# Patient Record
Sex: Male | Born: 2000 | Race: Black or African American | Hispanic: No | Marital: Single | Smoking: Current every day smoker
Health system: Southern US, Community
[De-identification: ages and names within clinical notes are randomized; demographics above are authoritative.]

## PROBLEM LIST (undated history)

## (undated) DIAGNOSIS — F32A Depression, unspecified: Secondary | ICD-10-CM

## (undated) DIAGNOSIS — F29 Unspecified psychosis not due to a substance or known physiological condition: Secondary | ICD-10-CM

## (undated) DIAGNOSIS — Z046 Encounter for general psychiatric examination, requested by authority: Secondary | ICD-10-CM

## (undated) DIAGNOSIS — Z789 Other specified health status: Secondary | ICD-10-CM

## (undated) DIAGNOSIS — I10 Essential (primary) hypertension: Secondary | ICD-10-CM

## (undated) DIAGNOSIS — F909 Attention-deficit hyperactivity disorder, unspecified type: Secondary | ICD-10-CM

## (undated) HISTORY — PX: NO PAST SURGERIES: SHX2092

---
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
## 2018-08-01 ENCOUNTER — Other Ambulatory Visit: Payer: Self-pay

## 2018-08-01 ENCOUNTER — Encounter (HOSPITAL_COMMUNITY): Payer: Self-pay | Admitting: Emergency Medicine

## 2018-08-01 ENCOUNTER — Emergency Department (HOSPITAL_COMMUNITY)
Admission: EM | Admit: 2018-08-01 | Discharge: 2018-08-02 | Disposition: A | Discharge status: Home / Self Care | Payer: Medicaid Other | Attending: Emergency Medicine | Admitting: Emergency Medicine

## 2018-08-01 DIAGNOSIS — F17210 Nicotine dependence, cigarettes, uncomplicated: Secondary | ICD-10-CM | POA: Diagnosis not present

## 2018-08-01 DIAGNOSIS — F29 Unspecified psychosis not due to a substance or known physiological condition: Secondary | ICD-10-CM | POA: Insufficient documentation

## 2018-08-01 DIAGNOSIS — F333 Major depressive disorder, recurrent, severe with psychotic symptoms: Secondary | ICD-10-CM | POA: Diagnosis not present

## 2018-08-01 DIAGNOSIS — F909 Attention-deficit hyperactivity disorder, unspecified type: Secondary | ICD-10-CM | POA: Insufficient documentation

## 2018-08-01 DIAGNOSIS — F4325 Adjustment disorder with mixed disturbance of emotions and conduct: Secondary | ICD-10-CM | POA: Diagnosis present

## 2018-08-01 DIAGNOSIS — R4182 Altered mental status, unspecified: Secondary | ICD-10-CM | POA: Diagnosis present

## 2018-08-01 DIAGNOSIS — E876 Hypokalemia: Secondary | ICD-10-CM | POA: Diagnosis not present

## 2018-08-01 DIAGNOSIS — F1220 Cannabis dependence, uncomplicated: Secondary | ICD-10-CM | POA: Diagnosis not present

## 2018-08-01 DIAGNOSIS — Z634 Disappearance and death of family member: Secondary | ICD-10-CM | POA: Diagnosis not present

## 2018-08-01 LAB — RAPID URINE DRUG SCREEN, HOSP PERFORMED
Amphetamines: NOT DETECTED
BENZODIAZEPINES: NOT DETECTED
Barbiturates: NOT DETECTED
Cocaine: NOT DETECTED
Opiates: NOT DETECTED
Tetrahydrocannabinol: POSITIVE — AB

## 2018-08-01 LAB — CBC WITH DIFFERENTIAL/PLATELET
Basophils Absolute: 0 10*3/uL (ref 0.0–0.1)
Basophils Relative: 0 %
EOS ABS: 0 10*3/uL (ref 0.0–0.7)
EOS PCT: 0 %
HCT: 43 % (ref 39.0–52.0)
Hemoglobin: 14.7 g/dL (ref 13.0–17.0)
LYMPHS ABS: 0.8 10*3/uL (ref 0.7–4.0)
Lymphocytes Relative: 13 %
MCH: 29.6 pg (ref 26.0–34.0)
MCHC: 34.2 g/dL (ref 30.0–36.0)
MCV: 86.5 fL (ref 78.0–100.0)
Monocytes Absolute: 0.6 10*3/uL (ref 0.1–1.0)
Monocytes Relative: 10 %
Neutro Abs: 4.4 10*3/uL (ref 1.7–7.7)
Neutrophils Relative %: 77 %
PLATELETS: 185 10*3/uL (ref 150–400)
RBC: 4.97 MIL/uL (ref 4.22–5.81)
RDW: 12.7 % (ref 11.5–15.5)
WBC: 5.8 10*3/uL (ref 4.0–10.5)

## 2018-08-01 LAB — COMPREHENSIVE METABOLIC PANEL
ALT: 22 U/L (ref 0–44)
AST: 31 U/L (ref 15–41)
Albumin: 4.6 g/dL (ref 3.5–5.0)
Alkaline Phosphatase: 50 U/L (ref 38–126)
Anion gap: 16 — ABNORMAL HIGH (ref 5–15)
BUN: 11 mg/dL (ref 6–20)
CHLORIDE: 100 mmol/L (ref 98–111)
CO2: 23 mmol/L (ref 22–32)
CREATININE: 1.34 mg/dL — AB (ref 0.61–1.24)
Calcium: 9.3 mg/dL (ref 8.9–10.3)
GFR calc Af Amer: >60 mL/min (ref >60)
Glucose, Bld: 109 mg/dL — ABNORMAL HIGH (ref 70–99)
Potassium: 2.6 mmol/L — CL (ref 3.5–5.1)
Sodium: 139 mmol/L (ref 135–145)
Total Bilirubin: 1.1 mg/dL (ref 0.3–1.2)
Total Protein: 7.8 g/dL (ref 6.5–8.1)

## 2018-08-01 LAB — ETHANOL

## 2018-08-01 MED ORDER — LORAZEPAM 2 MG/ML IJ SOLN
2.0000 mg | Freq: Once | INTRAMUSCULAR | Status: DC
  Administered 2018-08-01: 2 mg via INTRAMUSCULAR
  Filled 2018-08-01: qty 1

## 2018-08-01 MED ORDER — POTASSIUM CHLORIDE CRYS ER 20 MEQ PO TBCR
40.0000 meq | EXTENDED_RELEASE_TABLET | Freq: Two times a day (BID) | ORAL | Status: DC
  Administered 2018-08-02: 40 meq via ORAL
  Filled 2018-08-01: qty 2

## 2018-08-01 MED ORDER — LORAZEPAM 2 MG/ML IJ SOLN: 1.0000 mg | Freq: Once | INTRAMUSCULAR | Status: DC

## 2018-08-01 NOTE — ED Notes (Signed)
Patient wanded by security. 
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
## 2018-08-01 NOTE — ED Notes (Signed)
Bed: ZMO29 Expected date:  Expected time:  Means of arrival:  Comments: [place]ucker

## 2018-08-01 NOTE — ED Triage Notes (Signed)
Patient here from home with complaints of bizarre behavior after going to the viewing of both of his brothers. Hx of same after smoking "some bad weed". Patient not making sense. Flight of ideas.
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
## 2018-08-01 NOTE — ED Notes (Signed)
Pt sleeping and unable to take medication at this time. Will give medication when pt wakes.

## 2018-08-01 NOTE — ED Notes (Signed)
Bed: [facility] Expected date:  Expected time:  Means of arrival:  Comments: 

## 2018-08-01 NOTE — ED Notes (Signed)
SBAR Report received from previous nurse. Pt received calm and visible on unit. Pt denies current SI/ HI, A/V H, anxiety, or pain at this time, and appears otherwise stable and free of distress. Pt showing blunted affect. Pt reminded of camera surveillance, q 15 min rounds, and rules of the milieu. Will continue to assess.

## 2018-08-02 DIAGNOSIS — F4325 Adjustment disorder with mixed disturbance of emotions and conduct: Secondary | ICD-10-CM

## 2018-08-02 DIAGNOSIS — F17210 Nicotine dependence, cigarettes, uncomplicated: Secondary | ICD-10-CM

## 2018-08-02 DIAGNOSIS — Z634 Disappearance and death of family member: Secondary | ICD-10-CM

## 2018-08-02 HISTORY — DX: Adjustment disorder with mixed disturbance of emotions and conduct: F43.25

## 2018-08-02 NOTE — Consult Note (Addendum)
[facility] Psych ED Discharge  08/02/2018 10:58 AM Gabriel Nelson  MRN:  [id number] Principal Problem: Adjustment disorder with mixed disturbance of emotions and conduct Discharge Diagnoses:  Patient Active Problem List   Diagnosis Date Noted  . Adjustment disorder with mixed disturbance of emotions and conduct [F43.25] 08/02/2018    Priority: High  . Rhabdomyolysis [M62.82] 10/27/2016  . Marijuana intoxication ([place]) [F12.929] 10/27/2016  . Osteomyelitis [facility]) [M86.9] 10/23/2015    Subjective: 17 yo male who came to the ED after not sleeping since his brother was killed 5 days ago and having bizarre behaviors.  Last night his slept and his symptoms resolved, no psychosis.  NO suicidal/homicidal ideations or substance abuse except cannabis.  Met with Peer support.  Stable for discharge.  Total Time spent with patient: 45 minutes  Past Psychiatric History: ADHD  Past Medical History:  Past Medical History:  Diagnosis Date  . ADHD    History reviewed. No pertinent surgical history. Family History: No family history on file. Family Psychiatric  History: none Social History:  Social History   Substance and Sexual Activity  Alcohol Use No    Social History   Substance and Sexual Activity  Drug Use No   Social History   Socioeconomic History  . Marital status: Single    Spouse name: Not on file  . Number of children: Not on file  . Years of education: Not on file  . Highest education level: Not on file  Occupational History  . Not on file  Social Needs  . Financial resource strain: Not on file  . Food insecurity:    Worry: Not on file    Inability: Not on file  . Transportation needs:    Medical: Not on file    Non-medical: Not on file  Tobacco Use  . Smoking status: Current Every Day Smoker    Packs/day: 0.25    Years: 4.00    Pack years: 1.00    Types: Cigarettes  . Smokeless tobacco: Never Used  Substance and Sexual Activity  . Alcohol use: No  . Drug use: No  .  Sexual activity: Not on file    Comment: uta, patient not fully oriented  Lifestyle  . Physical activity:    Days per week: Not on file    Minutes per session: Not on file  . Stress: Not on file  Relationships  . Social connections:    Talks on phone: Not on file    Gets together: Not on file    Attends religious service: Not on file    Active member of club or organization: Not on file    Attends meetings of clubs or organizations: Not on file    Relationship status: Not on file  Other Topics Concern  . Not on file  Social History Narrative  . Not on file    Has this patient used any form of tobacco in the last 30 days? (Cigarettes, Smokeless Tobacco, Cigars, and/or Pipes) Denies  Current Medications: Current Facility-Administered Medications  Medication Dose Route Frequency Provider Last Rate Last Dose  . potassium chloride SA (K-DUR,KLOR-CON) CR tablet 40 mEq  40 mEq Oral BID Davonna Belling, MD   40 mEq at 08/02/18 [id number]   Current Outpatient Medications  Medication Sig Dispense Refill  . diphenhydrAMINE (BENADRYL) 25 mg capsule Take 50 mg by mouth every 6 (six) hours as needed.     PTA Medications:  (Not in a hospital admission)  Musculoskeletal: Strength & Muscle Tone: within  normal limits Gait & Station: normal Patient leans: N/A  Psychiatric Specialty Exam: Physical Exam  Nursing note and vitals reviewed. Constitutional: He is oriented to person, place, and time. He appears well-developed and well-nourished.  HENT:  Head: Normocephalic.  Neck: Normal range of motion.  Respiratory: Effort normal.  Musculoskeletal: Normal range of motion.  Neurological: He is alert and oriented to person, place, and time.  Psychiatric: He has a normal mood and affect. His speech is normal and behavior is normal. Judgment and thought content normal. Cognition and memory are normal.    Review of Systems  Psychiatric/Behavioral: Positive for substance abuse.  All other  systems reviewed and are negative.   Blood pressure (!) 143/93, pulse (!) 54, temperature 98.2 F (36.8 C), temperature source Oral, resp. rate 18, SpO2 99 %.There is no height or weight on file to calculate BMI.  General Appearance: Casual  Eye Contact:  Good  Speech:  Normal Rate  Volume:  Normal  Mood:  Euthymic  Affect:  Congruent  Thought Process:  Coherent and Descriptions of Associations: Intact  Orientation:  Full (Time, Place, and Person)  Thought Content:  WDL and Logical  Suicidal Thoughts:  No  Homicidal Thoughts:  No  Memory:  Immediate;   Good Recent;   Good Remote;   Good  Judgement:  Fair  Insight:  Good  Psychomotor Activity:  Normal  Concentration:  Concentration: Good and Attention Span: Good  Recall:  Good  Fund of Knowledge:  Good  Language:  Good  Akathisia:  No  Handed:  Right  AIMS (if indicated):     Assets:  Housing Leisure Time Physical Health Resilience Social Support  ADL's:  Intact  Cognition:  WNL  Sleep:        Demographic Factors:  Male and Adolescent or young adult  Loss Factors: NA  Historical Factors: NA  Risk Reduction Factors:   Sense of responsibility to family, Living with another person, especially a relative and Positive social support  Continued Clinical Symptoms:  None   Cognitive Features That Contribute To Risk:  None    Suicide Risk:  Minimal: No identifiable suicidal ideation.  Patients presenting with no risk factors but with morbid ruminations; may be classified as minimal risk based on the severity of the depressive symptoms    Plan Of Care/Follow-up recommendations:  Activity:  as tolerated Diet:  heart healthy diet  Disposition: discharge home Waylan Boga, NP 08/02/2018, 10:58 AM  Patient seen face-to-face for psychiatric evaluation, chart reviewed and case discussed with the physician extender and developed treatment plan. Reviewed the information documented and agree with the treatment  plan. Corena Pilgrim, MD
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
## 2018-08-04 ENCOUNTER — Encounter (HOSPITAL_COMMUNITY): Payer: Self-pay | Admitting: Family Medicine

## 2018-08-04 ENCOUNTER — Emergency Department (HOSPITAL_COMMUNITY): Payer: Medicaid Other

## 2018-08-04 ENCOUNTER — Emergency Department (HOSPITAL_COMMUNITY)
Admission: EM | Admit: 2018-08-04 | Discharge: 2018-08-05 | Disposition: A | Discharge status: Home / Self Care | Payer: Medicaid Other | Attending: Emergency Medicine | Admitting: Emergency Medicine

## 2018-08-04 DIAGNOSIS — F4325 Adjustment disorder with mixed disturbance of emotions and conduct: Secondary | ICD-10-CM | POA: Diagnosis not present

## 2018-08-04 DIAGNOSIS — F17290 Nicotine dependence, other tobacco product, uncomplicated: Secondary | ICD-10-CM | POA: Insufficient documentation

## 2018-08-04 DIAGNOSIS — F17210 Nicotine dependence, cigarettes, uncomplicated: Secondary | ICD-10-CM | POA: Diagnosis not present

## 2018-08-04 DIAGNOSIS — R45850 Homicidal ideations: Secondary | ICD-10-CM | POA: Insufficient documentation

## 2018-08-04 DIAGNOSIS — F439 Reaction to severe stress, unspecified: Secondary | ICD-10-CM | POA: Diagnosis present

## 2018-08-04 DIAGNOSIS — F12921 Cannabis use, unspecified with intoxication delirium: Secondary | ICD-10-CM | POA: Insufficient documentation

## 2018-08-04 LAB — URINALYSIS, ROUTINE W REFLEX MICROSCOPIC
Bacteria, UA: NONE SEEN
Glucose, UA: NEGATIVE mg/dL
Hgb urine dipstick: NEGATIVE
KETONES UR: 5 mg/dL — AB
Leukocytes, UA: NEGATIVE
Nitrite: NEGATIVE
PH: 5 (ref 5.0–8.0)
Protein, ur: >=300 mg/dL — AB
Specific Gravity, Urine: 1.034 — ABNORMAL HIGH (ref 1.005–1.030)

## 2018-08-04 LAB — COMPREHENSIVE METABOLIC PANEL
ALK PHOS: 51 U/L (ref 38–126)
ALT: 29 U/L (ref 0–44)
AST: 43 U/L — ABNORMAL HIGH (ref 15–41)
Albumin: 4.7 g/dL (ref 3.5–5.0)
Anion gap: 14 (ref 5–15)
BILIRUBIN TOTAL: 1.2 mg/dL (ref 0.3–1.2)
BUN: 8 mg/dL (ref 6–20)
CALCIUM: 9.3 mg/dL (ref 8.9–10.3)
CHLORIDE: 102 mmol/L (ref 98–111)
CO2: 26 mmol/L (ref 22–32)
CREATININE: 1.15 mg/dL (ref 0.61–1.24)
GFR calc Af Amer: >60 mL/min (ref >60)
Glucose, Bld: 87 mg/dL (ref 70–99)
Potassium: 3.3 mmol/L — ABNORMAL LOW (ref 3.5–5.1)
Sodium: 142 mmol/L (ref 135–145)
TOTAL PROTEIN: 7.8 g/dL (ref 6.5–8.1)

## 2018-08-04 LAB — CBC WITH DIFFERENTIAL/PLATELET
BASOS PCT: 1 %
Basophils Absolute: 0 10*3/uL (ref 0.0–0.1)
Eosinophils Absolute: 0.1 10*3/uL (ref 0.0–0.7)
Eosinophils Relative: 1 %
HEMATOCRIT: 44.9 % (ref 39.0–52.0)
HEMOGLOBIN: 15.6 g/dL (ref 13.0–17.0)
LYMPHS PCT: 32 %
Lymphs Abs: 1.9 10*3/uL (ref 0.7–4.0)
MCH: 29.8 pg (ref 26.0–34.0)
MCHC: 34.7 g/dL (ref 30.0–36.0)
MCV: 85.7 fL (ref 78.0–100.0)
MONOS PCT: 13 %
Monocytes Absolute: 0.8 10*3/uL (ref 0.1–1.0)
NEUTROS ABS: 3.2 10*3/uL (ref 1.7–7.7)
Neutrophils Relative %: 53 %
Platelets: 217 10*3/uL (ref 150–400)
RBC: 5.24 MIL/uL (ref 4.22–5.81)
RDW: 12.4 % (ref 11.5–15.5)
WBC: 6 10*3/uL (ref 4.0–10.5)

## 2018-08-04 LAB — RAPID URINE DRUG SCREEN, HOSP PERFORMED
Amphetamines: NOT DETECTED
BARBITURATES: POSITIVE — AB
Benzodiazepines: NOT DETECTED
COCAINE: NOT DETECTED
Opiates: NOT DETECTED
TETRAHYDROCANNABINOL: POSITIVE — AB

## 2018-08-04 LAB — SALICYLATE LEVEL: Salicylate Lvl: <7 mg/dL (ref 2.8–30.0)

## 2018-08-04 LAB — ETHANOL

## 2018-08-04 LAB — ACETAMINOPHEN LEVEL: Acetaminophen (Tylenol), Serum: <10 ug/mL — ABNORMAL LOW (ref 10–30)

## 2018-08-04 MED ORDER — ACETAMINOPHEN 325 MG PO TABS: 650.0000 mg | ORAL_TABLET | Freq: Once | ORAL | Status: DC

## 2018-08-04 MED ORDER — POTASSIUM CHLORIDE CRYS ER 20 MEQ PO TBCR
40.0000 meq | EXTENDED_RELEASE_TABLET | Freq: Once | ORAL | Status: AC
  Administered 2018-08-05: 40 meq via ORAL
  Filled 2018-08-04: qty 2

## 2018-08-04 MED ORDER — STERILE WATER FOR INJECTION IJ SOLN
INTRAMUSCULAR | Status: AC
  Filled 2018-08-04: qty 10

## 2018-08-04 MED ORDER — LORAZEPAM 2 MG/ML IJ SOLN
2.0000 mg | Freq: Once | INTRAMUSCULAR | Status: AC
  Administered 2018-08-04: 2 mg via INTRAMUSCULAR
  Filled 2018-08-04: qty 1

## 2018-08-04 MED ORDER — ZIPRASIDONE MESYLATE 20 MG IM SOLR
20.0000 mg | Freq: Once | INTRAMUSCULAR | Status: DC
  Filled 2018-08-04: qty 20

## 2018-08-04 MED ORDER — HALOPERIDOL LACTATE 5 MG/ML IJ SOLN
5.0000 mg | Freq: Once | INTRAMUSCULAR | Status: AC
  Administered 2018-08-04: 5 mg via INTRAMUSCULAR
  Filled 2018-08-04: qty 1

## 2018-08-04 MED ORDER — LORAZEPAM 2 MG/ML IJ SOLN
2.0000 mg | Freq: Once | INTRAMUSCULAR | Status: DC
  Filled 2018-08-04: qty 1

## 2018-08-04 NOTE — ED Provider Notes (Signed)
[facility]-EMERGENCY DEPT Provider Note   CSN: [id number] Arrival date & time: 08/04/18  1313  History   Chief Complaint Chief Complaint  Patient presents with  . Psychiatric Evaluation    HPI Gabriel Nelson is a 17 y.o. male.  The history is provided by the patient and the police.  Mental Health Problem  Presenting symptoms: bizarre behavior, disorganized thought process and homicidal ideas   Presenting symptoms: no agitation, no suicidal thoughts and no suicidal threats   Patient accompanied by:  Law enforcement Degree of incapacity (severity):  Moderate Onset quality:  Gradual Timing:  Constant Progression:  Unchanged Chronicity:  New Context: drug abuse and stressful life event (recent death of friend/brother)   Treatment compliance:  Untreated Relieved by:  Nothing Worsened by:  Family interactions Associated symptoms: irritability and poor judgment   Associated symptoms: no abdominal pain and no chest pain   Risk factors: hx of mental illness     Past Medical History:  Diagnosis Date  . ADHD     Patient Active Problem List   Diagnosis Date Noted  . 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 Rhabdomyolysis 10/27/2016  . Marijuana intoxication (HCC) 10/27/2016  . Osteomyelitis (HCC) 10/23/2015    History reviewed. No pertinent surgical history.      Home Medications    Prior to Admission medications   Not on File    Family History History reviewed. No pertinent family history.  Social History Social History   Tobacco Use  . Smoking status: Current Every Day Smoker    Packs/day: 0.25    Years: 4.00    Pack years: 1.00    Types: Cigarettes, Cigars  . Smokeless tobacco: Never Used  Substance Use Topics  . Alcohol use: Yes    Comment: He states occassionally, reports last beer was when his brother died.   . Drug use: Yes    Types: Marijuana    Comment: Daily. Last used: yesterday       Allergies   Patient has no known allergies.   Review of Systems Review of Systems  Constitutional: Positive for irritability. Negative for chills and fever.  HENT: Negative for ear pain and sore throat.   Eyes: Negative for pain and visual disturbance.  Respiratory: Negative for cough and shortness of breath.   Cardiovascular: Negative for chest pain and palpitations.  Gastrointestinal: Negative for abdominal pain and vomiting.  Genitourinary: Negative for dysuria and hematuria.  Musculoskeletal: Negative for arthralgias and back pain.  Skin: Negative for color change and rash.  Neurological: Negative for seizures and syncope.  Psychiatric/Behavioral: Positive for homicidal ideas. Negative for agitation, confusion and suicidal ideas. The patient is hyperactive.   All other systems reviewed and are negative.    Physical Exam Updated Vital Signs  ED Triage Vitals  Enc Vitals Group     BP 08/04/18 1340 (!) 146/105     Pulse Rate 08/04/18 1340 90     Resp 08/04/18 1340 18     Temp 08/04/18 1340 (!) 100.4 F (38 C)     Temp Source 08/04/18 1340 Oral     SpO2 08/04/18 1340 98 %     Weight --      Height --      Head Circumference --      Peak Flow --      Pain Score 08/04/18 1341 0     Pain Loc --      Pain Edu? --  Excl. in GC? --     Physical Exam  Constitutional: He is oriented to person, place, and time. He appears well-developed and well-nourished.  HENT:  Head: Normocephalic and atraumatic.  Eyes: Pupils are equal, round, and reactive to light. Conjunctivae and EOM are normal.  Neck: Normal range of motion. Neck supple.  Cardiovascular: Normal rate, regular rhythm, normal heart sounds and intact distal pulses.  No murmur heard. Pulmonary/Chest: Effort normal and breath sounds normal. No respiratory distress.  Abdominal: Soft. There is no tenderness.  Musculoskeletal: Normal range of motion. He exhibits no edema.  Neurological: He is alert and oriented  to person, place, and time.  Skin: Skin is warm and dry.  Psychiatric: His affect is labile. His speech is tangential. He is hyperactive. He is not actively hallucinating. He expresses impulsivity. He expresses homicidal ideation. He expresses no suicidal ideation. He expresses no suicidal plans and no homicidal plans. He is inattentive.  Nursing note and vitals reviewed.    ED Treatments / Results  Labs (all labs ordered are listed, but only abnormal results are displayed) Labs Reviewed  COMPREHENSIVE METABOLIC PANEL - Abnormal; Notable for the following components:      Result Value   Potassium 3.3 (*)    AST 43 (*)    All other components within normal limits  ACETAMINOPHEN LEVEL - Abnormal; Notable for the following components:   Acetaminophen (Tylenol), Serum <10 (*)    All other components within normal limits  RAPID URINE DRUG SCREEN, HOSP PERFORMED - Abnormal; Notable for the following components:   Tetrahydrocannabinol POSITIVE (*)    Barbiturates POSITIVE (*)    All other components within normal limits  ETHANOL  SALICYLATE LEVEL  CBC WITH DIFFERENTIAL/PLATELET  URINALYSIS, ROUTINE W REFLEX MICROSCOPIC    EKG None  Radiology Dg Chest Portable 1 View  Result Date: 08/04/2018 CLINICAL DATA:  Altered mental status. EXAM: PORTABLE CHEST 1 VIEW COMPARISON:  None. FINDINGS: Lungs are clear. Heart size is normal. No pneumothorax or pleural effusion. No acute or focal bony abnormality. IMPRESSION: Negative chest. Electronically Signed   By: Drusilla Kanner M.D.   On: 08/04/2018 14:41    Procedures Procedures (including critical care time)  Medications Ordered in ED Medications  potassium chloride SA (K-DUR,KLOR-CON) CR tablet 40 mEq (has no administration in time range)  acetaminophen (TYLENOL) tablet 650 mg (has no administration in time range)  haloperidol lactate (HALDOL) injection 5 mg (5 mg Intramuscular Given 08/04/18 1448)  LORazepam (ATIVAN) injection 2 mg (2 mg  Intramuscular Given 08/04/18 1446)     Initial Impression / Assessment and Plan / ED Course  I have reviewed the triage vital signs and the nursing notes.  Pertinent labs & imaging results that were available during my care of the patient were reviewed by me and considered in my medical decision making (see chart for details).     Gabriel Nelson is an 17 year old male with no significant medical history who presents to the ED with IVC in place.  Patient with fever of 100.4 but otherwise normal vitals.  Patient denies any cough, sputum production, abdominal pain, dysuria.  Has not noticed if he had a fever.  Patient with no neck pain, no neck stiffness.  No signs to suggest meningitis.  Patient likely with viral process.  Patient has IVC filled as he has been endorsing homicidal ideation and some manic behavior which she has exhibited in the past but is not on any medication anymore.  Patient with recent stressor  of recent death of his brother and close friend.  He was in the ED several nights ago for the same but today has IVC filled out.  He appears to have some tangential speech but is easily redirectable.  He is endorsing homicidal ideation but no suicidal ideation.  Patient is overall fairly rational but appears to be acutely upset and possibly homicidal.  Possibly some mania as well which apparently has hx of same.  Lab work showed no significant anemia, electrolyte abnormality, no leukocytosis.  Chest x-ray showed no pneumonia.  No urinary symptoms.  No concern for infectious process.  Possibly viral process, from agitation.  Patient positive for marijuana.  Patient was given IM Haldol and Ativan as he was not compliant with blood draw.  Hemodynamically stable to my care.  Given Tylenol for fever.  Awaiting final psychiatric disposition.  IVC is in place.  Final Clinical Impressions(s) / ED Diagnoses   Final diagnoses:  Homicidal behavior    ED Discharge Orders    None       Virgina Norfolk, DO 08/04/18 1629

## 2018-08-04 NOTE — BH Assessment (Signed)
[facility] Assessment Progress Note  Case was staffed with Reita Cliche DNP who stated patient met criteria for a inpatient admission as appropriate bed placement is investigated.

## 2018-08-04 NOTE — ED Provider Notes (Signed)
Pt with increasing agitation at the bedside. Receiving haldol and ativan by nursing staff now. Will need psychiatric evaluation   Azalia Bilis, MD 08/04/18 1451

## 2018-08-04 NOTE — ED Notes (Signed)
Patients belongings has been removed from him and placed in one white belongings bag. Patient has been wanded by security.
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
## 2018-08-04 NOTE — ED Triage Notes (Signed)
Patient has been IVC'd by Margie Ege, family friend, and transported via [employer]. Patient's IVC paper state he has been previously committed to [facility] and was recently at [facility] on August 01, 2018. From reading IVC papers, patient has been put out of his house by his family. He recently lost a brother and friend. Family friend reports patient has been acting erratically and randomly screams or laughs. Also, he is talking to people who are not there. When triaging, patient is talking out loud to unknown persons. He denied being suicidal but reports he is homicidal. He would disclose who he was homicidal against. IVC papers states he knows who killed his brother and he wanted to kill them.

## 2018-08-05 DIAGNOSIS — F4325 Adjustment disorder with mixed disturbance of emotions and conduct: Secondary | ICD-10-CM

## 2018-08-05 DIAGNOSIS — F17210 Nicotine dependence, cigarettes, uncomplicated: Secondary | ICD-10-CM

## 2018-08-05 NOTE — BH Assessment (Addendum)
[facility] Assessment Progress Note  Per Juanetta Beets, DO, this pt does not require psychiatric hospitalization at this time.  Pt presents under IVC initiated by a friend of the family, which Dr Sharma Covert has rescinded.  Pt is to be discharged from [facility] with recommendation to follow up with [facility] for grief counseling  This has been included in pt's discharge instructions.  Wolfgang Phoenix, TS agrees to call pt's mother to notify her and to ask her to remove firearms from the household.  Pt's nurse, Addison Naegeli, has been notified.  Doylene Canning, MA Triage Specialist [phone]

## 2018-08-05 NOTE — Discharge Instructions (Signed)
For your behavioral health needs, you are advised to follow up with [facility].  They offer grief counseling.  Contact them at your earliest opportunity to ask about their program:       [facility]      [address]      [place], [place] [address]      [phone]

## 2018-08-05 NOTE — Consult Note (Addendum)
[facility] Psych ED Discharge  08/05/2018 11:09 AM Gabriel Nelson  MRN:  [id number] Principal Problem: Adjustment disorder with mixed disturbance of emotions and conduct Discharge Diagnoses:  Patient Active Problem List   Diagnosis Date Noted  . Adjustment disorder with mixed disturbance of emotions and conduct [F43.25] 08/02/2018  . Rhabdomyolysis [M62.82] 10/27/2016  . Marijuana intoxication (HCC) [F12.929] 10/27/2016  . Osteomyelitis [facility]) [M86.9] 10/23/2015  ID: 17 year old male who lives with his step mother. He is not dating and has no children.   CC: over taken anger.   WUJ:WJXBJY Gabriel Nelson is an 17 y.o. male that presents this date with IVC. Per IVC: "[name] has no mental health diagnosis but has previously been committed to [facility] and more recently [facility] on Friday night. [name] has been put out of his house by family. [name] is suffering from grief given his friend and brother were killed in separate incidents. [name] is expressing the desire to harm whoever killed his family member and friend. [name] randomly screams and laughs at nothing and is talking to people who are not there. [name] seems to be responding to internal stimuli. [name] continues to regress and act erratically." Patient presents this date and is observed to be highly agitated and is pacing around his room hitting objects. Patient is threatening staff and [employer] officers who are present. Patient screams at this writer to get out of his room. This writer attempts to conduct assessment several times and redirect patient unsuccessfully. When this writer attempts to ask question patient is very tangential and seems to be responding to internal stimuli as evidenced by patient stating "Boy I told them that I was here doing what you said to do." Patient is hitting his fist into the palm of his hand and shakes his fist at this Clinical research associate. Patient is refusing to participate in the assessment process. Information  to complete assessment was obtained from admission notes this date and history. Per notes, "Patient has been IVC'd by Margie Ege, family friend, and transported via [employer]. Patient's IVC paper state he has been previously committed to [facility] and was recently at [facility] on August 01, 2018. From reading IVC papers, patient has been put out of his house by his family. He recently lost a brother and friend. Family friend reports patient has been acting erratically and randomly screams or laughs. Also, he is talking to people who are not there. When triaging, patient is talking out loud to unknown persons. He denied being suicidal but reports he is homicidal. He would not disclose who he was homicidal against. Per note review on 08/01/18, Patients medical record indicates patient has a history of using"K2" synthetic cannabis and had a reactionwith symptoms including becoming non-verbal, tremors and staring blankly for sometimes hours at a time. Patient was hospitalized for similar symptoms in [place] in 2015 and twice in [place] in 10/2016 and 11/2016, all related to synthetic cannabis use".UDS is pending this date.   Subjective: 17 yo male who came to the ED after not sleeping since his brother was killed 1 week ago and having bizarre behaviors. Last night he slept and his symptoms resolved, no psychosis.  No suicidal ideations, contracts for safety. He does endorse retaliation against persons who killed his brother. He does not have a plan, and he does not know who killed his brother.  Case was discussed with [employer], who performed background check. Patient was cleared and no pending warrants at this time, he is not  a person of interest regarding recent murders in local area. He has hx of substance abuse, reports recently using marijuana, Percocet, and Lean.   Total Time spent with patient: 30 minutes  Past Psychiatric History: ADHD  Past Medical History:  Past Medical History:   Diagnosis Date  . ADHD    History reviewed. No pertinent surgical history. Family History: History reviewed. No pertinent family history. Family Psychiatric  History: none Social History:  Social History   Substance and Sexual Activity  Alcohol Use Yes   Comment: He states occassionally, reports last beer was when his brother died.      Social History   Substance and Sexual Activity  Drug Use Yes  . Types: Marijuana   Comment: Daily. Last used: yesterday     Social History   Socioeconomic History  . Marital status: Single    Spouse name: Not on file  . Number of children: Not on file  . Years of education: Not on file  . Highest education level: Not on file  Occupational History  . Not on file  Social Needs  . Financial resource strain: Not on file  . Food insecurity:    Worry: Not on file    Inability: Not on file  . Transportation needs:    Medical: Not on file    Non-medical: Not on file  Tobacco Use  . Smoking status: Current Every Day Smoker    Packs/day: 0.25    Years: 4.00    Pack years: 1.00    Types: Cigarettes, Cigars  . Smokeless tobacco: Never Used  Substance and Sexual Activity  . Alcohol use: Yes    Comment: He states occassionally, reports last beer was when his brother died.   . Drug use: Yes    Types: Marijuana    Comment: Daily. Last used: yesterday   . Sexual activity: Not on file    Comment: uta, patient not fully oriented  Lifestyle  . Physical activity:    Days per week: Not on file    Minutes per session: Not on file  . Stress: Not on file  Relationships  . Social connections:    Talks on phone: Not on file    Gets together: Not on file    Attends religious service: Not on file    Active member of club or organization: Not on file    Attends meetings of clubs or organizations: Not on file    Relationship status: Not on file  Other Topics Concern  . Not on file  Social History Narrative  . Not on file    Has this patient  used any form of tobacco in the last 30 days? (Cigarettes, Smokeless Tobacco, Cigars, and/or Pipes) Denies  Current Medications: Current Facility-Administered Medications  Medication Dose Route Frequency Provider Last Rate Last Dose  . acetaminophen (TYLENOL) tablet 650 mg  650 mg Oral Once Virgina Norfolk, DO       No current outpatient medications on file.   PTA Medications:  (Not in a hospital admission)  Musculoskeletal: Strength & Muscle Tone: within normal limits Gait & Station: normal Patient leans: N/A  Psychiatric Specialty Exam: Physical Exam  Nursing note and vitals reviewed. Constitutional: He is oriented to person, place, and time. He appears well-developed and well-nourished.  HENT:  Head: Normocephalic and atraumatic.  Neck: Normal range of motion.  Respiratory: Effort normal.  Musculoskeletal: Normal range of motion.  Neurological: He is alert and oriented to person, place, and time.  Psychiatric: He has a normal mood and affect. His speech is normal and behavior is normal. Judgment and thought content normal. Cognition and memory are normal.    Review of Systems  Psychiatric/Behavioral: Positive for substance abuse.  All other systems reviewed and are negative.   Blood pressure 126/86, pulse 87, temperature 97.8 F (36.6 C), temperature source Oral, resp. rate 16, SpO2 99 %.There is no height or weight on file to calculate BMI.  General Appearance: Casual  Eye Contact:  Good  Speech:  Normal Rate  Volume:  Normal  Mood:  Euthymic  Affect:  Congruent  Thought Process:  Coherent and Descriptions of Associations: Intact  Orientation:  Full (Time, Place, and Person)  Thought Content:  WDL and Logical  Suicidal Thoughts:  No  Homicidal Thoughts:  No  Memory:  Immediate;   Good Recent;   Good Remote;   Good  Judgement:  Fair  Insight:  Good  Psychomotor Activity:  Normal  Concentration:  Concentration: Good and Attention Span: Good  Recall:  Good  Fund  of Knowledge:  Good  Language:  Good  Akathisia:  No  Handed:  Right  AIMS (if indicated):   N/A  Assets:  Housing Leisure Time Physical Health Resilience Social Support  ADL's:  Intact  Cognition:  WNL  Sleep:   Okay     Demographic Factors:  Male and Adolescent or young adult  Loss Factors: NA  Historical Factors: Anniversary of important loss and Impulsivity  Risk Reduction Factors:   Sense of responsibility to family, Living with another person, especially a relative and Positive social support  Continued Clinical Symptoms:  None   Cognitive Features That Contribute To Risk:  None    Suicide Risk:  Minimal: No identifiable suicidal ideation.  Patients presenting with no risk factors but with morbid ruminations; may be classified as minimal risk based on the severity of the depressive symptoms    Plan Of Care/Follow-up recommendations:  Activity:  as tolerated Diet:  heart healthy diet  Disposition: discharge home. Provided with resources for grief counseling.  Truman Hayward, FNP 08/05/2018, 11:09 AM   Patient seen face-to-face for psychiatric evaluation, chart reviewed and case discussed with the physician extender and developed treatment plan. Reviewed the information documented and agree with the treatment plan.  Juanetta Beets, DO 08/05/18 6:06 PM

## 2018-08-06 ENCOUNTER — Other Ambulatory Visit: Payer: Self-pay

## 2018-08-06 ENCOUNTER — Emergency Department (HOSPITAL_COMMUNITY)
Admission: EM | Admit: 2018-08-06 | Discharge: 2018-08-07 | Disposition: A | Discharge status: Other Acute Inpatient Hospital | Payer: Medicaid Other | Attending: Emergency Medicine | Admitting: Emergency Medicine

## 2018-08-06 ENCOUNTER — Encounter (HOSPITAL_COMMUNITY): Payer: Self-pay | Admitting: Emergency Medicine

## 2018-08-06 DIAGNOSIS — F989 Unspecified behavioral and emotional disorders with onset usually occurring in childhood and adolescence: Secondary | ICD-10-CM | POA: Insufficient documentation

## 2018-08-06 DIAGNOSIS — F4325 Adjustment disorder with mixed disturbance of emotions and conduct: Secondary | ICD-10-CM | POA: Insufficient documentation

## 2018-08-06 DIAGNOSIS — Z008 Encounter for other general examination: Secondary | ICD-10-CM

## 2018-08-06 DIAGNOSIS — F909 Attention-deficit hyperactivity disorder, unspecified type: Secondary | ICD-10-CM | POA: Insufficient documentation

## 2018-08-06 DIAGNOSIS — F17210 Nicotine dependence, cigarettes, uncomplicated: Secondary | ICD-10-CM | POA: Insufficient documentation

## 2018-08-06 NOTE — ED Notes (Signed)
Bed: [facility] Expected date:  Expected time:  Means of arrival:  Comments: 

## 2018-08-06 NOTE — ED Triage Notes (Addendum)
Patient came in by [employer]. Patient was taking all the patient friends mom stuff in the back yard and was destroying it. Patient will not tell me why he is here. He is not si or hi. Patient is brought in because he is not dealing with his friends death well.

## 2018-08-07 ENCOUNTER — Inpatient Hospital Stay (HOSPITAL_COMMUNITY)
Admission: AD | Admit: 2018-08-07 | Discharge: 2018-08-11 | DRG: 882 | Disposition: A | Discharge status: Home / Self Care | Payer: Medicaid Other | Source: Intra-hospital | Attending: Psychiatry | Admitting: Psychiatry

## 2018-08-07 ENCOUNTER — Emergency Department (HOSPITAL_COMMUNITY): Payer: Medicaid Other

## 2018-08-07 ENCOUNTER — Encounter (HOSPITAL_COMMUNITY): Payer: Self-pay

## 2018-08-07 DIAGNOSIS — F17210 Nicotine dependence, cigarettes, uncomplicated: Secondary | ICD-10-CM | POA: Diagnosis present

## 2018-08-07 DIAGNOSIS — G4700 Insomnia, unspecified: Secondary | ICD-10-CM | POA: Diagnosis present

## 2018-08-07 DIAGNOSIS — Z634 Disappearance and death of family member: Secondary | ICD-10-CM | POA: Diagnosis not present

## 2018-08-07 DIAGNOSIS — F4325 Adjustment disorder with mixed disturbance of emotions and conduct: Principal | ICD-10-CM | POA: Diagnosis present

## 2018-08-07 DIAGNOSIS — F1220 Cannabis dependence, uncomplicated: Secondary | ICD-10-CM

## 2018-08-07 DIAGNOSIS — F909 Attention-deficit hyperactivity disorder, unspecified type: Secondary | ICD-10-CM | POA: Diagnosis present

## 2018-08-07 DIAGNOSIS — Z23 Encounter for immunization: Secondary | ICD-10-CM | POA: Diagnosis not present

## 2018-08-07 DIAGNOSIS — F329 Major depressive disorder, single episode, unspecified: Secondary | ICD-10-CM | POA: Diagnosis present

## 2018-08-07 DIAGNOSIS — F419 Anxiety disorder, unspecified: Secondary | ICD-10-CM | POA: Diagnosis present

## 2018-08-07 DIAGNOSIS — Z590 Homelessness: Secondary | ICD-10-CM | POA: Diagnosis not present

## 2018-08-07 HISTORY — DX: Other specified health status: Z78.9

## 2018-08-07 LAB — CBC
HCT: 44.9 % (ref 39.0–52.0)
Hemoglobin: 15.7 g/dL (ref 13.0–17.0)
MCH: 30 pg (ref 26.0–34.0)
MCHC: 35 g/dL (ref 30.0–36.0)
MCV: 85.7 fL (ref 78.0–100.0)
PLATELETS: 208 10*3/uL (ref 150–400)
RBC: 5.24 MIL/uL (ref 4.22–5.81)
RDW: 12.5 % (ref 11.5–15.5)
WBC: 8.9 10*3/uL (ref 4.0–10.5)

## 2018-08-07 LAB — COMPREHENSIVE METABOLIC PANEL
ALT: 38 U/L (ref 0–44)
AST: 60 U/L — ABNORMAL HIGH (ref 15–41)
Albumin: 5.1 g/dL — ABNORMAL HIGH (ref 3.5–5.0)
Alkaline Phosphatase: 50 U/L (ref 38–126)
Anion gap: 13 (ref 5–15)
BUN: 14 mg/dL (ref 6–20)
CHLORIDE: 105 mmol/L (ref 98–111)
CO2: 26 mmol/L (ref 22–32)
CREATININE: 1.25 mg/dL — AB (ref 0.61–1.24)
Calcium: 10 mg/dL (ref 8.9–10.3)
Glucose, Bld: 85 mg/dL (ref 70–99)
Potassium: 3.3 mmol/L — ABNORMAL LOW (ref 3.5–5.1)
Sodium: 144 mmol/L (ref 135–145)
Total Bilirubin: 1.4 mg/dL — ABNORMAL HIGH (ref 0.3–1.2)
Total Protein: 8.4 g/dL — ABNORMAL HIGH (ref 6.5–8.1)

## 2018-08-07 LAB — RAPID URINE DRUG SCREEN, HOSP PERFORMED
AMPHETAMINES: NOT DETECTED
BENZODIAZEPINES: NOT DETECTED
Barbiturates: POSITIVE — AB
Cocaine: NOT DETECTED
Opiates: NOT DETECTED
Tetrahydrocannabinol: POSITIVE — AB

## 2018-08-07 LAB — SALICYLATE LEVEL

## 2018-08-07 LAB — ETHANOL

## 2018-08-07 LAB — ACETAMINOPHEN LEVEL: Acetaminophen (Tylenol), Serum: <10 ug/mL — ABNORMAL LOW (ref 10–30)

## 2018-08-07 MED ORDER — HYDROXYZINE HCL 25 MG PO TABS
25.0000 mg | ORAL_TABLET | Freq: Three times a day (TID) | ORAL | Status: DC | PRN
  Filled 2018-08-07: qty 5

## 2018-08-07 MED ORDER — OLANZAPINE 5 MG PO TBDP: 2.5000 mg | ORAL_TABLET | Freq: Two times a day (BID) | ORAL | Status: DC

## 2018-08-07 MED ORDER — ACETAMINOPHEN 325 MG PO TABS
650.0000 mg | ORAL_TABLET | Freq: Four times a day (QID) | ORAL | Status: DC | PRN
  Administered 2018-08-09 – 2018-08-10 (×2): 650 mg via ORAL
  Filled 2018-08-07 (×2): qty 2

## 2018-08-07 MED ORDER — INFLUENZA VAC SPLIT QUAD 0.5 ML IM SUSY
0.5000 mL | PREFILLED_SYRINGE | INTRAMUSCULAR | Status: AC
  Administered 2018-08-08: 0.5 mL via INTRAMUSCULAR
  Filled 2018-08-07: qty 0.5

## 2018-08-07 MED ORDER — OLANZAPINE 5 MG PO TBDP
2.5000 mg | ORAL_TABLET | Freq: Two times a day (BID) | ORAL | Status: DC
  Administered 2018-08-07 – 2018-08-08 (×2): 2.5 mg via ORAL
  Filled 2018-08-07 (×6): qty 0.5

## 2018-08-07 MED ORDER — ALUM & MAG HYDROXIDE-SIMETH 200-200-20 MG/5ML PO SUSP: 30.0000 mL | ORAL | Status: DC | PRN

## 2018-08-07 MED ORDER — TRAZODONE HCL 50 MG PO TABS
50.0000 mg | ORAL_TABLET | Freq: Every evening | ORAL | Status: DC | PRN
  Filled 2018-08-07: qty 5

## 2018-08-07 MED ORDER — PNEUMOCOCCAL VAC POLYVALENT 25 MCG/0.5ML IJ INJ
0.5000 mL | INJECTION | INTRAMUSCULAR | Status: AC
  Administered 2018-08-08: 0.5 mL via INTRAMUSCULAR

## 2018-08-07 MED ORDER — LORAZEPAM 1 MG PO TABS
2.0000 mg | ORAL_TABLET | Freq: Once | ORAL | Status: AC
  Administered 2018-08-07: 2 mg via ORAL
  Filled 2018-08-07: qty 2

## 2018-08-07 MED ORDER — MAGNESIUM HYDROXIDE 400 MG/5ML PO SUSP: 30.0000 mL | Freq: Every day | ORAL | Status: DC | PRN

## 2018-08-07 NOTE — BH Assessment (Signed)
[facility]  Per Juanetta BeetsJacqueline Norman, DO, this pt requires psychiatric hospitalization at this time.  Berneice Heinrichina Tate, RN, [id number].  Pt has signed Voluntary Admission and Consent for Treatment, as well as Consent to Release Information to pt's step-mother, and signed forms [facility].  Pt's nurse, Addison NaegeliDekina, has been notified, and agrees to send original paperwork along with pt via [facility], and to call report to [phone].  Doylene Canninghomas Margit Batte, [place] Behavioral Health Coordinator [phone]

## 2018-08-07 NOTE — Tx Team (Addendum)
Initial Treatment Plan 08/07/2018 9:16 PM Gabriel Nelson [id number]    PATIENT STRESSORS: Loss of brother in traumatic death Marital or family conflict Substance abuse   PATIENT STRENGTHS: Ability for insight Active sense of humor Average or above average intelligence   PATIENT IDENTIFIED PROBLEMS: "to help stay away from my brother's murderer."  "Help with theses thoughts to hurt my brother's murder."                   DISCHARGE CRITERIA:  Ability to meet basic life and health needs Adequate post-discharge living arrangements Improved stabilization in mood, thinking, and/or behavior  PRELIMINARY DISCHARGE PLAN: Attend aftercare/continuing care group Attend 12-step recovery group Outpatient therapy  PATIENT/FAMILY INVOLVEMENT: This treatment plan has been presented to and reviewed with the patient, Gabriel Nelson.  The patient and family have been given the opportunity to ask questions and make suggestions.  Jonetta SpeakAshton E Aryon Nham, RN 08/07/2018, 9:16 PM

## 2018-08-07 NOTE — Progress Notes (Addendum)
08/07/18  1653  Called [facility] [phone]. Report given to [facility] Willis ClinicElizabeth. Nolen MuCalled [facility] [phone].

## 2018-08-07 NOTE — ED Notes (Signed)
Bed: WA26 Expected date:  Expected time:  Means of arrival:  Comments: 

## 2018-08-07 NOTE — Progress Notes (Signed)
Patient presents with anxious/sad/pleasant on approach affect and behavior during admission interview and assessment. VS monitored and recorded. Skin check performed with [facility] and revealed multiple tattoos. Contraband was not found. Patient was oriented to unit and schedule. Pt states "I am here because my brother passed away. I know I'd be wanting to do something bad to the guy that killed him. I am here to be safe and not do anything bad.". Pt denies SI/HI/AVH at this time. PO fluids provided. Safety maintained. Rest encouraged.

## 2018-08-07 NOTE — ED Provider Notes (Signed)
[facility]-EMERGENCY DEPT Provider Note   CSN: [id number] Arrival date & time: 08/06/18  2349     History   Chief Complaint Chief Complaint  Patient presents with  . Medical Clearance    HPI Gabriel Nelson is a 17 y.o. male with history of ADHD, adjustment disorder, marijuana use who presents with [employer] for destruction of property.  Patient has been dealing with a lot lately regarding the death of his brother and friend.  He reports she was destroying property tonight because he was angry.  He admits to wearing a checking mask while he was doing it.  He denies any SI, HI, or hallucinations.  Patient was recently evaluated and given outpatient resources, for which she has not followed up with yet.  He admits to using marijuana today and daily.  He reports right wrist pain for the past 3 days.  He denies any other medical complaints.  HPI  Past Medical History:  Diagnosis Date  . ADHD     Patient Active Problem List   Diagnosis Date Noted  . 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 Rhabdomyolysis 10/27/2016  . Marijuana intoxication (HCC) 10/27/2016  . Osteomyelitis (HCC) 10/23/2015    History reviewed. No pertinent surgical history.      Home Medications    Prior to Admission medications   Not on File    Family History History reviewed. No pertinent family history.  Social History Social History   Tobacco Use  . Smoking status: Current Every Day Smoker    Packs/day: 0.25    Years: 4.00    Pack years: 1.00    Types: Cigarettes, Cigars  . Smokeless tobacco: Never Used  Substance Use Topics  . Alcohol use: Yes    Comment: He states occassionally, reports last beer was when his brother died.   . Drug use: Yes    Types: Marijuana    Comment: Daily. Last used: yesterday      Allergies   Patient has no known allergies.   Review of Systems Review of Systems  Constitutional: Negative for chills and  fever.  HENT: Negative for facial swelling and sore throat.   Respiratory: Negative for shortness of breath.   Cardiovascular: Negative for chest pain.  Gastrointestinal: Negative for abdominal pain, nausea and vomiting.  Genitourinary: Negative for dysuria.  Musculoskeletal: Positive for arthralgias. Negative for back pain.  Skin: Negative for rash and wound.  Neurological: Negative for headaches.  Psychiatric/Behavioral: The patient is not nervous/anxious.      Physical Exam Updated Vital Signs BP (!) 139/101 (BP Location: Left Arm)   Pulse 80   Temp 98 F (36.7 C) (Oral)   Resp 19   Ht 5\' 11"  (1.803 m)   Wt 85.3 kg   SpO2 100%   BMI 26.22 kg/m   Physical Exam  Constitutional: He appears well-developed and well-nourished. No distress.  HENT:  Head: Normocephalic and atraumatic.  Mouth/Throat: Oropharynx is clear and moist. No oropharyngeal exudate.  Eyes: Pupils are equal, round, and reactive to light. Conjunctivae are normal. Right eye exhibits no discharge. Left eye exhibits no discharge. No scleral icterus.  Neck: Normal range of motion. Neck supple. No thyromegaly present.  Cardiovascular: Normal rate, regular rhythm, normal heart sounds and intact distal pulses. Exam reveals no gallop and no friction rub.  No murmur heard. Pulmonary/Chest: Effort normal and breath sounds normal. No stridor. No respiratory distress. He has no wheezes. He has no rales.  Abdominal: Soft. Bowel sounds are normal. He exhibits no distension. There is no tenderness. There is no rebound and no guarding.  Musculoskeletal: He exhibits no edema.  Mild edema noted to the right wrist, full range of motion of the digits and the wrist; no erythema, mild tenderness  Lymphadenopathy:    He has no cervical adenopathy.  Neurological: He is alert. Coordination normal.  Skin: Skin is warm and dry. No rash noted. He is not diaphoretic. No pallor.  Psychiatric: He has a normal mood and affect.  Nursing  note and vitals reviewed.    ED Treatments / Results  Labs (all labs ordered are listed, but only abnormal results are displayed) Labs Reviewed  COMPREHENSIVE METABOLIC PANEL - Abnormal; Notable for the following components:      Result Value   Potassium 3.3 (*)    Creatinine, Ser 1.25 (*)    Total Protein 8.4 (*)    Albumin 5.1 (*)    AST 60 (*)    Total Bilirubin 1.4 (*)    All other components within normal limits  ACETAMINOPHEN LEVEL - Abnormal; Notable for the following components:   Acetaminophen (Tylenol), Serum <10 (*)    All other components within normal limits  RAPID URINE DRUG SCREEN, HOSP PERFORMED - Abnormal; Notable for the following components:   Tetrahydrocannabinol POSITIVE (*)    Barbiturates POSITIVE (*)    All other components within normal limits  ETHANOL  SALICYLATE LEVEL  CBC    EKG None  Radiology Dg Wrist Complete Right  Result Date: 08/07/2018 CLINICAL DATA:  Right wrist pain for 3 days.  No known injury. EXAM: RIGHT WRIST - COMPLETE 3+ VIEW COMPARISON:  None. FINDINGS: There is no evidence of fracture or dislocation. There is no evidence of arthropathy or other focal bone abnormality. Soft tissues are unremarkable. IMPRESSION: Negative radiographs of the right wrist. Electronically Signed   By: Narda RutherfordMelanie  Sanford M.D.   On: 08/07/2018 01:18    Procedures Procedures (including critical care time)  Medications Ordered in ED Medications - No data to display   Initial Impression / Assessment and Plan / ED Course  I have reviewed the triage vital signs and the nursing notes.  Pertinent labs & imaging results that were available during my care of the patient were reviewed by me and considered in my medical decision making (see chart for details).     Patient presenting after exhibiting destructive behavior.  He has been evaluated 2 other times in the past week by behavioral health. He is now a danger to others with destruction of property.   Patient complained of right wrist pain, x-rays negative.  Suspect sprain or contusion.  Labs unremarkable.  Patient is medically cleared.  TTS recommends AM psych eval to determine disposition.  Final Clinical Impressions(s) / ED Diagnoses   Final diagnoses:  Medical clearance for psychiatric admission    ED Discharge Orders    None       Emi HolesLaw, Lauralee Waters M, PA-C 08/07/18 0719    Molpus, Jonny RuizJohn, MD 08/07/18 [phone]

## 2018-08-07 NOTE — Progress Notes (Signed)
08/07/18  1657  Called [facility] back to reschedule time until 1930. [facility].

## 2018-08-07 NOTE — Progress Notes (Signed)
08/07/18 @ 1856 Nolen Mualled [facility] [phone] to [facility]. Driver states he will be here to [id number].

## 2018-08-07 NOTE — Progress Notes (Signed)
Psychoeducational Group Note  Date:  08/07/2018 Time:  2102  Group Topic/Focus:  Wrap-Up Group:   The focus of this group is to help patients review their daily goal of treatment and discuss progress on daily workbooks.  Participation Level: Did Not Attend  Participation Quality:  Not Applicable  Affect:  Not Applicable  Cognitive:  Not Applicable  Insight:  Not Applicable  Engagement in Group: Not Applicable  Additional Comments:  The patient did not attend group since he had yet to be admitted.   Hazle CocaGOODMAN, Shadeed Colberg S 08/07/2018, 9:02 PM

## 2018-08-07 NOTE — ED Notes (Signed)
Pt stated "I didn't go to school today.  I was getting ready for the funeral.  My brother's funeral.  I smoked weed today.  That's all I do.  My mother called the cops on me.

## 2018-08-07 NOTE — Progress Notes (Signed)
TTS consulted with Gabriel ConnJason Berry, NP who recommends continued   observation for safety and stabilization. Pt to be reassessed in the   AM by psych. EDP Law, Gabriel BogaAlexandra M, PA-C and pt's nurse Gabriel LoseElaine, RN have been advised of   the disposition.   Gabriel Nelson, MSW, LCSW Therapeutic Triage Specialist  [phone]

## 2018-08-07 NOTE — ED Notes (Signed)
Pt transported to [facility] by [facility] transportation service for continuation of specialized care. Belongings given to driver after patient signed for them. Pt left in no acute distress. 

## 2018-08-07 NOTE — ED Notes (Signed)
TTS assessment in progress. 
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
## 2018-08-08 DIAGNOSIS — F4325 Adjustment disorder with mixed disturbance of emotions and conduct: Principal | ICD-10-CM

## 2018-08-08 DIAGNOSIS — Z634 Disappearance and death of family member: Secondary | ICD-10-CM

## 2018-08-08 DIAGNOSIS — F1220 Cannabis dependence, uncomplicated: Secondary | ICD-10-CM

## 2018-08-08 DIAGNOSIS — Z590 Homelessness: Secondary | ICD-10-CM

## 2018-08-08 DIAGNOSIS — F419 Anxiety disorder, unspecified: Secondary | ICD-10-CM

## 2018-08-08 MED ORDER — ARIPIPRAZOLE 10 MG PO TABS
10.0000 mg | ORAL_TABLET | Freq: Every day | ORAL | Status: DC
  Administered 2018-08-08 – 2018-08-11 (×4): 10 mg via ORAL
  Filled 2018-08-08 (×6): qty 1

## 2018-08-08 NOTE — Tx Team (Signed)
Interdisciplinary Treatment and Diagnostic Plan Update  08/08/2018 Time of Session: 10:29 AM  Yuvraj Pfeifer MRN: [id number]  Principal Diagnosis: <principal problem not specified>  Secondary Diagnoses: Active Problems:   Adjustment disorder with mixed disturbance of emotions and conduct   Current Medications:  Current Facility-Administered Medications  Medication Dose Route Frequency Provider Last Rate Last Dose  . acetaminophen (TYLENOL) tablet 650 mg  650 mg Oral Q6H PRN Ethelene Hal, NP      . alum & mag hydroxide-simeth (MAALOX/MYLANTA) 200-200-20 MG/5ML suspension 30 mL  30 mL Oral Q4H PRN Ethelene Hal, NP      . hydrOXYzine (ATARAX/VISTARIL) tablet 25 mg  25 mg Oral TID PRN Ethelene Hal, NP      . Influenza vac split quadrivalent PF (FLUARIX) injection 0.5 mL  0.5 mL Intramuscular Tomorrow-1000 Lindon Romp A, NP      . magnesium hydroxide (MILK OF MAGNESIA) suspension 30 mL  30 mL Oral Daily PRN Ethelene Hal, NP      . OLANZapine zydis (ZYPREXA) disintegrating tablet 2.5 mg  2.5 mg Oral BID Ethelene Hal, NP   2.5 mg at 08/08/18 0829  . pneumococcal 23 valent vaccine (PNU-IMMUNE) injection 0.5 mL  0.5 mL Intramuscular Tomorrow-1000 Lindon Romp A, NP      . traZODone (DESYREL) tablet 50 mg  50 mg Oral QHS PRN Ethelene Hal, NP        PTA Medications: No medications prior to admission.    Patient Stressors: Loss of brother in traumatic death Marital or family conflict Substance abuse  Patient Strengths: Ability for insight Active sense of humor Average or above average intelligence  Treatment Modalities: Medication Management, Group therapy, Case management,  1 to 1 session with clinician, Psychoeducation, Recreational therapy.   Physician Treatment Plan for Primary Diagnosis: <principal problem not specified> Long Term Goal(s): Improvement in symptoms so as ready for discharge  Short Term Goals:    Medication  Management: Evaluate patient's response, side effects, and tolerance of medication regimen.  Therapeutic Interventions: 1 to 1 sessions, Unit Group sessions and Medication administration.  Evaluation of Outcomes: Progressing  Physician Treatment Plan for Secondary Diagnosis: Active Problems:   Adjustment disorder with mixed disturbance of emotions and conduct   Long Term Goal(s): Improvement in symptoms so as ready for discharge  Short Term Goals:    Medication Management: Evaluate patient's response, side effects, and tolerance of medication regimen.  Therapeutic Interventions: 1 to 1 sessions, Unit Group sessions and Medication administration.  Evaluation of Outcomes: Progressing   RN Treatment Plan for Primary Diagnosis: <principal problem not specified> Long Term Goal(s): Knowledge of disease and therapeutic regimen to maintain health will improve  Short Term Goals: Ability to identify and develop effective coping behaviors will improve and Compliance with prescribed medications will improve  Medication Management: RN will administer medications as ordered by provider, will assess and evaluate patient's response and provide education to patient for prescribed medication. RN will report any adverse and/or side effects to prescribing provider.  Therapeutic Interventions: 1 on 1 counseling sessions, Psychoeducation, Medication administration, Evaluate responses to treatment, Monitor vital signs and CBGs as ordered, Perform/monitor CIWA, COWS, AIMS and Fall Risk screenings as ordered, Perform wound care treatments as ordered.  Evaluation of Outcomes: Progressing   LCSW Treatment Plan for Primary Diagnosis: <principal problem not specified> Long Term Goal(s): Safe transition to appropriate next level of care at discharge, Engage patient in therapeutic group addressing interpersonal concerns.  Short Term Goals: Engage  patient in aftercare planning with referrals and  resources  Therapeutic Interventions: Assess for all discharge needs, 1 to 1 time with Social worker, Explore available resources and support systems, Assess for adequacy in community support network, Educate family and significant other(s) on suicide prevention, Complete Psychosocial Assessment, Interpersonal group therapy.  Evaluation of Outcomes: Met Return home, follow up outpt   Progress in Treatment: Attending groups: Yes Participating in groups: Yes Taking medication as prescribed: Yes Toleration medication: Yes, no side effects reported at this time Family/Significant other contact made: No Patient understands diagnosis: Yes AEB asking for help with mood instability Discussing patient identified problems/goals with staff: Yes Medical problems stabilized or resolved: Yes Denies suicidal/homicidal ideation: Yes Issues/concerns per patient self-inventory: None Other: N/A  New problem(s) identified: None identified at this time.   New Short Term/Long Term Goal(s): "I'm working on controlling my temper and my anger."   Discharge Plan or Barriers:   Reason for Continuation of Hospitalization: Anger issues Homicidal ideation  Medication stabilization   Estimated Length of Stay: 9/18  Attendees: Patient: Gabriel Nelson 08/08/2018  10:29 AM  Physician: Maris Berger, MD 08/08/2018  10:29 AM  Nursing: Sena Hitch, RN 08/08/2018  10:29 AM  RN Care Manager: Lars Pinks, RN 08/08/2018  10:29 AM  Social Worker: Ripley Fraise 08/08/2018  10:29 AM  Recreational Therapist: Winfield Cunas 08/08/2018  10:29 AM  Other: Norberto Sorenson 08/08/2018  10:29 AM  Other:  08/08/2018  10:29 AM    Scribe for Treatment Team:  Roque Lias LCSW 08/08/2018 10:29 AM

## 2018-08-08 NOTE — H&P (Signed)
Psychiatric Admission Assessment Adult  Patient Identification: Gabriel Nelson MRN:  [id number] Date of Evaluation:  08/08/2018 Chief Complaint:  MDD Principal Diagnosis: Adjustment disorder with mixed disturbance of emotions and conduct Diagnosis:   Patient Active Problem List   Diagnosis Date Noted  . Moderate cannabis use disorder ([place]) [F12.20] 08/08/2018  . Adjustment disorder with mixed disturbance of emotions and conduct [F43.25] 08/02/2018  . Rhabdomyolysis [M62.82] 10/27/2016  . Marijuana intoxication ([place]) [F12.929] 10/27/2016  . Osteomyelitis [facility]) [M86.9] 10/23/2015   History of Present Illness:   Gabriel Nelson is a 17 y/o M with previous psychiatric history of MDD and cannabis use disorder who was admitted from [place] with worsening mood instability, agitation, disorganized behaviors, and impulsive/disruptive behaviors such as yelling and breaking objects. Pt has been having multiple presentations to the ED (3x in last 1 month). His symptoms are in the context of recent traumatic shooting death of his brother and then being kicked out of home by his mother. Pt was medically cleared and then transferred to [facility] for additional treatment and stabilization.  Upon initial interview, pt shares, "My brother's passing away - that's what brought me in. I was making people nervous." Pt alludes he has been struggling with his grief and he has been trying to work through his emotions in different ways such as breaking glass, working on his music, and isolating in his mother's shed in the back yard. Pt acknowledges he has been having some escalating conflicts which have resulted in his hospitalization. He endorses sadness and grief, but he reports no depression prior to these events. He is sleeping adequately. He denies SI/HI/AH/VH. He has no thoughts of harming the person that hurt his brother. He denies physical complaints. He denies symptoms of detachment, derealization. He emphasizes he is future  oriented about moving on. He denies hypervigilance, hyperarousal, nightmares, flashbacks. He denies symptoms of OCD. He reports smoking cannabis multiple times every day, and he also smokes 0.25 ppd of cigarettes plus 3-4 small cigars. He denies other illicit substance use.  Discussed with patient about treatment options. He is currently taking no medications, and he reports previous trials of zoloft and risperdal which he report made him "feel weird" when he used cannabis in combination with them. Recommended to patient to avoid all illicit substance use in combination with his medications. Pt agrees to trial of abilify for his symptoms. He had no further questions, comments, or concerns.   Associated Signs/Symptoms: Depression Symptoms:  depressed mood, insomnia, difficulty concentrating, anxiety, (Hypo) Manic Symptoms:  NA Anxiety Symptoms:  Excessive Worry, Psychotic Symptoms:  NA PTSD Symptoms: Had a traumatic exposure:  traumatic death of brother recently Had a traumatic exposure in the last month:  see above Re-experiencing:  Intrusive Thoughts Hypervigilance:  No Hyperarousal:  Emotional Numbness/Detachment Irritability/Anger Avoidance:  None Total Time spent with patient: 1 hour  Past Psychiatric History:   -previous inpt tx in [place] in 2015, [place] in 10/2016 and 11/2016 -No current outpt provider - no current outpt medications - no hx of suicide attempts   Is the patient at risk to self? Yes.    Has the patient been a risk to self in the past 6 months? Yes.    Has the patient been a risk to self within the distant past? Yes.    Is the patient a risk to others? Yes.    Has the patient been a risk to others in the past 6 months? Yes.    Has the patient been a  risk to others within the distant past? Yes.     Prior Inpatient Therapy:   Prior Outpatient Therapy:    Alcohol Screening: 1. How often do you have a drink containing alcohol?: Monthly or less 2. How many drinks  containing alcohol do you have on a typical day when you are drinking?: 1 or 2 3. How often do you have six or more drinks on one occasion?: Never AUDIT-C Score: 1 9. Have you or someone else been injured as a result of your drinking?: No 10. Has a relative or friend or a doctor or another health worker been concerned about your drinking or suggested you cut down?: No Alcohol Use Disorder Identification Test Final Score (AUDIT): 1 Substance Abuse History in the last 12 months:  Yes.   Consequences of Substance Abuse: Medical Consequences:  worsened mood symptoms Previous Psychotropic Medications: Yes  Psychological Evaluations: Yes  Past Medical History:  Past Medical History:  Diagnosis Date  . ADHD   . Medical history non-contributory    History reviewed. No pertinent surgical history. Family History: History reviewed. No pertinent family history. Family Psychiatric  History: brother hx of bipolar Tobacco Screening:   Social History: Born in [place], but mostly grew up in [place]. He lives in [place], and his mother recently kicked him out so he has been staying with friends. He is not working. 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 He has no children. He denies other previous trauma history. Social History   Substance and Sexual Activity  Alcohol Use Yes   Comment: He states occassionally, reports last beer was when his brother died.      Social History   Substance and Sexual Activity  Drug Use Yes  . Types: Marijuana, Barbituates   Comment: Daily. Last used: yesterday     Additional Social History: Are you sexually active?: Yes What is your sexual orientation?: heterosexaul Does patient have children?: No                         Allergies:  No Known Allergies Lab Results:  Results for orders placed or performed during the hospital encounter of 08/06/18 (from the past 48 hour(s))  Comprehensive metabolic panel      Status: Abnormal   Collection Time: 08/07/18 12:37 AM  Result Value Ref Range   Sodium 144 135 - 145 mmol/L   Potassium 3.3 (L) 3.5 - 5.1 mmol/L   Chloride 105 98 - 111 mmol/L   CO2 26 22 - 32 mmol/L   Glucose, Bld 85 70 - 99 mg/dL   BUN 14 6 - 20 mg/dL   Creatinine, Ser 1.25 (H) 0.61 - 1.24 mg/dL   Calcium 10.0 8.9 - 10.3 mg/dL   Total Protein 8.4 (H) 6.5 - 8.1 g/dL   Albumin 5.1 (H) 3.5 - 5.0 g/dL   AST 60 (H) 15 - 41 U/L   ALT 38 0 - 44 U/L   Alkaline Phosphatase 50 38 - 126 U/L   Total Bilirubin 1.4 (H) 0.3 - 1.2 mg/dL   GFR calc non Af Amer >60 >60 mL/min   GFR calc Af Amer >60 >60 mL/min    Comment: (NOTE) The eGFR has been calculated using the CKD EPI equation. This calculation has not been validated in all clinical situations. eGFR's persistently <60 mL/min signify possible Chronic Kidney Disease.    Anion gap 13 5 - 15  Comment: Performed at [facility], [place] [address] [place], [place] [address]  Ethanol     Status: None   Collection Time: 08/07/18 12:37 AM  Result Value Ref Range   Alcohol, Ethyl (B) <10 <10 mg/dL    Comment: (NOTE) Lowest detectable limit for serum alcohol is 10 mg/dL. For medical purposes only. Performed at [facility], [place] [address] [place], [place] [address] CORRECTED ON 09/12 AT 0156: PREVIOUSLY REPORTED AS <37   Salicylate level     Status: None   Collection Time: 08/07/18 12:37 AM  Result Value Ref Range   Salicylate Lvl <3.4 2.8 - 30.0 mg/dL    Comment: Performed at [facility], [place] [address], [place], [place] [address]  Acetaminophen level     Status: Abnormal   Collection Time: 08/07/18 12:37 AM  Result Value Ref Range   Acetaminophen (Tylenol), Serum <10 (L) 10 - 30 ug/mL    Comment: (NOTE) Therapeutic concentrations vary significantly. A range of 10-30 ug/mL  may be an effective concentration for many patients. However, some  are best treated at concentrations  outside of this range. Acetaminophen concentrations >150 ug/mL at 4 hours after ingestion  and >50 ug/mL at 12 hours after ingestion are often associated with  toxic reactions. Performed at [facility], [place] [address] [place], [place] [address] CORRECTED ON 09/12 AT 0156: PREVIOUSLY REPORTED AS <10   cbc     Status: None   Collection Time: 08/07/18 12:37 AM  Result Value Ref Range   WBC 8.9 4.0 - 10.5 K/uL   RBC 5.24 4.22 - 5.81 MIL/uL   Hemoglobin 15.7 13.0 - 17.0 g/dL   HCT 44.9 39.0 - 52.0 %   MCV 85.7 78.0 - 100.0 fL   MCH 30.0 26.0 - 34.0 pg   MCHC 35.0 30.0 - 36.0 g/dL   RDW 12.5 11.5 - 15.5 %   Platelets 208 150 - 400 K/uL    Comment: Performed at [facility], [place] [address], [place], [place] [address]  Rapid urine drug screen (hospital performed)     Status: Abnormal   Collection Time: 08/07/18 12:37 AM  Result Value Ref Range   Opiates NONE DETECTED NONE DETECTED   Cocaine NONE DETECTED NONE DETECTED   Benzodiazepines NONE DETECTED NONE DETECTED   Amphetamines NONE DETECTED NONE DETECTED   Tetrahydrocannabinol POSITIVE (A) NONE DETECTED   Barbiturates POSITIVE (A) NONE DETECTED    Comment: (NOTE) DRUG SCREEN FOR MEDICAL PURPOSES ONLY.  IF CONFIRMATION IS NEEDED FOR ANY PURPOSE, NOTIFY LAB WITHIN 5 DAYS. LOWEST DETECTABLE LIMITS FOR URINE DRUG SCREEN Drug Class                     Cutoff (ng/mL) Amphetamine and metabolites    1000 Barbiturate and metabolites    200 Benzodiazepine                 597 Tricyclics and metabolites     300 Opiates and metabolites        300 Cocaine and metabolites        300 THC                            50 Performed at [facility], [place] [address] [place], [place] [address]     Blood Alcohol level:  Lab Results  Component Value Date   ETH <10 08/07/2018  ETH <10 86/57/8469    Metabolic Disorder Labs:  No results found for: HGBA1C, MPG No results found for:  PROLACTIN No results found for: CHOL, TRIG, HDL, CHOLHDL, VLDL, LDLCALC  Current Medications: Current Facility-Administered Medications  Medication Dose Route Frequency Provider Last Rate Last Dose  . acetaminophen (TYLENOL) tablet 650 mg  650 mg Oral Q6H PRN Ethelene Hal, NP      . alum & mag hydroxide-simeth (MAALOX/MYLANTA) 200-200-20 MG/5ML suspension 30 mL  30 mL Oral Q4H PRN Ethelene Hal, NP      . ARIPiprazole (ABILIFY) tablet 10 mg  10 mg Oral Daily Pennelope Bracken, MD      . hydrOXYzine (ATARAX/VISTARIL) tablet 25 mg  25 mg Oral TID PRN Ethelene Hal, NP      . magnesium hydroxide (MILK OF MAGNESIA) suspension 30 mL  30 mL Oral Daily PRN Ethelene Hal, NP      . traZODone (DESYREL) tablet 50 mg  50 mg Oral QHS PRN Ethelene Hal, NP       PTA Medications: No medications prior to admission.    Musculoskeletal: Strength & Muscle Tone: within normal limits Gait & Station: normal Patient leans: N/A  Psychiatric Specialty Exam: Physical Exam  Nursing note and vitals reviewed.   Review of Systems  Constitutional: Negative for chills and fever.  Respiratory: Negative for cough and shortness of breath.   Cardiovascular: Negative for chest pain.  Gastrointestinal: Negative for abdominal pain, heartburn, nausea and vomiting.  Psychiatric/Behavioral: Positive for depression. Negative for hallucinations and suicidal ideas. The patient is nervous/anxious. The patient does not have insomnia.     Blood pressure 126/83, pulse 65, temperature 98.8 F (37.1 C), temperature source Oral, resp. rate 18, height 5' 10"  (1.778 m), weight 85.3 kg, SpO2 100 %.Body mass index is 26.98 kg/m.  General Appearance: Casual and Fairly Groomed  Eye Contact:  Good  Speech:  Clear and Coherent and Normal Rate  Volume:  Normal  Mood:  Anxious and Depressed  Affect:  Appropriate, Congruent and Constricted  Thought Process:  Coherent and Goal Directed   Orientation:  Full (Time, Place, and Person)  Thought Content:  Logical  Suicidal Thoughts:  No  Homicidal Thoughts:  No  Memory:  Immediate;   Fair Recent;   Fair Remote;   Fair  Judgement:  Poor  Insight:  Lacking  Psychomotor Activity:  Normal  Concentration:  Concentration: Fair  Recall:  [employer] of Knowledge:  Fair  Language:  Fair  Akathisia:  No  Handed:    AIMS (if indicated):     Assets:  Resilience Social Support  ADL's:  Intact  Cognition:  WNL  Sleep:  Number of Hours: 6.5    Treatment Plan Summary: Daily contact with patient to assess and evaluate symptoms and progress in treatment and Medication management  Observation Level/Precautions:  15 minute checks  Laboratory:  CBC Chemistry Profile HbAIC UDS UA  Psychotherapy:  Encourage participation in groups and therapeutic milieu   Medications:  Start abilify 37m po qDay. Continue other current orders without changes. See MAR for agitation PRN orders.  Consultations:    Discharge Concerns:    Estimated LOS: 5-7 days  Other:     Physician Treatment Plan for Primary Diagnosis: Adjustment disorder with mixed disturbance of emotions and conduct Long Term Goal(s): Improvement in symptoms so as ready for discharge  Short Term Goals: Ability to identify and develop effective coping behaviors will improve  Physician Treatment  Plan for Secondary Diagnosis: Principal Problem:   Adjustment disorder with mixed disturbance of emotions and conduct Active Problems:   Moderate cannabis use disorder (HCC)  Long Term Goal(s): Improvement in symptoms so as ready for discharge  Short Term Goals: Ability to demonstrate self-control will improve  I certify that inpatient services furnished can reasonably be expected to improve the patient's condition.    Pennelope Bracken, MD 9/13/20193:42 PM

## 2018-08-08 NOTE — [facility] Suicide Risk Assessment (Signed)
[facility] INPATIENT:  Family/Significant Other Suicide Prevention Education  Suicide Prevention Education:  Education Completed; No one has been identified by the patient as the family member/significant other with whom the patient will be residing, and identified as the person(s) who will aid the patient in the event of a mental health crisis (suicidal ideations/suicide attempt).  With written consent from the patient, the family member/significant other has been provided the following suicide prevention education, prior to the and/or following the discharge of the patient.  The suicide prevention education provided includes the following:  Suicide risk factors  Suicide prevention and interventions  National Suicide Hotline telephone number  [facility] assessment telephone number  [facility] Emergency Assistance 911  [facility] and/or Residential Mobile Crisis Unit telephone number  Request made of family/significant other to:  Remove weapons (e.g., guns, rifles, knives), all items previously/currently identified as safety concern.    Remove drugs/medications (over-the-counter, prescriptions, illicit drugs), all items previously/currently identified as a safety concern.  The family member/significant other verbalizes understanding of the suicide prevention education information provided.  The family member/significant other agrees to remove the items of safety concern listed above. The patient did not endorse SI at the time of admission, nor did the patient c/o SI during the stay here.  SPE not required.  Gabriel Nelson 08/08/2018, 7:56 AM

## 2018-08-08 NOTE — Progress Notes (Signed)
Adult Psychoeducational Group Note  Date:  08/08/2018 Time:  9:12 AM  Group Topic/Focus:  Goals Group:   The focus of this group is to help patients establish daily goals to achieve during treatment and discuss how the patient can incorporate goal setting into their daily lives to aide in recovery.  Participation Level:  Active  Participation Quality:  Appropriate  Affect:  Appropriate  Cognitive:  Appropriate  Insight: Appropriate  Engagement in Group:  Engaged  Modes of Intervention:  Discussion  Additional Comments:  Pt attended group with goals of surrounding himself with people that he can trust. Also educating the people around him.   Deforest Hoyleslexandre J Nixon Kolton 08/08/2018, 9:12 AM

## 2018-08-08 NOTE — [facility] Suicide Risk Assessment (Signed)
[facility]   Nursing information obtained from:  Patient Demographic factors:  Male, Adolescent or young adult, Low socioeconomic status Current Mental Status:  Intention to act on plan to harm others(his brother's murderer) Loss Factors:  Loss of significant relationship Historical Factors:  NA Risk Reduction Factors:  Sense of responsibility to family  Total Time spent with patient: 1 hour Principal Problem: Adjustment disorder with mixed disturbance of emotions and conduct Diagnosis:   Patient Active Problem List   Diagnosis Date Noted  . 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 Adjustment disorder with mixed disturbance of emotions and conduct [F43.25] 08/02/2018  . Rhabdomyolysis [M62.82] 10/27/2016  . Marijuana intoxication (HCC) [F12.929] 10/27/2016  . Osteomyelitis (HCC) [M86.9] 10/23/2015   Subjective Data: see H&P  Continued Clinical Symptoms:  Alcohol Use Disorder Identification Test Final Score (AUDIT): 1 The "Alcohol Use Disorders Identification Test", Guidelines for Use in Primary Care, Second Edition.  World Science writerHealth Organization [facility](WHO). Score between 0-7:  no or low risk or alcohol related problems. Score between 8-15:  moderate risk of alcohol related problems. Score between 16-19:  high risk of alcohol related problems. Score 20 or above:  warrants further diagnostic evaluation for alcohol dependence and treatment.   Psychiatric Specialty Exam: Physical Exam  Nursing note and vitals reviewed.     Blood pressure 126/83, pulse 65, temperature 98.8 F (37.1 C), temperature source Oral, resp. rate 18, height 5\' 10"  (1.778 m), weight 85.3 kg, SpO2 100 %.Body mass index is 26.98 kg/m.   COGNITIVE FEATURES THAT CONTRIBUTE TO RISK:  None    SUICIDE RISK:   Minimal: No identifiable suicidal ideation.  Patients presenting with no risk factors but with morbid ruminations; may be classified as minimal risk based on the  severity of the depressive symptoms  PLAN OF CARE: see H&P  I certify that inpatient services furnished can reasonably be expected to improve the patient's condition.   Gabriel Likenshristopher T Nobie Alleyne, MD 08/08/2018, 3:42 PM

## 2018-08-08 NOTE — Progress Notes (Signed)
Patient did not attend wrap up group. 

## 2018-08-08 NOTE — Plan of Care (Signed)
  Problem: Education: Goal: Emotional status will improve Outcome: Progressing   Problem: Activity: Goal: Interest or engagement in activities will improve Outcome: Progressing   Problem: Safety: Goal: Periods of time without injury will increase Outcome: Progressing  DAR NOTE: Patient presents with flat affect and pleasant mood.  Denies suicidal thoughts, pain, auditory and visual hallucinations.  Rates depression at 0, hopelessness at 0, and anxiety at 0.  Maintained on routine safety checks.  Medications given as prescribed.  Support and encouragement offered as needed.  Attended group and participated.  States goal for today is "work on my anger".  Patient observed socializing with peers in the dayroom.  Patient is safe on and off the unit.  Offered no complaint.

## 2018-08-08 NOTE — [facility] Group Notes (Signed)
[facility] LCSW Group Therapy  08/08/2018  1:05 PM  Type of Therapy:  Group therapy  Participation Level:  Did not attend  Participation Quality:  Attentive  Affect:  Flat  Cognitive:  Oriented  Insight:  Limited  Engagement in Therapy:  Limited  Modes of Intervention:  Discussion, Socialization  Summary of Progress/Problems:  Chaplain was here to lead a group on themes of hope and courage.  Ida Rogueorth, Ileanna Gemmill B 08/08/2018 2:31 PM

## 2018-08-09 DIAGNOSIS — G4700 Insomnia, unspecified: Secondary | ICD-10-CM

## 2018-08-09 DIAGNOSIS — F17210 Nicotine dependence, cigarettes, uncomplicated: Secondary | ICD-10-CM

## 2018-08-09 NOTE — [facility] Group Notes (Signed)
LCSW Group Therapy Note  08/09/2018    11:00am - 12:00pm  Type of Therapy and Topic:  Group Therapy: Anger and Coping Skills  Participation Level:  Active   Description of Group:   In this group, patients learned how to recognize the physical, cognitive, emotional, and behavioral responses they have to anger-provoking situations.  They identified how they usually or often react when angered, and learned how healthy and unhealthy coping skills work initially, but the unhealthy ones stop working.   They analyzed how their frequently-chosen coping skill is possibly beneficial and how it is possibly unhelpful.  The group discussed a variety of healthier coping skills that could help in resolving the actual issues, as well as how to go about planning for the the possibility of future similar situations.  Therapeutic Goals: 1. Patients will identify one thing that makes them angry and how they feel emotionally and physically, what their thoughts are or tend to be in those situations, and what healthy or unhealthy coping mechanism they typically use 2. Patients will identify how their coping technique works for them, as well as how it works against them. 3. Patients will explore possible new behaviors to use in future anger situations. 4. Patients will learn that anger itself is normal and cannot be eliminated, and that healthier coping skills can assist with resolving conflict rather than worsening situations.  Summary of Patient Progress:  The patient shared that he was "angry off the scale" 1 week ago.  He also said he did not want to discuss the circumstances, but stated it was hatred that fueled his anger, as well as confusion.  Therapeutic Modalities:   Cognitive Behavioral Therapy Motivation Interviewing  Lynnell ChadMareida J Grossman-Orr

## 2018-08-09 NOTE — [facility] Group Notes (Signed)
Adult Psychoeducational Group Note  Date:  08/09/2018 Time:  10:32 AM  Group Topic/Focus:  Building Self Esteem:   The Focus of this group is helping patients become aware of the effects of self-esteem on their lives, the things they and others do that enhance or undermine their self-esteem, seeing the relationship between their level of self-esteem and the choices they make and learning ways to enhance self-esteem.  Participation Level:  Active  Participation Quality:  Appropriate  Affect:  Appropriate  Cognitive:  Appropriate  Insight: Appropriate and Good  Engagement in Group:  Engaged  Modes of Intervention:  Discussion and Socialization  Additional Comments:    Chauncey FischerRobinson, Nasser Ku Long 08/09/2018, 10:32 AM

## 2018-08-09 NOTE — Progress Notes (Signed)
Nursing Progress Note: 7p-7a D: Pt currently presents with a pleasant affect and behavior. Pt states "I am ready to go back home and get back to work." Interacting appropriately with the milieu. Pt reports good sleep during the previous night with current medication regimen. Pt did attend wrap-up group.  A: Pt provided with medications per providers orders. Pt's labs and vitals were monitored throughout the night. Pt supported emotionally and encouraged to express concerns and questions. Pt educated on medications.  R: Pt's safety ensured with 15 minute and environmental checks. Pt currently denies SI, HI, and AVH. Pt verbally contracts to seek staff if SI,HI, or AVH occurs and to consult with staff before acting on any harmful thoughts. Will continue to monitor.
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
## 2018-08-09 NOTE — Progress Notes (Signed)
Adult Psychoeducational Group Note  Date:  08/09/2018 Time:  9:03 PM  Group Topic/Focus:  Wrap-Up Group:   The focus of this group is to help patients review their daily goal of treatment and discuss progress on daily workbooks.  Participation Level:  Active  Participation Quality:  Appropriate  Affect:  Appropriate  Cognitive:  Appropriate  Insight: Appropriate  Engagement in Group:  Engaged  Modes of Intervention:  Discussion  Additional Comments:  The patient also said that he rates today a 10.The patient also said that he attended groups.  Octavio Mannshigpen, Leshea Jaggers Lee 08/09/2018, 9:03 PM

## 2018-08-09 NOTE — [facility] Group Notes (Signed)
[facility] Group Notes:  (Nursing/MHT/Case Management/Adjunct)  Date:  08/09/2018  Time:  3:43 PM  Type of Therapy:  Psychoeducational Skills  Participation Level:  Active  Participation Quality:  Appropriate  Affect:  Appropriate  Cognitive:  Appropriate  Insight:  Appropriate  Engagement in Group:  Engaged  Modes of Intervention:  Problem-solving  Summary of Progress/Problems:  Topic was on anger management.  Bethann PunchesJane O Eudell Julian 08/09/2018, 3:43 PM
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
## 2018-08-09 NOTE — Progress Notes (Signed)
DAR NOTE: Patient presents with bright affect and calm mood. Reports good sleep, fair appetite, normal energy, and good concentration. Denies pain, auditory and visual hallucinations.  Rates depression at 0, hopelessness at 0, and anxiety at 0.  Maintained on routine safety checks.  Medications given as prescribed.  Support and encouragement offered as needed.  Attended group and participated.  States goal for today is " be out going." Patient observed socializing with peers in the dayroom.  Offered no complaint.

## 2018-08-10 NOTE — Progress Notes (Signed)
[facility] MD Progress Note  08/10/2018 5:27 PM Gabriel Nelson  MRN:  [id number]  Subjective: Tonye Becket reports, "My mood is good. I'm taking the medicines. They are not making me feel weird. I think, I'm good to go home".  Gabriel Nelson is a 17 y/o M with previous psychiatric history of MDD and cannabis use disorder who was admitted from [facility] with worsening mood instability, agitation, disorganized behaviors, and impulsive/disruptive behaviors such as yelling and breaking objects. Pt has been having multiple presentations to the ED (3x in last 1 month). His symptoms are in the context of recent traumatic shooting death of his brother and then being kicked out of home by his mother. Pt was medically cleared and then transferred to [facility] for additional treatment and stabilization. Upon initial interview, pt shares, "My brother's passing away - that's what brought me in. I was making people nervous." Pt alludes he has been struggling with his grief and he has been trying to work through his emotions in different ways such as breaking glass, working on his music, and isolating in his mother's shed in the back yard.   Gabriel Nelson is seen, chart reviewed. The chart finding discussed with the treatment. He presents alert, oriented x 4. He is aware of situation. He is visible on the unit, attending group sessions. He is taking & tolerating his treatment regimen. He denies any adverse effects. He presents with an improved affect today. He is flashing some smiles. Says he is trying to deal with the deaths of his brothers 13 days ago. They were shot during a home invasion. He says he is going to focus on doing right once he gets discharge from here. He says he will remain on the current medications because they do not make him feel weird like the previous ones he took in the past. He believed those medications were Zoloft & Risperdal. 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 He denies any SIHI, AVH, delusional thoughts or paranoia. He does not appear to be responding to any internal stimuli. Says his step-mother is supportive.  Principal Problem: Adjustment disorder with mixed disturbance of emotions and conduct  Diagnosis:   Patient Active Problem List   Diagnosis Date Noted  . 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 Adjustment disorder with mixed disturbance of emotions and conduct [F43.25] 08/02/2018  . Rhabdomyolysis [M62.82] 10/27/2016  . Marijuana intoxication (HCC) [F12.929] 10/27/2016  . Osteomyelitis (HCC) [M86.9] 10/23/2015   Total Time spent with patient: 25 minutes  Past Psychiatric History: See H&P.  Past Medical History:  Past Medical History:  Diagnosis Date  . ADHD   . Medical history non-contributory    History reviewed. No pertinent surgical history.  Family History: History reviewed. No pertinent family history.  Family Psychiatric  History: See H&P.  Social History:  Social History   Substance and Sexual Activity  Alcohol Use Yes   Comment: He states occassionally, reports last beer was when his brother died.      Social History   Substance and Sexual Activity  Drug Use Yes  . Types: Marijuana, Barbituates   Comment: Daily. Last used: yesterday     Social History   Socioeconomic History  . Marital status: Single    Spouse name: Not on file  . Number of children: Not on file  . Years of education: Not on file  .  Highest education level: Not on file  Occupational History  . Not on file  Social Needs  . Financial resource strain: Not on file  . Food insecurity:    Worry: Not on file    Inability: Not on file  . Transportation needs:    Medical: Not on file    Non-medical: Not on file  Tobacco Use  . Smoking status: Current Every Day Smoker    Packs/day: 0.25    Years: 4.00    Pack years: 1.00    Types: Cigarettes, Cigars  . Smokeless tobacco: Never Used   Substance and Sexual Activity  . Alcohol use: Yes    Comment: He states occassionally, reports last beer was when his brother died.   . Drug use: Yes    Types: Marijuana, Barbituates    Comment: Daily. Last used: yesterday   . Sexual activity: Not on file    Comment: uta, patient not fully oriented  Lifestyle  . Physical activity:    Days per week: Not on file    Minutes per session: Not on file  . Stress: Not on file  Relationships  . Social connections:    Talks on phone: Not on file    Gets together: Not on file    Attends religious service: Not on file    Active member of club or organization: Not on file    Attends meetings of clubs or organizations: Not on file    Relationship status: Not on file  Other Topics Concern  . Not on file  Social History Narrative  . Not on file   Additional Social History:   Sleep: Good  Appetite:  Good  Current Medications: Current Facility-Administered Medications  Medication Dose Route Frequency Provider Last Rate Last Dose  . acetaminophen (TYLENOL) tablet 650 mg  650 mg Oral Q6H PRN Laveda AbbeParks, Laurie Britton, NP   650 mg at 08/09/18 1257  . alum & mag hydroxide-simeth (MAALOX/MYLANTA) 200-200-20 MG/5ML suspension 30 mL  30 mL Oral Q4H PRN Laveda AbbeParks, Laurie Britton, NP      . ARIPiprazole (ABILIFY) tablet 10 mg  10 mg Oral Daily Micheal Likensainville, Christopher T, MD   10 mg at 08/10/18 0741  . hydrOXYzine (ATARAX/VISTARIL) tablet 25 mg  25 mg Oral TID PRN Laveda AbbeParks, Laurie Britton, NP      . magnesium hydroxide (MILK OF MAGNESIA) suspension 30 mL  30 mL Oral Daily PRN Laveda AbbeParks, Laurie Britton, NP      . traZODone (DESYREL) tablet 50 mg  50 mg Oral QHS PRN Laveda AbbeParks, Laurie Britton, NP       Lab Results: No results found for this or any previous visit (from the past 48 hour(s)).  Blood Alcohol level:  Lab Results  Component Value Date   ETH <10 08/07/2018   ETH <10 08/04/2018   Metabolic Disorder Labs: No results found for: HGBA1C, MPG No results found  for: PROLACTIN No results found for: CHOL, TRIG, HDL, CHOLHDL, VLDL, LDLCALC  Physical Findings: AIMS:  , ,  ,  ,    CIWA:    COWS:     Musculoskeletal: Strength & Muscle Tone: within normal limits Gait & Station: normal Patient leans: N/A  Psychiatric Specialty Exam: Physical Exam  Nursing note and vitals reviewed.   Review of Systems  Psychiatric/Behavioral: Positive for depression ("I'm sad") and substance abuse (UDS positive for Barbiturates & THC). Negative for hallucinations, memory loss and suicidal ideas. The patient is not nervous/anxious and does not have insomnia.  Blood pressure (!) 125/100, pulse 81, temperature 98.4 F (36.9 C), resp. rate 16, height 5\' 10"  (1.778 m), weight 85.3 kg, SpO2 100 %.Body mass index is 26.98 kg/m.  General Appearance: Casual and Fairly Groomed  Eye Contact:  Good  Speech:  Clear and Coherent and Normal Rate  Volume:  Normal  Mood:  Anxious and Depressed  Affect:  Appropriate, Congruent and Constricted  Thought Process:  Coherent and Goal Directed  Orientation:  Full (Time, Place, and Person)  Thought Content:  Logical  Suicidal Thoughts:  No  Homicidal Thoughts:  No  Memory:  Immediate;   Fair Recent;   Fair Remote;   Fair  Judgement:  Poor  Insight:  Lacking  Psychomotor Activity:  Normal  Concentration:  Concentration: Fair  Recall:  [employer] of Knowledge:  Fair  Language:  Fair  Akathisia:  No  Handed:    AIMS (if indicated):     Assets:  Resilience Social Support  ADL's:  Intact  Cognition:  WNL  Sleep:  Number of Hours: 6.5     Treatment Plan Summary: Daily contact with patient to assess and evaluate symptoms and progress in treatment.  - Continue inpatient hospitalization.  - Will continue today 08/10/2018 plan as below except where it is noted.  Mood control.      - Continue Abilify 10 mg po daily.  Anxiety.      - Continue Vistaril 25 mg po tid prn.  Insomnia.      - Continue Trazodone 50 mg po  prn.  Patient to continue to participate in the group sessions.  Discharge disposition plan ongoing.  Armandina Stammer, NP, PMHNP, FNP-BC. 08/10/2018, 5:27 PMPatient ID: Gabriel Nelson, male   DOB: 06-20-2001, 17 y.o.   MRN: [id number]

## 2018-08-10 NOTE — [facility] Group Notes (Signed)
[facility] Group Notes:  (Nursing/MHT/Case Management/Adjunct)  Date:  08/10/2018  Time:  11:26 AM  Type of Therapy:  Psychoeducational Skills  Participation Level:  Active  Participation Quality:  Appropriate  Affect:  Appropriate  Cognitive:  Appropriate  Insight:  Appropriate  Engagement in Group:  Engaged  Modes of Intervention:  Problem-solving  Summary of Progress/Problems: Pt attended Psychoeducational group with topic being healthy support systems.   Dinnis Rog Shanta 08/10/2018, 11:26 AM 

## 2018-08-10 NOTE — [facility] Group Notes (Signed)
[facility] Group Notes:  (Nursing/MHT/Case Management/Adjunct)  Date:  08/10/2018  Time:  9:21 AM  Type of Therapy:  Goals/Orientation Group.  Participation Level:  Active  Participation Quality:  Appropriate  Affect:  Appropriate  Cognitive:  Appropriate  Insight:  Appropriate  Engagement in Group:  Engaged  Modes of Intervention:  Discussion  Summary of Progress/Problems: Pt attended goals/orientation group, pt was receptive.  Jacquelyne BalintForrest, Kemari Mares Shanta 08/10/2018, 9:21 AM

## 2018-08-10 NOTE — [facility] Group Notes (Signed)
[facility] Therapy Note  Date/Time:  08/10/2018  11:00AM-12:00PM  Type of Therapy and Topic:  Group Therapy:  Music and Mood  Participation Level:  Active   Description of Group: In this process group, members listened to a variety of genres of music and identified that different types of music evoke different responses.  Patients were encouraged to identify music that was soothing for them and music that was energizing for them.  Patients discussed how this knowledge can help with wellness and recovery in various ways including managing depression and anxiety as well as encouraging healthy sleep habits.    Therapeutic Goals: 1. Patients will explore the impact of different varieties of music on mood 2. Patients will verbalize the thoughts they have when listening to different types of music 3. Patients will identify music that is soothing to them as well as music that is energizing to them 4. Patients will discuss how to use this knowledge to assist in maintaining wellness and recovery 5. Patients will explore the use of music as a coping skill  Summary of Patient Progress:  At the beginning of group, patient expressed that he felt good and at the end of group said he felt much better. He participated a lot, danced, and laughed a great deal.  He had loud conversations throughout group that were distracting and had to be asked to be quiet a couple of times.  Therapeutic Modalities: Solution Focused Brief Therapy Activity   Gabriel MantleMareida Grossman-Orr, LCSW

## 2018-08-10 NOTE — Progress Notes (Signed)
Adult Psychoeducational Group Note  Date:  08/10/2018 Time:  9:13 PM  Group Topic/Focus:  Wrap-Up Group:   The focus of this group is to help patients review their daily goal of treatment and discuss progress on daily workbooks.  Participation Level:  Active  Participation Quality:  Appropriate  Affect:  Appropriate  Cognitive:  Appropriate  Insight: Appropriate  Engagement in Group:  Engaged  Modes of Intervention:  Discussion  Additional Comments: The patient expressed that he rates today a 8.The patient also said that she attended group.  Octavio Mannshigpen, Markisha Meding Lee 08/10/2018, 9:13 PM

## 2018-08-10 NOTE — Progress Notes (Signed)
DAR NOTE: Patient presents with calm affect and bright  mood.  Pt has been in the day room most of the shift interacting with peers, no problems reported or observed. Denies pain, auditory and visual hallucinations.  Rates depression at 0, hopelessness at 0, and anxiety at 0.  Maintained on routine safety checks.  Medications given as prescribed.  Support and encouragement offered as needed.  Attended group and participated.  States goal for today is "learn more coping skills."  Patient observed socializing with peers in the dayroom.  Offered no complaint.

## 2018-08-11 MED ORDER — HYDROXYZINE HCL 25 MG PO TABS: 25.0000 mg | ORAL_TABLET | Freq: Three times a day (TID) | ORAL | 0 refills | Status: DC | PRN

## 2018-08-11 MED ORDER — ARIPIPRAZOLE 10 MG PO TABS: 10.0000 mg | ORAL_TABLET | Freq: Every day | ORAL | 0 refills | Status: DC

## 2018-08-11 MED ORDER — TRAZODONE HCL 50 MG PO TABS: 50.0000 mg | ORAL_TABLET | Freq: Every evening | ORAL | 0 refills | Status: DC | PRN

## 2018-08-11 NOTE — Discharge Summary (Addendum)
Physician Discharge Summary Note  Patient:  Gabriel Nelson is an 17 y.o., male MRN:  [id number] DOB:  [birth year] Patient phone:  [phone] (home)  Patient address:   [address] [place] [address],  Total Time spent with patient: 20 minutes  Date of Admission:  08/07/2018 Date of Discharge: 08/11/2018  Reason for Admission: Per assessment note: Gabriel Nelson is a 17 y/o M with previous psychiatric history of MDD and cannabis use disorder who was admitted from [facility] with worsening mood instability, agitation, disorganized behaviors, and impulsive/disruptive behaviors such as yelling and breaking objects. Pt has been having multiple presentations to the ED (3x in last 1 month). His symptoms are in the context of recent traumatic shooting death of his brother and then being kicked out of home by his mother. Pt was medically cleared and then transferred to [facility] for additional treatment and stabilization. Upon initial interview, pt shares, "My brother's passing away - that's what brought me in. I was making people nervous." Pt alludes he has been struggling with his grief and he has been trying to work through his emotions in different ways such as breaking glass, working on his music, and isolating in his mother's shed in the back yard. Gabriel Nelson is seen, chart reviewed. The chart finding discussed with the treatment. He presents alert, oriented x 4. He is aware of situation. He is visible on the unit, attending group sessions. He is taking & tolerating his treatment regimen. He denies any adverse effects. He presents with an improved affect today. He is flashing some smiles. Says he is trying to deal with the deaths of his brothers 13 days ago. They were shot during a home invasion. He says he is going to focus on doing right once he gets discharge from here. He says he will remain on the current medications because they do not make him feel weird like the previous ones he took in the past. He  believed those medications were Zoloft & Risperdal. 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 He denies any SIHI, AVH, delusional thoughts or paranoia. He does not appear to be responding to any internal stimuli. Says his step-mother is supportive.  Principal Problem: Adjustment disorder with mixed disturbance of emotions and conduct Discharge Diagnoses: Patient Active Problem List   Diagnosis Date Noted  . 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 Adjustment disorder with mixed disturbance of emotions and conduct [F43.25] 08/02/2018  . Rhabdomyolysis [M62.82] 10/27/2016  . Marijuana intoxication (HCC) [F12.929] 10/27/2016  . Osteomyelitis [facility]) [M86.9] 10/23/2015    Past Psychiatric History:   Past Medical History:  Past Medical History:  Diagnosis Date  . ADHD   . Medical history non-contributory    History reviewed. No pertinent surgical history. Family History: History reviewed. No pertinent family history. Family Psychiatric  History:  Social History:  Social History   Substance and Sexual Activity  Alcohol Use Yes   Comment: He states occassionally, reports last beer was when his brother died.      Social History   Substance and Sexual Activity  Drug Use Yes  . Types: Marijuana, Barbituates   Comment: Daily. Last used: yesterday     Social History   Socioeconomic History  . Marital status: Single    Spouse name: Not on file  . Number of children: Not on file  . Years of education:  Not on file  . Highest education level: Not on file  Occupational History  . Not on file  Social Needs  . Financial resource strain: Not on file  . Food insecurity:    Worry: Not on file    Inability: Not on file  . Transportation needs:    Medical: Not on file    Non-medical: Not on file  Tobacco Use  . Smoking status: Current Every Day Smoker    Packs/day: 0.25    Years: 4.00     Pack years: 1.00    Types: Cigarettes, Cigars  . Smokeless tobacco: Never Used  Substance and Sexual Activity  . Alcohol use: Yes    Comment: He states occassionally, reports last beer was when his brother died.   . Drug use: Yes    Types: Marijuana, Barbituates    Comment: Daily. Last used: yesterday   . Sexual activity: Not on file    Comment: uta, patient not fully oriented  Lifestyle  . Physical activity:    Days per week: Not on file    Minutes per session: Not on file  . Stress: Not on file  Relationships  . Social connections:    Talks on phone: Not on file    Gets together: Not on file    Attends religious service: Not on file    Active member of club or organization: Not on file    Attends meetings of clubs or organizations: Not on file    Relationship status: Not on file  Other Topics Concern  . Not on file  Social History Narrative  . Not on file    Hospital Course: Gabriel Nelson was admitted for Adjustment disorder with mixed disturbance of emotions and conduct  and crisis management.  Pt was treated discharged with the medications listed below under Medication List.  Medical problems were identified and treated as needed.  Home medications were restarted as appropriate.  Improvement was monitored by observation and Gabriel Nelson 's daily report of symptom reduction.  Emotional and mental status was monitored by daily self-inventory reports completed by Gabriel Nelson and clinical staff.         Gabriel Nelson was evaluated by the treatment team for stability and plans for continued recovery upon discharge. Gabriel Nelson 's motivation was an integral factor for scheduling further treatment. Employment, transportation, bed availability, health status, family support, and any pending legal issues were also considered during hospital stay. Pt was offered further treatment options upon discharge including but not limited to Residential, Intensive Outpatient, and Outpatient  treatment.  Gabriel Nelson will follow up with the services as listed below under Follow Up Information.     Upon completion of this admission the patient was both mentally and medically stable for discharge denying suicidal/homicidal ideation, auditory/visual/tactile hallucinations, delusional thoughts and paranoia.    Gabriel Nelson responded well to treatment with Abilify 10 mg and Vistaril 25 mg and trazdone 50 mg without adverse effects. Pt demonstrated improvement without reported or observed adverse effects to the point of stability appropriate for outpatient management. Pertinent labs include: CMP and CBC for which outpatient follow-up is necessary for lab recheck as mentioned below. Reviewed CBC, CMP, BAL, and UDS + THC and Barbiturates ; all unremarkable aside from noted exceptions.   Physical Findings: AIMS:  , ,  ,  ,    CIWA:    COWS:     Musculoskeletal: Strength & Muscle Tone: within normal limits Gait & Station: normal Patient leans:  N/A  Psychiatric Specialty Exam: See SRA by MD  Physical Exam  Nursing note and vitals reviewed. Constitutional: He appears well-developed.  Psychiatric: Judgment and thought content normal.    Review of Systems  Psychiatric/Behavioral: Negative for depression and suicidal ideas.  All other systems reviewed and are negative.   Blood pressure (!) 125/100, pulse 81, temperature 98.4 F (36.9 C), resp. rate 16, height 5\' 10"  (1.778 m), weight 85.3 kg, SpO2 100 %.Body mass index is 26.98 kg/m.     Has this patient used any form of tobacco in the last 30 days? (Cigarettes, Smokeless Tobacco, Cigars, and/or Pipes)  No  Blood Alcohol level:  Lab Results  Component Value Date   ETH <10 08/07/2018   ETH <10 08/04/2018    Metabolic Disorder Labs:  No results found for: HGBA1C, MPG No results found for: PROLACTIN No results found for: CHOL, TRIG, HDL, CHOLHDL, VLDL, LDLCALC  See Psychiatric Specialty Exam and Suicide Risk Assessment completed  by Attending Physician prior to discharge.  Discharge destination:  Home  Is patient on multiple antipsychotic therapies at discharge:  No   Has Patient had three or more failed trials of antipsychotic monotherapy by history:  No  Recommended Plan for Multiple Antipsychotic Therapies: NA  Discharge Instructions    Diet - low sodium heart healthy   Complete by:  As directed    Discharge instructions   Complete by:  As directed    Take all medications as prescribed. Keep all follow-up appointments as scheduled.  Do not consume alcohol or use illegal drugs while on prescription medications. Report any adverse effects from your medications to your primary care provider promptly.  In the event of recurrent symptoms or worsening symptoms, call 911, a crisis hotline, or go to the nearest emergency department for evaluation.   Increase activity slowly   Complete by:  As directed      Allergies as of 08/11/2018   No Known Allergies     Medication List    TAKE these medications     Indication  ARIPiprazole 10 MG tablet Commonly known as:  ABILIFY Take 1 tablet (10 mg total) by mouth daily. Start taking on:  08/12/2018  Indication:  Major Depressive Disorder   hydrOXYzine 25 MG tablet Commonly known as:  ATARAX/VISTARIL Take 1 tablet (25 mg total) by mouth 3 (three) times daily as needed for anxiety.  Indication:  Feeling Anxious   traZODone 50 MG tablet Commonly known as:  DESYREL Take 1 tablet (50 mg total) by mouth at bedtime as needed for sleep.  Indication:  Trouble Sleeping      Follow-up Information    [facility] Follow up.   Contact information: [address] [place] [address] [phone]           Follow-up recommendations:  Activity:  as tolerated Diet:  heart healthy   Comments:  Take all medications as prescribed. Keep all follow-up appointments as scheduled.  Do not consume alcohol or use illegal drugs while on prescription medications. Report  any adverse effects from your medications to your primary care provider promptly.  In the event of recurrent symptoms or worsening symptoms, call 911, a crisis hotline, or go to the nearest emergency department for evaluation.   Signed: Oneta Rack, NP 08/11/2018, 8:58 AM   Patient seen, Suicide Assessment Completed.  Disposition Plan Reviewed

## 2018-08-11 NOTE — Progress Notes (Signed)
  [facility] :  Will you be returning to the same living situation after discharge:  No. Says he will stay with step-mother At discharge, do you have transportation home?: Yes,  family Do you have the ability to pay for your medications: Yes,  MCD  Release of information consent forms completed and in the chart;  Patient's signature needed at discharge.  Patient to Follow up at: Follow-up Information    [facility] Follow up.   Contact information: [address] [place] [address] [phone]           Next level of care provider [facility]:no  Safety Planning and Suicide Prevention discussed: Yes,  yes     Has patient been referred to the Quitline?: Patient refused referral  Patient has been referred for addiction treatment: Yes  Ida RogueRodney B Keigo Whalley, LCSW 08/11/2018, 9:54 AM

## 2018-08-11 NOTE — [facility] Suicide Risk Assessment (Addendum)
[facility] Discharge Suicide Risk Assessment   Principal Problem: Adjustment disorder with mixed disturbance of emotions and conduct Discharge Diagnoses:  Patient Active Problem List   Diagnosis Date Noted  . 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 Adjustment disorder with mixed disturbance of emotions and conduct [F43.25] 08/02/2018  . Rhabdomyolysis [M62.82] 10/27/2016  . Marijuana intoxication (HCC) [F12.929] 10/27/2016  . Osteomyelitis (HCC) [M86.9] 10/23/2015    Total Time spent with patient: 30 minutes  Musculoskeletal: Strength & Muscle Tone: within normal limits Gait & Station: normal Patient leans: N/A  Psychiatric Specialty Exam: Review of Systems  Constitutional: Negative for chills and fever.  Respiratory: Negative for cough and shortness of breath.   Cardiovascular: Negative for chest pain.  Gastrointestinal: Negative for abdominal pain, heartburn, nausea and vomiting.  Psychiatric/Behavioral: Negative for depression, hallucinations and suicidal ideas. The patient is not nervous/anxious and does not have insomnia.     Blood pressure (!) 125/100, pulse 81, temperature 98.4 F (36.9 C), resp. rate 16, height 5\' 10"  (1.778 m), weight 85.3 kg, SpO2 100 %.Body mass index is 26.98 kg/m.  General Appearance: Casual and Fairly Groomed  Patent attorney::  Good  Speech:  Clear and Coherent and Normal Rate  Volume:  Normal  Mood:  Euthymic  Affect:  Appropriate and Congruent  Thought Process:  Coherent and Goal Directed  Orientation:  Full (Time, Place, and Person)  Thought Content:  Logical  Suicidal Thoughts:  No  Homicidal Thoughts:  No  Memory:  Immediate;   Fair Recent;   Fair Remote;   Fair  Judgement:  Poor  Insight:  Lacking  Psychomotor Activity:  Normal  Concentration:  Good  Recall:  Good  Fund of Knowledge:Good  Language: Good  Akathisia:  No  Handed:    AIMS (if indicated):     Assets:  Communication Skills Resilience Social Support   Sleep:  Number of Hours: 6.75  Cognition: WNL  ADL's:  Intact   Mental Status Per Nursing Assessment::   On Admission:  Intention to act on plan to harm others(his brother's murderer)  Demographic Factors:  Male, Adolescent or young adult, Low socioeconomic status and Unemployed  Loss Factors: Loss of significant relationship  Historical Factors: Family history of mental illness or substance abuse and Impulsivity  Risk Reduction Factors:   Sense of responsibility to family, Living with another person, especially a relative, Positive social support, Positive therapeutic relationship and Positive coping skills or problem solving skills  Continued Clinical Symptoms:  Severe Anxiety and/or Agitation Depression:   Severe More than one psychiatric diagnosis  Cognitive Features That Contribute To Risk:  None    Suicide Risk:  Minimal: No identifiable suicidal ideation.  Patients presenting with no risk factors but with morbid ruminations; may be classified as minimal risk based on the severity of the depressive symptoms  Follow-up Information    [facility] Follow up.   Contact information: [address] [place] [address] [phone]         Subjective Data:  Gabriel Nelson is a 17 y/o M with previous psychiatric history of MDD and cannabis use disorder who was admitted from [facility] with worsening mood instability, agitation, disorganized behaviors, and impulsive/disruptive behaviors such as yelling and breaking objects. Pt has been having multiple presentations to the ED (3x in last 1 month). His symptoms are in the context of recent traumatic shooting death of his brother and then being kicked out of home by his mother. Pt was medically cleared and  [facility]. He was started on trial of abilify. Pt reported improvement of his presenting symptoms.  Today upon evaluation, pt shares, "I'm doing good." He denies any specific  concerns. He is sleeping well. His appetite is good. He denies other physical complaints. He denies SI/HI/AH/VH. He is tolerating his medication well, and he feels that it has been helpful. He is in agreement to continue his current regimen without changes. [facility]. He will stay with his step-mother after discharge and he feels this will be a safe arrangement. He specifically was asked about thoughts of violence, and pt denied any thoughts of wanting to hurt anyone else. 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 He was able to engage in safety planning including plan to [facility] or contact emergency services if he feels unable to maintain his own safety or the safety of others. Pt had no further questions, comments, or concerns.   Plan Of Care/Follow-up recommendations:   - Discharge to outpatient level of care  Mood control.      - Continue Abilify 10 mg po daily.  Anxiety.      - Continue Vistaril 25 mg po tid prn anxiety  Insomnia.      - Continue Trazodone 50 mg po prn insomnia  Activity:  as tolerated Diet:  normal Tests:  NA Other:  see above for DC plan  Gabriel Likenshristopher T Jodey Burbano, MD 08/11/2018, 11:24 AM

## 2018-08-11 NOTE — Progress Notes (Signed)
Recreation Therapy Notes  Date: 9.16.19 Time: 1000 Location: 500 Hall Dayroom  Group Topic: Wellness  Goal Area(s) Addresses:  Patient will define components of whole wellness. Patient will verbalize benefit of whole wellness.  Behavioral Response: Engaged  Intervention:  Exercise, Music  Activity: Exercise.  LRT lead the group through a series of stretches.  Each group member would then lead the group in an exercise of their choice.  Patients and LRT went through a number of exercises and dance moves to get the heart pumping and blood flowing.  Education: Wellness, Building control surveyorDischarge Planning.   Education Outcome: Acknowledges education/In group clarification offered/Needs additional education.   Clinical Observations/Feedback:  Pt active and social with peers.  Pt had to be redirected at one point for to many side conversations.   Pt was smiling and engaged during activity.  Pt was also trying to teach his peers some dance moves.      Caroll RancherMarjette Brailyn Killion, LRT/CTRS         Caroll RancherLindsay, Beuford Garcilazo A 08/11/2018 12:43 PM

## 2018-08-11 NOTE — Progress Notes (Signed)
Patient ID: Gabriel Nelson, male   DOB: [birth year], 17 y.o.   MRN: [id number] D: Patient appears calm and cooperative. Pt reports his mood is better. Pt reports he wants to get back to school because he is behind from been suspended. Pt observed in dayroom interacting well with peers. Pt attended evening wrap up group and engaged in discussion. Pt denies SI/HI/AVH.No behavioral issues noted.  A: Support and encouragement offered as needed to express needs. Medications administered as prescribed.  R: Patient is safe and cooperative on unit. Will continue to monitor  for safety and stability.

## 2018-08-12 ENCOUNTER — Emergency Department (HOSPITAL_COMMUNITY)
Admission: EM | Admit: 2018-08-12 | Discharge: 2018-08-13 | Disposition: A | Discharge status: Home / Self Care | Payer: Medicaid Other | Attending: Emergency Medicine | Admitting: Emergency Medicine

## 2018-08-12 DIAGNOSIS — Z046 Encounter for general psychiatric examination, requested by authority: Secondary | ICD-10-CM | POA: Diagnosis not present

## 2018-08-12 DIAGNOSIS — F918 Other conduct disorders: Secondary | ICD-10-CM | POA: Diagnosis present

## 2018-08-12 DIAGNOSIS — F329 Major depressive disorder, single episode, unspecified: Secondary | ICD-10-CM | POA: Diagnosis not present

## 2018-08-12 DIAGNOSIS — F1010 Alcohol abuse, uncomplicated: Secondary | ICD-10-CM | POA: Diagnosis not present

## 2018-08-12 DIAGNOSIS — Z634 Disappearance and death of family member: Secondary | ICD-10-CM | POA: Insufficient documentation

## 2018-08-12 DIAGNOSIS — F17210 Nicotine dependence, cigarettes, uncomplicated: Secondary | ICD-10-CM | POA: Diagnosis not present

## 2018-08-12 DIAGNOSIS — R451 Restlessness and agitation: Secondary | ICD-10-CM | POA: Insufficient documentation

## 2018-08-12 DIAGNOSIS — F4325 Adjustment disorder with mixed disturbance of emotions and conduct: Secondary | ICD-10-CM | POA: Diagnosis not present

## 2018-08-12 DIAGNOSIS — F1220 Cannabis dependence, uncomplicated: Secondary | ICD-10-CM | POA: Diagnosis not present

## 2018-08-12 DIAGNOSIS — R4689 Other symptoms and signs involving appearance and behavior: Secondary | ICD-10-CM

## 2018-08-12 LAB — CBC WITH DIFFERENTIAL/PLATELET
BASOS PCT: 0 %
Basophils Absolute: 0 10*3/uL (ref 0.0–0.1)
EOS ABS: 0.1 10*3/uL (ref 0.0–0.7)
Eosinophils Relative: 2 %
HCT: 43.4 % (ref 39.0–52.0)
HEMOGLOBIN: 15.1 g/dL (ref 13.0–17.0)
Lymphocytes Relative: 24 %
Lymphs Abs: 2 10*3/uL (ref 0.7–4.0)
MCH: 30 pg (ref 26.0–34.0)
MCHC: 34.8 g/dL (ref 30.0–36.0)
MCV: 86.3 fL (ref 78.0–100.0)
MONOS PCT: 16 %
Monocytes Absolute: 1.3 10*3/uL — ABNORMAL HIGH (ref 0.1–1.0)
Neutro Abs: 4.7 10*3/uL (ref 1.7–7.7)
Neutrophils Relative %: 58 %
Platelets: 142 10*3/uL — ABNORMAL LOW (ref 150–400)
RBC: 5.03 MIL/uL (ref 4.22–5.81)
RDW: 12.5 % (ref 11.5–15.5)
WBC: 8.2 10*3/uL (ref 4.0–10.5)

## 2018-08-12 LAB — COMPREHENSIVE METABOLIC PANEL
ALT: 39 U/L (ref 0–44)
AST: 53 U/L — AB (ref 15–41)
Albumin: 4.5 g/dL (ref 3.5–5.0)
Alkaline Phosphatase: 50 U/L (ref 38–126)
Anion gap: 11 (ref 5–15)
BUN: 13 mg/dL (ref 6–20)
CHLORIDE: 102 mmol/L (ref 98–111)
CO2: 28 mmol/L (ref 22–32)
CREATININE: 1.06 mg/dL (ref 0.61–1.24)
Calcium: 9.6 mg/dL (ref 8.9–10.3)
GFR calc Af Amer: >60 mL/min (ref >60)
GFR calc non Af Amer: >60 mL/min (ref >60)
GLUCOSE: 87 mg/dL (ref 70–99)
Potassium: 3.8 mmol/L (ref 3.5–5.1)
SODIUM: 141 mmol/L (ref 135–145)
Total Bilirubin: 0.6 mg/dL (ref 0.3–1.2)
Total Protein: 7.9 g/dL (ref 6.5–8.1)

## 2018-08-12 LAB — RAPID URINE DRUG SCREEN, HOSP PERFORMED
AMPHETAMINES: NOT DETECTED
Barbiturates: NOT DETECTED
Benzodiazepines: NOT DETECTED
Cocaine: NOT DETECTED
Opiates: NOT DETECTED
TETRAHYDROCANNABINOL: POSITIVE — AB

## 2018-08-12 LAB — ETHANOL: Alcohol, Ethyl (B): <10 mg/dL (ref <10)

## 2018-08-12 MED ORDER — TRAZODONE HCL 50 MG PO TABS
50.0000 mg | ORAL_TABLET | Freq: Every evening | ORAL | Status: DC | PRN
  Administered 2018-08-12: 50 mg via ORAL
  Filled 2018-08-12: qty 1

## 2018-08-12 MED ORDER — HYDROXYZINE HCL 25 MG PO TABS: 25.0000 mg | ORAL_TABLET | Freq: Three times a day (TID) | ORAL | Status: DC | PRN

## 2018-08-12 MED ORDER — ARIPIPRAZOLE 10 MG PO TABS
10.0000 mg | ORAL_TABLET | Freq: Every day | ORAL | Status: DC
  Administered 2018-08-12 – 2018-08-13 (×2): 10 mg via ORAL
  Filled 2018-08-12 (×2): qty 1

## 2018-08-12 NOTE — ED Notes (Signed)
Report to [name] in secure area-patient to finish his bag of IVF's then transfer over to secure area

## 2018-08-12 NOTE — ED Notes (Addendum)
BAGS X 2 LABELED AND PLACED IN CABINET ABOVE NURSING STATION 19-22

## 2018-08-12 NOTE — ED Triage Notes (Signed)
Pt [facility] by [employer], [facility], pt was fighting with his mother at the facility and acting out. Pt cooperative upon arrival.

## 2018-08-12 NOTE — ED Notes (Signed)
Patient is awake and alert-cooperative and pleasant-VSS

## 2018-08-12 NOTE — ED Provider Notes (Signed)
[facility]-EMERGENCY DEPT Provider Note   CSN: [id number] Arrival date & time: 08/12/18  1746     History   Chief Complaint Chief Complaint  Patient presents with  . Psychiatric Evaluation    HPI Gabriel Nelson is a 17 y.o. male.  Pt presents to the ED today from [facility].  He was brought in by [employer].  He was fighting with his mother there.  He was just discharged from the psych unit here for aggressive behavior.  He said his mom was "cutting up."  He has recently been dealing with the death of his brother and he has been kicked out of his mom's house.  He has nowhere to live.  He is not suicidal or homicidal.     Past Medical History:  Diagnosis Date  . ADHD   . Medical history non-contributory     Patient Active Problem List   Diagnosis Date Noted  . Moderate cannabis use disorder (HCC) 08/08/2018  . 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 Rhabdomyolysis 10/27/2016  . Marijuana intoxication (HCC) 10/27/2016  . Osteomyelitis (HCC) 10/23/2015    No past surgical history on file.      Home Medications    Prior to Admission medications   Medication Sig Start Date End Date Taking? Authorizing Provider  ARIPiprazole (ABILIFY) 10 MG tablet Take 1 tablet (10 mg total) by mouth daily. 08/12/18   Oneta Rack, NP  hydrOXYzine (ATARAX/VISTARIL) 25 MG tablet Take 1 tablet (25 mg total) by mouth 3 (three) times daily as needed for anxiety. 08/11/18   Oneta Rack, NP  traZODone (DESYREL) 50 MG tablet Take 1 tablet (50 mg total) by mouth at bedtime as needed for sleep. 08/11/18   Oneta Rack, NP    Family History No family history on file.  Social History Social History   Tobacco Use  . Smoking status: Current Every Day Smoker    Packs/day: 0.25    Years: 4.00    Pack years: 1.00    Types: Cigarettes, Cigars  . Smokeless tobacco: Never Used  Substance Use Topics  . Alcohol use: Yes    Comment: He  states occassionally, reports last beer was when his brother died.   . Drug use: Yes    Types: Marijuana, Barbituates    Comment: Daily. Last used: yesterday      Allergies   Patient has no known allergies.   Review of Systems Review of Systems  Psychiatric/Behavioral: Positive for agitation and behavioral problems.  All other systems reviewed and are negative.    Physical Exam Updated Vital Signs BP (!) 153/68 (BP Location: Left Arm)   Pulse 79   Temp 98.2 F (36.8 C) (Oral)   Resp 18   SpO2 99%   Physical Exam  Constitutional: He is oriented to person, place, and time. He appears well-developed and well-nourished.  HENT:  Head: Normocephalic and atraumatic.  Right Ear: External ear normal.  Left Ear: External ear normal.  Nose: Nose normal.  Mouth/Throat: Oropharynx is clear and moist.  Eyes: Pupils are equal, round, and reactive to light. Conjunctivae and EOM are normal.  Neck: Normal range of motion. Neck supple.  Cardiovascular: Normal rate, regular rhythm, normal heart sounds and intact distal pulses.  Pulmonary/Chest: Effort normal and breath sounds normal.  Abdominal: Soft. Bowel sounds are normal.  Musculoskeletal: Normal range of motion.  Neurological: He is alert and oriented to person, place, and time.  Skin: Skin  is warm. Capillary refill takes less than 2 seconds.  Psychiatric: He has a normal mood and affect. His speech is normal and behavior is normal. Thought content normal. Cognition and memory are normal. He expresses impulsivity.  Nursing note and vitals reviewed.    ED Treatments / Results  Labs (all labs ordered are listed, but only abnormal results are displayed) Labs Reviewed  COMPREHENSIVE METABOLIC PANEL - Abnormal; Notable for the following components:      Result Value   AST 53 (*)    All other components within normal limits  RAPID URINE DRUG SCREEN, HOSP PERFORMED - Abnormal; Notable for the following components:    Tetrahydrocannabinol POSITIVE (*)    All other components within normal limits  CBC WITH DIFFERENTIAL/PLATELET - Abnormal; Notable for the following components:   Platelets 142 (*)    Monocytes Absolute 1.3 (*)    All other components within normal limits  ETHANOL    EKG None  Radiology No results found.  Procedures Procedures (including critical care time)  Medications Ordered in ED Medications  ARIPiprazole (ABILIFY) tablet 10 mg (has no administration in time range)  hydrOXYzine (ATARAX/VISTARIL) tablet 25 mg (has no administration in time range)  traZODone (DESYREL) tablet 50 mg (has no administration in time range)     Initial Impression / Assessment and Plan / ED Course  I have reviewed the triage vital signs and the nursing notes.  Pertinent labs & imaging results that were available during my care of the patient were reviewed by me and considered in my medical decision making (see chart for details).     Pt had a TTS consult and they want him to be observed overnight and reevaluated in the morning.  Pt is voluntary.  He is willing to stay.  If he wants to leave, he does not meet IVC criteria.    Final Clinical Impressions(s) / ED Diagnoses   Final diagnoses:  Aggressive behavior    ED Discharge Orders    None       Jacalyn LefevreHaviland, Sherra Kimmons, MD 08/12/18 2052

## 2018-08-12 NOTE — ED Notes (Signed)
Pt A&O x 3, no distress noted, calm & cooperative, presents for evaluation after exhibiting aggressive behavior towards his mother.  Monitoring for safety, Q 15 min checks in effect.

## 2018-08-12 NOTE — ED Notes (Signed)
[employer] is at bedside with patient.

## 2018-08-12 NOTE — Progress Notes (Signed)
TTS consulted with Nira ConnJason Berry, NP who recommends continued observation for safety and to be reassessed in the AM by psych. EDP Jacalyn LefevreHaviland, Julie, MD and pt's nurse Ronnell FreshwaterLondon, [name] L, RN have been advised.   Princess BruinsAquicha Oryan Winterton, MSW, LCSW Therapeutic Triage Specialist  [phone]

## 2018-08-12 NOTE — ED Notes (Signed)
PT WAS SENT TO [facility] FOR EVALUATION FOR INCREASED PSYCHOSIS AND AGGRESSIVE BEHAVIOR TOWARDS MOTHER. IVC PAPERS WERE GOING TO BE TAKEN OUT HOWEVER PERSON TAKING OUT IVC PAPERS "HAD NEAR SYNCOPAL EPISODE AND WAS SENT TO [place] FOR EVALUATION". NO IVC PAPERS WERE FILED.

## 2018-08-12 NOTE — ED Notes (Signed)
Patient is Voluntary. 

## 2018-08-12 NOTE — ED Notes (Signed)
Patient is Voluntary.

## 2018-08-12 NOTE — ED Triage Notes (Signed)
Patient Belongings: Gabriel Nelson Navy [employer]Blue shirt Black jeans Black socks Rose gold iphone (Cracked screen) w/ black case 3 silver and black Pendants

## 2018-08-12 NOTE — Progress Notes (Addendum)
TTS attempting to assess pt who is currently in the hallway. TTS spoke with Gabriel CoddingtonLatricia, RN who states the pt is in the process of being transferred to 5336. TTS will call telepsych cart in 10 mins for the assessment.   [name], MSW, LCSW Therapeutic Triage Specialist  [phone]

## 2018-08-12 NOTE — ED Notes (Signed)
[facility] Expected date:  Expected time:  Means of arrival:  Comments: PD Katrinka [place]- 17yo behavioral

## 2018-08-12 NOTE — ED Notes (Signed)
ED Provider at bedside. HAVILAND

## 2018-08-12 NOTE — ED Notes (Signed)
Bed: [facility] Expected date:  Expected time:  Means of arrival:  Comments: Hall D 
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
## 2018-08-13 ENCOUNTER — Other Ambulatory Visit: Payer: Self-pay

## 2018-08-13 DIAGNOSIS — F17210 Nicotine dependence, cigarettes, uncomplicated: Secondary | ICD-10-CM

## 2018-08-13 DIAGNOSIS — F1010 Alcohol abuse, uncomplicated: Secondary | ICD-10-CM

## 2018-08-13 DIAGNOSIS — F4325 Adjustment disorder with mixed disturbance of emotions and conduct: Secondary | ICD-10-CM

## 2018-08-13 DIAGNOSIS — F1220 Cannabis dependence, uncomplicated: Secondary | ICD-10-CM

## 2018-08-13 NOTE — ED Notes (Signed)
Pt discharged home. Discharged instructions read to pt who verbalized understanding. All belongings returned to pt who signed for same. Denies SI/HI, is not delusional and not responding to internal stimuli. Escorted pt to the ED exit.    
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
## 2018-08-13 NOTE — ED Notes (Signed)
Pt has been calm and cooperative. Denies SI/HI/AVH.

## 2018-09-12 ENCOUNTER — Emergency Department (HOSPITAL_COMMUNITY)
Admission: EM | Admit: 2018-09-12 | Discharge: 2018-09-12 | Disposition: A | Discharge status: Home / Self Care | Payer: Medicaid Other | Attending: Emergency Medicine | Admitting: Emergency Medicine

## 2018-09-12 ENCOUNTER — Encounter (HOSPITAL_COMMUNITY): Payer: Self-pay | Admitting: *Deleted

## 2018-09-12 DIAGNOSIS — Y92410 Unspecified street and highway as the place of occurrence of the external cause: Secondary | ICD-10-CM | POA: Diagnosis not present

## 2018-09-12 DIAGNOSIS — F17210 Nicotine dependence, cigarettes, uncomplicated: Secondary | ICD-10-CM | POA: Insufficient documentation

## 2018-09-12 DIAGNOSIS — Y939 Activity, unspecified: Secondary | ICD-10-CM | POA: Diagnosis not present

## 2018-09-12 DIAGNOSIS — Z79899 Other long term (current) drug therapy: Secondary | ICD-10-CM | POA: Insufficient documentation

## 2018-09-12 DIAGNOSIS — Y998 Other external cause status: Secondary | ICD-10-CM | POA: Insufficient documentation

## 2018-09-12 DIAGNOSIS — M545 Low back pain, unspecified: Secondary | ICD-10-CM

## 2018-09-12 DIAGNOSIS — M542 Cervicalgia: Secondary | ICD-10-CM
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
## 2019-08-20 ENCOUNTER — Other Ambulatory Visit: Payer: Self-pay | Admitting: Emergency Medicine

## 2019-08-20 ENCOUNTER — Encounter (HOSPITAL_COMMUNITY): Payer: Self-pay | Admitting: Emergency Medicine

## 2019-08-20 ENCOUNTER — Other Ambulatory Visit: Payer: Self-pay

## 2019-08-20 ENCOUNTER — Ambulatory Visit (HOSPITAL_COMMUNITY)
Admission: EM | Admit: 2019-08-20 | Discharge: 2019-08-20 | Disposition: A | Discharge status: Home / Self Care | Payer: Medicaid Other | Attending: Emergency Medicine | Admitting: Emergency Medicine

## 2019-08-20 DIAGNOSIS — N342 Other urethritis: Secondary | ICD-10-CM | POA: Diagnosis present

## 2019-08-20 DIAGNOSIS — Z202 Contact with and (suspected) exposure to infections with a predominantly sexual mode of transmission: Secondary | ICD-10-CM

## 2019-08-20 DIAGNOSIS — R369 Urethral discharge, unspecified: Secondary | ICD-10-CM

## 2019-08-20 DIAGNOSIS — Z113 Encounter for screening for infections with a predominantly sexual mode of transmission: Secondary | ICD-10-CM | POA: Diagnosis present

## 2019-08-20 LAB — POCT URINALYSIS DIP (DEVICE)
Bilirubin Urine: NEGATIVE
Glucose, UA: NEGATIVE mg/dL
Hgb urine dipstick: NEGATIVE
Ketones, ur: NEGATIVE mg/dL
Leukocytes,Ua: NEGATIVE
Nitrite: NEGATIVE
Protein, ur: NEGATIVE mg/dL
Specific Gravity, Urine: >=1.03 (ref 1.005–1.030)
Urobilinogen, UA: 1 mg/dL (ref 0.0–1.0)
pH: 6.5 (ref 5.0–8.0)

## 2019-08-20 MED ORDER — CEFTRIAXONE SODIUM 250 MG IJ SOLR
INTRAMUSCULAR | Status: AC
  Filled 2019-08-20: qty 250

## 2019-08-20 MED ORDER — CEFTRIAXONE SODIUM 250 MG IJ SOLR
250.0000 mg | Freq: Once | INTRAMUSCULAR | Status: AC
  Administered 2019-08-20: 250 mg via INTRAMUSCULAR

## 2019-08-20 MED ORDER — AZITHROMYCIN 250 MG PO TABS
1000.0000 mg | ORAL_TABLET | Freq: Once | ORAL | Status: AC
  Administered 2019-08-20: 1000 mg via ORAL

## 2019-08-20 MED ORDER — LIDOCAINE HCL 2 % IJ SOLN
INTRAMUSCULAR | Status: AC
  Filled 2019-08-20: qty 20

## 2019-08-20 MED ORDER — AZITHROMYCIN 250 MG PO TABS
ORAL_TABLET | ORAL | Status: AC
  Filled 2019-08-20: qty 4

## 2019-08-20 MED ORDER — LIDOCAINE HCL (PF) 2 % IJ SOLN
INTRAMUSCULAR | Status: AC
  Filled 2019-08-20: qty 2

## 2019-08-20 NOTE — ED Provider Notes (Signed)
HPI  SUBJECTIVE:  Gabriel Nelson is a 18 y.o. male who presents with clear penile discharge, dysuria starting yesterday.  No urinary urgency frequency, cloudy or odorous urine, hematuria.  No penile rash, pain.  No testicular pain or swelling.  No scrotal pain or swelling.  No abdominal, back, pelvic pain.  He is in a monogamous relationship of 5 months with a male who is asymptomatic, however STDs are a concern today.  He has not tried anything for symptoms.  No aggravating or alleviating factors.  Patient denies masturbating with soap.  Past medical history negative for gonorrhea, chlamydia, HIV, HSV, trichomonas, syphilis, UTI.  PMD: Dion Body, MD     Past Medical History:  Diagnosis Date  . ADHD   . Medical history non-contributory     History reviewed. No pertinent surgical history.  Family History  Problem Relation Age of Onset  . Healthy Mother     Social History   Tobacco Use  . Smoking status: Current Every Day Smoker    Packs/day: 0.25    Years: 4.00    Pack years: 1.00    Types: Cigarettes, Cigars  . Smokeless tobacco: Never Used  Substance Use Topics  . Alcohol use: Yes    Comment: He states occassionally, reports last beer was when his brother died.   . Drug use: Yes    Types: Marijuana, Barbituates    Comment: Daily. Last used: yesterday     No current facility-administered medications for this encounter.   Current Outpatient Medications:  .  ARIPiprazole (ABILIFY) 10 MG tablet, Take 1 tablet (10 mg total) by mouth daily., Disp: 30 tablet, Rfl: 0 .  hydrOXYzine (ATARAX/VISTARIL) 25 MG tablet, Take 1 tablet (25 mg total) by mouth 3 (three) times daily as needed for anxiety., Disp: 30 tablet, Rfl: 0 .  traZODone (DESYREL) 50 MG tablet, Take 1 tablet (50 mg total) by mouth at bedtime as needed for sleep., Disp: 30 tablet, Rfl: 0  No Known Allergies   ROS  As noted in HPI.   Physical Exam  BP (!) 146/63 (BP Location: Left Arm)   Pulse 70   Temp  97.7 F (36.5 C) (Tympanic)   Resp 17   SpO2 96%   Constitutional: Well developed, well nourished, no acute distress Eyes:  EOMI, conjunctiva normal bilaterally HENT: Normocephalic, atraumatic,mucus membranes moist Respiratory: Normal inspiratory effort Cardiovascular: Normal rate GI: nondistended Lymph: No inguinal lymphadenopathy GU: Normal circumcised male.  Positive purulent penile discharge.  No penile rash.  No testicular, epididymal tenderness, swelling.  Normal scrotum.  Patient declined chaperone. skin: No rash, skin intact Musculoskeletal: no deformities Neurologic: Alert & oriented x 3, no focal neuro deficits Psychiatric: Speech and behavior appropriate   ED Course   Medications  cefTRIAXone (ROCEPHIN) injection 250 mg (250 mg Intramuscular Given 08/20/19 1157)  azithromycin (ZITHROMAX) tablet 1,000 mg (1,000 mg Oral Given 08/20/19 1157)  azithromycin (ZITHROMAX) 250 MG tablet (has no administration in time range)  cefTRIAXone (ROCEPHIN) 250 MG injection (has no administration in time range)  lidocaine (XYLOCAINE) 2 % injection (has no administration in time range)  lidocaine (XYLOCAINE) 2 % (with pres) injection (has no administration in time range)    Orders Placed This Encounter  Procedures  . RPR    Standing Status:   Standing    Number of Occurrences:   1  . HIV antibody    Standing Status:   Standing    Number of Occurrences:   1  . POCT urinalysis  dip (device)    Standing Status:   Standing    Number of Occurrences:   1    Results for orders placed or performed during the hospital encounter of 08/20/19 (from the past 24 hour(s))  POCT urinalysis dip (device)     Status: None   Collection Time: 08/20/19 12:17 PM  Result Value Ref Range   Glucose, UA NEGATIVE NEGATIVE mg/dL   Bilirubin Urine NEGATIVE NEGATIVE   Ketones, ur NEGATIVE NEGATIVE mg/dL   Specific Gravity, Urine >=1.030 1.005 - 1.030   Hgb urine dipstick NEGATIVE NEGATIVE   pH 6.5 5.0 -  8.0   Protein, ur NEGATIVE NEGATIVE mg/dL   Urobilinogen, UA 1.0 0.0 - 1.0 mg/dL   Nitrite NEGATIVE NEGATIVE   Leukocytes,Ua NEGATIVE NEGATIVE   No results found.  ED Clinical Impression  1. Penile discharge   2. Urethritis   3. Screen for STD (sexually transmitted disease)      ED Assessment/Plan  Suspect gonorrhea or chlamydia.  Will treat for both with Rocephin and azithromycin.  GC, chlamydia, trichomonas sent.  Also checking HIV, RPR.  Advised patient to no not have intercourse until symptoms resolve and he knows what his lab results are, and his partner is treated if necessary.  Follow-up with PMD PRN.  Also checking UA. UA negative for UTI.  Discussed labs, MDM, treatment plan, and plan for follow-up with patient. patient agrees with plan.   Meds ordered this encounter  Medications  . cefTRIAXone (ROCEPHIN) injection 250 mg    Order Specific Question:   Antibiotic Indication:    Answer:   STD  . azithromycin (ZITHROMAX) tablet 1,000 mg    *This clinic note was created using Scientist, clinical (histocompatibility and immunogenetics). Therefore, there may be occasional mistakes despite careful proofreading.   ?    Domenick Gong, MD 08/20/19 1243

## 2019-08-20 NOTE — Discharge Instructions (Addendum)
We have treated you empirically for gonorrhea and chlamydia today.  These tests, trichomonas, HIV, syphilis tests are still pending.  This will take several days.  We will contact you if anything comes back positive.  Give [place] a working phone number so that we can contact you.  If any of your labs are positive your partner will need to be treated.  No intercourse until your symptoms resolve and you know what your labs are.

## 2019-08-20 NOTE — ED Triage Notes (Signed)
Pt here for STD screening; pt sts penile discharge  

## 2019-08-21 LAB — HIV ANTIBODY (ROUTINE TESTING W REFLEX): HIV Screen 4th Generation wRfx: NONREACTIVE

## 2019-08-21 LAB — RPR: RPR Ser Ql: NONREACTIVE

## 2019-08-21 LAB — CYTOLOGY, (ORAL, ANAL, URETHRAL) ANCILLARY ONLY
Chlamydia: NEGATIVE
Neisseria Gonorrhea: POSITIVE — AB
Trichomonas: NEGATIVE

## 2019-08-24 ENCOUNTER — Telehealth (HOSPITAL_COMMUNITY): Payer: Self-pay | Admitting: Emergency Medicine

## 2019-08-24 NOTE — Telephone Encounter (Signed)
Test for gonorrhea was positive. This was treated at the urgent care visit with IM rocephin 250mg  and po zithromax 1g. Pt needs education to refrain from sexual intercourse for 7 days after treatment to give the medicine time to work.  Sexual partners need to be notified and tested/treated.  Condoms may reduce risk of reinfection.  Recheck or followup with PCP for further evaluation if symptoms are not improving. [facility] notified.   Called number on file, mother answered, stated she would give him the message to call me back.

## 2019-08-27 ENCOUNTER — Telehealth (HOSPITAL_COMMUNITY): Payer: Self-pay | Admitting: Emergency Medicine

## 2019-08-27 NOTE — Telephone Encounter (Signed)
Patient contacted and made aware of   cytology results, all questions answered  

## 2020-03-25 ENCOUNTER — Other Ambulatory Visit: Payer: Self-pay

## 2020-03-25 ENCOUNTER — Emergency Department (HOSPITAL_COMMUNITY): Payer: Medicaid Other

## 2020-03-25 ENCOUNTER — Emergency Department (HOSPITAL_COMMUNITY)
Admission: EM | Admit: 2020-03-25 | Discharge: 2020-03-26 | Disposition: A | Discharge status: Home / Self Care | Payer: Medicaid Other | Attending: Emergency Medicine | Admitting: Emergency Medicine

## 2020-03-25 DIAGNOSIS — Z20822 Contact with and (suspected) exposure to covid-19: Secondary | ICD-10-CM | POA: Insufficient documentation

## 2020-03-25 DIAGNOSIS — F23 Brief psychotic disorder: Secondary | ICD-10-CM | POA: Diagnosis not present

## 2020-03-25 DIAGNOSIS — F17210 Nicotine dependence, cigarettes, uncomplicated: Secondary | ICD-10-CM | POA: Insufficient documentation

## 2020-03-25 DIAGNOSIS — F1220 Cannabis dependence, uncomplicated: Secondary | ICD-10-CM | POA: Diagnosis not present

## 2020-03-25 DIAGNOSIS — R456 Violent behavior: Secondary | ICD-10-CM | POA: Diagnosis present

## 2020-03-25 DIAGNOSIS — R4689 Other symptoms and signs involving appearance and behavior: Secondary | ICD-10-CM

## 2020-03-25 DIAGNOSIS — F4325 Adjustment disorder with mixed disturbance of emotions and conduct: Secondary | ICD-10-CM | POA: Insufficient documentation

## 2020-03-25 DIAGNOSIS — R4182 Altered mental status, unspecified: Secondary | ICD-10-CM | POA: Diagnosis not present

## 2020-03-25 LAB — COMPREHENSIVE METABOLIC PANEL
ALT: 26 U/L (ref 0–44)
AST: 35 U/L (ref 15–41)
Albumin: 4.5 g/dL (ref 3.5–5.0)
Alkaline Phosphatase: 50 U/L (ref 38–126)
Anion gap: 15 (ref 5–15)
BUN: 15 mg/dL (ref 6–20)
CO2: 21 mmol/L — ABNORMAL LOW (ref 22–32)
Calcium: 9.3 mg/dL (ref 8.9–10.3)
Chloride: 102 mmol/L (ref 98–111)
Creatinine, Ser: 1.65 mg/dL — ABNORMAL HIGH (ref 0.61–1.24)
GFR calc Af Amer: >60 mL/min (ref >60)
GFR calc non Af Amer: 59 mL/min — ABNORMAL LOW (ref >60)
Glucose, Bld: 113 mg/dL — ABNORMAL HIGH (ref 70–99)
Potassium: 2.6 mmol/L — CL (ref 3.5–5.1)
Sodium: 138 mmol/L (ref 135–145)
Total Bilirubin: 1.5 mg/dL — ABNORMAL HIGH (ref 0.3–1.2)
Total Protein: 7.6 g/dL (ref 6.5–8.1)

## 2020-03-25 LAB — URINALYSIS, ROUTINE W REFLEX MICROSCOPIC
Bacteria, UA: NONE SEEN
Bilirubin Urine: NEGATIVE
Glucose, UA: NEGATIVE mg/dL
Hgb urine dipstick: NEGATIVE
Ketones, ur: 20 mg/dL — AB
Leukocytes,Ua: NEGATIVE
Nitrite: NEGATIVE
Protein, ur: 30 mg/dL — AB
Specific Gravity, Urine: 1.017 (ref 1.005–1.030)
pH: 6 (ref 5.0–8.0)

## 2020-03-25 LAB — CBC WITH DIFFERENTIAL/PLATELET
Abs Immature Granulocytes: 0.03 10*3/uL (ref 0.00–0.07)
Basophils Absolute: 0 10*3/uL (ref 0.0–0.1)
Basophils Relative: 1 %
Eosinophils Absolute: 0 10*3/uL (ref 0.0–0.5)
Eosinophils Relative: 0 %
HCT: 44.8 % (ref 39.0–52.0)
Hemoglobin: 14.4 g/dL (ref 13.0–17.0)
Immature Granulocytes: 0 %
Lymphocytes Relative: 7 %
Lymphs Abs: 0.5 10*3/uL — ABNORMAL LOW (ref 0.7–4.0)
MCH: 28.9 pg (ref 26.0–34.0)
MCHC: 32.1 g/dL (ref 30.0–36.0)
MCV: 90 fL (ref 80.0–100.0)
Monocytes Absolute: 0.7 10*3/uL (ref 0.1–1.0)
Monocytes Relative: 9 %
Neutro Abs: 6.4 10*3/uL (ref 1.7–7.7)
Neutrophils Relative %: 83 %
Platelets: 138 10*3/uL — ABNORMAL LOW (ref 150–400)
RBC: 4.98 MIL/uL (ref 4.22–5.81)
RDW: 12.3 % (ref 11.5–15.5)
WBC: 7.6 10*3/uL (ref 4.0–10.5)
nRBC: 0 % (ref 0.0–0.2)

## 2020-03-25 LAB — RESPIRATORY PANEL BY RT PCR (FLU A&B, COVID)
Influenza A by PCR: NEGATIVE
Influenza B by PCR: NEGATIVE
SARS Coronavirus 2 by RT PCR: NEGATIVE

## 2020-03-25 LAB — CBG MONITORING, ED: Glucose-Capillary: 162 mg/dL — ABNORMAL HIGH (ref 70–99)

## 2020-03-25 LAB — ETHANOL: Alcohol, Ethyl (B): <10 mg/dL (ref <10)

## 2020-03-25 LAB — CK: Total CK: 586 U/L — ABNORMAL HIGH (ref 49–397)

## 2020-03-25 LAB — ACETAMINOPHEN LEVEL: Acetaminophen (Tylenol), Serum: <10 ug/mL — ABNORMAL LOW (ref 10–30)

## 2020-03-25 LAB — MAGNESIUM: Magnesium: 1.6 mg/dL — ABNORMAL LOW (ref 1.7–2.4)

## 2020-03-25 LAB — SALICYLATE LEVEL: Salicylate Lvl: <7 mg/dL — ABNORMAL LOW (ref 7.0–30.0)

## 2020-03-25 MED ORDER — SODIUM CHLORIDE 0.9 % IV BOLUS
1000.0000 mL | Freq: Once | INTRAVENOUS | Status: AC
  Administered 2020-03-25: 19:00:00 1000 mL via INTRAVENOUS

## 2020-03-25 MED ORDER — HALOPERIDOL LACTATE 5 MG/ML IJ SOLN
5.0000 mg | Freq: Once | INTRAMUSCULAR | Status: AC
  Administered 2020-03-25: 17:00:00 5 mg via INTRAVENOUS

## 2020-03-25 MED ORDER — POTASSIUM CHLORIDE 10 MEQ/100ML IV SOLN
10.0000 meq | INTRAVENOUS | Status: AC
  Administered 2020-03-25 (×4): 10 meq via INTRAVENOUS
  Filled 2020-03-25 (×4): qty 100

## 2020-03-25 MED ORDER — MAGNESIUM SULFATE 2 GM/50ML IV SOLN
2.0000 g | Freq: Once | INTRAVENOUS | Status: AC
  Administered 2020-03-25: 19:00:00 2 g via INTRAVENOUS
  Filled 2020-03-25: qty 50

## 2020-03-25 MED ORDER — POTASSIUM CHLORIDE CRYS ER 20 MEQ PO TBCR
40.0000 meq | EXTENDED_RELEASE_TABLET | Freq: Once | ORAL | Status: AC
  Administered 2020-03-25: 22:00:00 40 meq via ORAL
  Filled 2020-03-25: qty 2

## 2020-03-25 MED ORDER — ONDANSETRON HCL 4 MG/2ML IJ SOLN
4.0000 mg | Freq: Once | INTRAMUSCULAR | Status: AC
  Administered 2020-03-25: 23:00:00 4 mg via INTRAVENOUS
  Filled 2020-03-25: qty 2

## 2020-03-25 MED ORDER — HALOPERIDOL LACTATE 5 MG/ML IJ SOLN
INTRAMUSCULAR | Status: AC
  Filled 2020-03-25: qty 1

## 2020-03-25 MED ORDER — LORAZEPAM 2 MG/ML IJ SOLN
1.0000 mg | Freq: Once | INTRAMUSCULAR | Status: AC
  Administered 2020-03-25: 17:00:00 1 mg via INTRAVENOUS
  Filled 2020-03-25: qty 1

## 2020-03-25 NOTE — ED Notes (Signed)
Taken to CT.
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
## 2020-03-25 NOTE — ED Provider Notes (Signed)
[facility] EMERGENCY DEPARTMENT Provider Note   CSN: [id number] Arrival date & time: 03/25/20  1645     History Chief Complaint  Patient presents with  . Aggressive Behavior    Gabriel Nelson is a 19 y.o. male.  Presented to ER with concern for aggressive behavior.  History limited due to acuity, altered mental status.  Level 5 caveat.  Additional history obtained by family, EMS report.  Mother reported patient has been relatively stressed lately, acting somewhat strange yesterday but then this morning was acting appropriately.  This afternoon states that concerning he was yelling at voices, demonstrating aggressive behavior.  Did not harm himself or anyone else.  She is unaware of any attempted overdose.  No reported suicidal thoughts.  EMS reported patient very combative on arrival, gave large dose IM ketamine.  Past medical history of adjustment disorder, cannabis abuse.  HPI     Past Medical History:  Diagnosis Date  . ADHD   . Medical history non-contributory     Patient Active Problem List   Diagnosis Date Noted  . 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 Rhabdomyolysis 10/27/2016  . Marijuana intoxication ([place]) 10/27/2016  . Osteomyelitis ([place]) 10/23/2015    No past surgical history on file.     Family History  Problem Relation Age of Onset  . Healthy Mother     Social History   Tobacco Use  . Smoking status: Current Every Day Smoker    Packs/day: 0.25    Years: 4.00    Pack years: 1.00    Types: Cigarettes, Cigars  . Smokeless tobacco: Never Used  Substance Use Topics  . Alcohol use: Yes    Comment: He states occassionally, reports last beer was when his brother died.   . Drug use: Yes    Types: Marijuana, Barbituates    Comment: Daily. Last used: yesterday     Home Medications Prior to Admission medications   Medication Sig Start  Date End Date Taking? Authorizing Provider  ibuprofen (ADVIL) 200 MG tablet Take 400 mg by mouth every 6 (six) hours as needed for headache (pain).   Yes [provider]  ARIPiprazole (ABILIFY) 10 MG tablet Take 1 tablet (10 mg total) by mouth daily. Patient not taking: Reported on 03/25/2020 08/12/18   Derrill Center, NP  hydrOXYzine (ATARAX/VISTARIL) 25 MG tablet Take 1 tablet (25 mg total) by mouth 3 (three) times daily as needed for anxiety. Patient not taking: Reported on 03/25/2020 08/11/18   Derrill Center, NP  traZODone (DESYREL) 50 MG tablet Take 1 tablet (50 mg total) by mouth at bedtime as needed for sleep. Patient not taking: Reported on 03/25/2020 08/11/18   Derrill Center, NP    Allergies    Patient has no known allergies.  Review of Systems   Review of Systems  Unable to perform ROS: Acuity of condition    Physical Exam Updated Vital Signs BP (!) 114/55   Pulse 78   Temp 100.1 F (37.8 C) (Oral)   Resp 13   SpO2 98%   Physical Exam Vitals and nursing note reviewed.  Constitutional:      General: He is not in acute distress.    Appearance: He is well-developed.     Comments: Unresponsive, resting in bed  HENT:     Head: Normocephalic and atraumatic.  Eyes:     Conjunctiva/sclera: Conjunctivae normal.  Cardiovascular:  Rate and Rhythm: Normal rate and regular rhythm.     Heart sounds: No murmur.  Pulmonary:     Effort: Pulmonary effort is normal. No respiratory distress.     Breath sounds: Normal breath sounds.  Abdominal:     Palpations: Abdomen is soft.     Tenderness: There is no abdominal tenderness.  Musculoskeletal:     Cervical back: Neck supple.  Skin:    General: Skin is warm and dry.  Neurological:     Comments: Localizes pain, opens eyes to pain, mumbles incomprehensible sounds, protecting airway     ED Results / Procedures / Treatments   Labs (all labs ordered are listed, but only abnormal results are displayed) Labs Reviewed   CBC WITH DIFFERENTIAL/PLATELET - Abnormal; Notable for the following components:      Result Value   Platelets 138 (*)    Lymphs Abs 0.5 (*)    All other components within normal limits  COMPREHENSIVE METABOLIC PANEL - Abnormal; Notable for the following components:   Potassium 2.6 (*)    CO2 21 (*)    Glucose, Bld 113 (*)    Creatinine, Ser 1.65 (*)    Total Bilirubin 1.5 (*)    GFR calc non Af Amer 59 (*)    All other components within normal limits  SALICYLATE LEVEL - Abnormal; Notable for the following components:   Salicylate Lvl <7.0 (*)    All other components within normal limits  ACETAMINOPHEN LEVEL - Abnormal; Notable for the following components:   Acetaminophen (Tylenol), Serum <10 (*)    All other components within normal limits  URINALYSIS, ROUTINE W REFLEX MICROSCOPIC - Abnormal; Notable for the following components:   APPearance HAZY (*)    Ketones, ur 20 (*)    Protein, ur 30 (*)    All other components within normal limits  MAGNESIUM - Abnormal; Notable for the following components:   Magnesium 1.6 (*)    All other components within normal limits  CK - Abnormal; Notable for the following components:   Total CK 586 (*)    All other components within normal limits  CBG MONITORING, ED - Abnormal; Notable for the following components:   Glucose-Capillary 162 (*)    All other components within normal limits  RESPIRATORY PANEL BY RT PCR (FLU A&B, COVID)  ETHANOL  RAPID URINE DRUG SCREEN, HOSP PERFORMED  BASIC METABOLIC PANEL  GC/CHLAMYDIA PROBE AMP ([place]) NOT AT [facility]    EKG EKG Interpretation  Date/Time:  Friday March 25 2020 16:46:52 EDT Ventricular Rate:  104 PR Interval:    QRS Duration: 96 QT Interval:  295 QTC Calculation: 388 R Axis:   103 Text Interpretation: Sinus tachycardia Biatrial enlargement Consider right ventricular hypertrophy Confirmed by Marianna Fuss ([id number]) on 03/25/2020 4:59:55 PM   Radiology CT Head Wo  Contrast  Result Date: 03/25/2020 CLINICAL DATA:  In encephalopathy, altered mental status EXAM: CT HEAD WITHOUT CONTRAST TECHNIQUE: Contiguous axial images were obtained from the base of the skull through the vertex without intravenous contrast. COMPARISON:  CT head 10/05/2003 (report only, no images available) FINDINGS: Brain: No evidence of acute infarction, hemorrhage, hydrocephalus, extra-axial collection or mass lesion/mass effect. Vascular: No hyperdense vessel or unexpected calcification. Skull: No calvarial fracture or suspicious osseous lesion. No scalp swelling or hematoma. Sinuses/Orbits: Minimal mural thickening in the ethmoids and left maxillary sinus. Remaining paranasal sinuses and mastoid air cells are predominantly clear. Extensive pneumatization bilateral petrous apices as well as some hyperpneumatization of the  left mastoid. No visible mastoid effusion. Middle ear cavities are clear. Included orbital structures are unremarkable. Other: None IMPRESSION: 1. No acute intracranial findings. Electronically Signed   By: Kreg Shropshire M.D.   On: 03/25/2020 18:08    Procedures .Critical Care Performed by: Milagros Loll, MD Authorized by: Milagros Loll, MD   Critical care provider statement:    Critical care time (minutes):  45   Critical care was necessary to treat or prevent imminent or life-threatening deterioration of the following conditions:  CNS failure or compromise   Critical care was time spent personally by me on the following activities:  Discussions with consultants, evaluation of patient's response to treatment, examination of patient, ordering and performing treatments and interventions, ordering and review of laboratory studies, ordering and review of radiographic studies, pulse oximetry, re-evaluation of patient's condition, obtaining history from patient or surrogate and review of old charts   (including critical care time)  Medications Ordered in  ED Medications  potassium chloride 10 mEq in 100 mL IVPB (10 mEq Intravenous New Bag/Given 03/25/20 2306)  LORazepam (ATIVAN) injection 1 mg (1 mg Intravenous Given 03/25/20 1715)  haloperidol lactate (HALDOL) injection 5 mg (5 mg Intravenous Given 03/25/20 1710)  sodium chloride 0.9 % bolus 1,000 mL (0 mLs Intravenous Stopped 03/25/20 2304)  magnesium sulfate IVPB 2 g 50 mL (0 g Intravenous Stopped 03/25/20 2047)  potassium chloride SA (KLOR-CON) CR tablet 40 mEq (40 mEq Oral Given 03/25/20 2222)  ondansetron (ZOFRAN) injection 4 mg (4 mg Intravenous Given 03/25/20 2329)    ED Course  I have reviewed the triage vital signs and the nursing notes.  Pertinent labs & imaging results that were available during my care of the patient were reviewed by me and considered in my medical decision making (see chart for details).  Clinical Course as of May 01 0001  Fri Mar 25, 2020  1745 Rechecked, resting comfortably   [RD]  1752 Obtained additional history from mother   [RD]  [phone] - talked with mother, obtained addiitonal history   [RD]  1924 Rechecked, continues to rest comfortably in bed, still somewhat groggy   [RD]  2029 More alert, denies any acute complaints, will ask TTS to consult   [RD]    Clinical Course User Index [RD] Milagros Loll, MD   MDM Rules/Calculators/A&P                      19 year old male who presented to ER after family called with concern for agitation.  EMS reported significant agitation, combativeness.  Given 400 mg IM ketamine prior to arrival.  On arrival here patient status consistent with large dose of ketamine, he was breathing pleasantly and had stable vital signs and was protecting airway though he had decreased responsiveness.  Slowly patient woke up, provided Haldol and Ativan.  Eventually patient had complete return of consciousness, seem to be acting appropriately.  TTS was consulted given the reported behavior by family.  Lab work was  performed which was ordered for dehydration, borderline AKI, hypomagnesemia, hypokalemia.  CK borderline elevated.  Provided with fluids, magnesium, potassium replacement.   While awaiting TTS eval, repeat BMP, patient signed to Dr. Preston Fleeting.  Disposition pending repeat BMP and TTS eval.   Final Clinical Impression(s) / ED Diagnoses Final diagnoses:  Aggressive behavior    Rx / DC Orders ED Discharge Orders    None       Milagros Loll, MD 03/26/20 0001

## 2020-03-25 NOTE — ED Triage Notes (Signed)
Patient arrived via EMS; s/p administration of ketamine 400 mg Im approx 1621 from home. some aggressive behavior reported by family of unknown cause; endorsed no know drug abuse / use except occasional marijuana. Patient arrived in unit with VSS; supplemental o2 via [place];

## 2020-03-26 ENCOUNTER — Other Ambulatory Visit: Payer: Self-pay

## 2020-03-26 LAB — BASIC METABOLIC PANEL
Anion gap: 10 (ref 5–15)
BUN: 12 mg/dL (ref 6–20)
CO2: 21 mmol/L — ABNORMAL LOW (ref 22–32)
Calcium: 8.8 mg/dL — ABNORMAL LOW (ref 8.9–10.3)
Chloride: 107 mmol/L (ref 98–111)
Creatinine, Ser: 1.2 mg/dL (ref 0.61–1.24)
GFR calc Af Amer: >60 mL/min (ref >60)
GFR calc non Af Amer: >60 mL/min (ref >60)
Glucose, Bld: 83 mg/dL (ref 70–99)
Potassium: 4.1 mmol/L (ref 3.5–5.1)
Sodium: 138 mmol/L (ref 135–145)

## 2020-03-26 LAB — RAPID URINE DRUG SCREEN, HOSP PERFORMED
Amphetamines: NOT DETECTED
Barbiturates: NOT DETECTED
Benzodiazepines: NOT DETECTED
Cocaine: NOT DETECTED
Opiates: NOT DETECTED
Tetrahydrocannabinol: POSITIVE — AB

## 2020-03-26 NOTE — [facility] Counselor (Signed)
Disposition:   Nira Conn, NP, recommend overnight observation to be re-assessed in the a.m.

## 2020-03-26 NOTE — ED Provider Notes (Addendum)
BH team indicates they have reassessed and pt psychiatrically stable/clear for d/c:  Disposition: No evidence of imminent risk to self or others at present.   Supportive therapy provided about ongoing stressors. Refer to IOP. Discussed crisis plan, support from social network, calling 911, coming to the Emergency Department, and calling Suicide Hotline.  This service was provided via telemedicine using a 2-way, interactive audio and video technology.  Names of all persons participating in this telemedicine service and their role in this encounter. Name: Gabriel Nelson  Role: Patient   Name: Gabriel Nelson Role: Patient           Oneta Rack, NP 03/26/2020 11:26 AM   Pt alert, content, oriented. Normal mood and affect. No thoughts of harm to self or others. Pt indicates he feels ready for d/c.   Pt currently appears stable for d/c.   Return precautions provided.         Cathren Laine, MD 03/26/20 1148

## 2020-03-26 NOTE — ED Notes (Addendum)
Telepsych completed. Pt did not wear pants to ED - Belongings - 1 labeled belongings bag at bedside w/pt's socks and shirt - blue scrubs given to pt.

## 2020-03-26 NOTE — ED Notes (Addendum)
Resources discussed and given to pt along w/discharge instructions - questions answered to satisfaction - Pt verified he has his shirt and socks. Pt called his family member for ride from ED - Pt escorted to ED lobby - states he wants to wait outside. Pt declined to eat lunch.

## 2020-03-26 NOTE — Consult Note (Addendum)
Telepsych Consultation   Reason for Consult: Aggressive behavior Referring Physician:  EDP Location of Patient: 81[facility] Location of Provider: [facility]  Patient Identification: Gabriel Nelson MRN:  [id number] Principal Diagnosis: <principal problem not specified> Diagnosis:  Active Problems:   * No active hospital problems. *   Total Time spent with patient: 15 minutes  Subjective:   Gabriel Nelson is a 19 y.o. male was seen and evaluated via teleassessment.  He is awake alert and oriented x3.  Reports " I just blacked out" unable to recall the reason for his admission.  He denied suicidal or homicidal ideations.  Denies auditory or visual hallucinations.  Reported previous inpatient admissions roughly 3 years prior denied that he has been followed by psychiatry or therapist since. Patient denied family history with mental illness.  Denies that he is prescribed daily medication for mood stabilization.  Patient does admit to mild depressive symptoms however has declined follow-up with therapy. "  I can handle it on my own. Patient reported he has spoken to his sister this morning.  patient provided verbal authorization to follow-up with patient's mother [name].  Mother denied any safety concerns with patient returning home.  Patient's mother is hopeful that patient will start using marijuana and follow-up with psychiatry.  CSW to provide additional follow-up for outpatient resources.  Case staffed with attending psychiatrist Lucianne Muss.  Support, encouragement and  reassurance was provided.  HPI: Per assessment note: -[name] is an 19 y.o. male present to [facility] after a reported Psychotic Break. Patient stated he does not remember what happened to put him in the hospital. Patient denied suicidal/homicidal ideations, denied auditory/visual hallucinations and denied feeling paranoid. Patient complained of being tired. Report report he does remember getting frustrated yesterday and  walking off his job. Patient dressed in a hospital gown present pleasant and calm. He tone and rate of speech within normal limits. Per chart review patient was inpatient at [facility] 2019. He has a history of aggressive behaviors towards others.   Past Psychiatric History  Risk to Self: Suicidal Ideation: No Suicidal Intent: No Is patient at risk for suicide?: No Suicidal Plan?: No Access to Means: No What has been your use of drugs/alcohol within the last 12 months?: THC / Alcohol  How many times?: 0(denied ) Other Self Harm Risks: none report  Triggers for Past Attempts: None known Intentional Self Injurious Behavior: None Risk to Others: Homicidal Ideation: No Thoughts of Harm to Others: No Current Homicidal Intent: No Current Homicidal Plan: No Access to Homicidal Means: No Identified Victim: n/a History of harm to others?: Yes(history of aggressive bx towards others ) Assessment of Violence: On admission(Hx of aggressive bx towards others ) Violent Behavior Description: Hx of aggressive bx towards others  Does patient have access to weapons?: No Criminal Charges Pending?: Yes Describe Pending Criminal Charges: disorderly conduct (several others could not remember) Does patient have a court date: Yes Court Date: (June 2021) Prior Inpatient Therapy: Prior Inpatient Therapy: Yes Prior Therapy Dates: 07/2018 Prior Therapy Facilty/Provider(s): [facility] Reason for Treatment: mental health  Prior Outpatient Therapy: Prior Outpatient Therapy: No Does patient have an ACCT team?: No Does patient have Intensive In-House Services?  : No Does patient have [facility] services? : No Does patient have P4CC services?: No  Past Medical History:  Past Medical History:  Diagnosis Date  . ADHD   . Medical history non-contributory    No past surgical history on file. Family History:  Family History  Problem Relation  Age of Onset  . Healthy Mother    Family Psychiatric  History:  Social History:   Social History   Substance and Sexual Activity  Alcohol Use Yes   Comment: He states occassionally, reports last beer was when his brother died.      Social History   Substance and Sexual Activity  Drug Use Yes  . Types: Marijuana, Barbituates   Comment: Daily. Last used: yesterday     Social History   Socioeconomic History  . Marital status: Single    Spouse name: Not on file  . Number of children: Not on file  . Years of education: Not on file  . Highest education level: Not on file  Occupational History  . Not on file  Tobacco Use  . Smoking status: Current Every Day Smoker    Packs/day: 0.25    Years: 4.00    Pack years: 1.00    Types: Cigarettes, Cigars  . Smokeless tobacco: Never Used  Substance and Sexual Activity  . Alcohol use: Yes    Comment: He states occassionally, reports last beer was when his brother died.   . Drug use: Yes    Types: Marijuana, Barbituates    Comment: Daily. Last used: yesterday   . Sexual activity: Not on file    Comment: uta, patient not fully oriented  Other Topics Concern  . Not on file  Social History Narrative  . Not on file   Social Determinants of Health   Financial Resource Strain:   . Difficulty of Paying Living Expenses:   Food Insecurity:   . Worried About Programme researcher, broadcasting/film/videounning Out of Food in the Last Year:   . Baristaan Out of Food in the Last Year:   Transportation Needs:   . Freight forwarderLack of Transportation (Medical):   Marland Kitchen. Lack of Transportation (Non-Medical):   Physical Activity:   . Days of Exercise per Week:   . Minutes of Exercise per Session:   Stress:   . Feeling of Stress :   Social Connections:   . Frequency of Communication with Friends and Family:   . Frequency of Social Gatherings with Friends and Family:   . Attends Religious Services:   . Active Member of Clubs or Organizations:   . Attends BankerClub or Organization Meetings:   Marland Kitchen. Marital Status:    Additional Social History:    Allergies:  No Known Allergies  Labs:   Results for orders placed or performed during the hospital encounter of 03/25/20 (from the past 48 hour(s))  CBG monitoring, ED     Status: Abnormal   Collection Time: 03/25/20  4:52 PM  Result Value Ref Range   Glucose-Capillary 162 (H) 70 - 99 mg/dL    Comment: Glucose reference range applies only to samples taken after fasting for at least 8 hours.  CBC with Differential     Status: Abnormal   Collection Time: 03/25/20  4:56 PM  Result Value Ref Range   WBC 7.6 4.0 - 10.5 K/uL   RBC 4.98 4.22 - 5.81 MIL/uL   Hemoglobin 14.4 13.0 - 17.0 g/dL   HCT 29.544.8 28.439.0 - 13.252.0 %   MCV 90.0 80.0 - 100.0 fL   MCH 28.9 26.0 - 34.0 pg   MCHC 32.1 30.0 - 36.0 g/dL   RDW 44.012.3 10.211.5 - 72.515.5 %   Platelets 138 (L) 150 - 400 K/uL   nRBC 0.0 0.0 - 0.2 %   Neutrophils Relative % 83 %   Neutro Abs 6.4 1.7 -  7.7 K/uL   Lymphocytes Relative 7 %   Lymphs Abs 0.5 (L) 0.7 - 4.0 K/uL   Monocytes Relative 9 %   Monocytes Absolute 0.7 0.1 - 1.0 K/uL   Eosinophils Relative 0 %   Eosinophils Absolute 0.0 0.0 - 0.5 K/uL   Basophils Relative 1 %   Basophils Absolute 0.0 0.0 - 0.1 K/uL   Immature Granulocytes 0 %   Abs Immature Granulocytes 0.03 0.00 - 0.07 K/uL    Comment: Performed at [facility], [address] [place], [place] [address]  Comprehensive metabolic panel     Status: Abnormal   Collection Time: 03/25/20  4:56 PM  Result Value Ref Range   Sodium 138 135 - 145 mmol/L   Potassium 2.6 (LL) 3.5 - 5.1 mmol/L    Comment: CRITICAL RESULT CALLED TO, READ BACK BY AND VERIFIED WITH: L.CHILTON RN 1809 03/25/20 MCCORMICK K    Chloride 102 98 - 111 mmol/L   CO2 21 (L) 22 - 32 mmol/L   Glucose, Bld 113 (H) 70 - 99 mg/dL    Comment: Glucose reference range applies only to samples taken after fasting for at least 8 hours.   BUN 15 6 - 20 mg/dL   Creatinine, Ser 6.04 (H) 0.61 - 1.24 mg/dL   Calcium 9.3 8.9 - 54.0 mg/dL   Total Protein 7.6 6.5 - 8.1 g/dL   Albumin 4.5 3.5 - 5.0 g/dL   AST 35 15 - 41  U/L   ALT 26 0 - 44 U/L   Alkaline Phosphatase 50 38 - 126 U/L   Total Bilirubin 1.5 (H) 0.3 - 1.2 mg/dL   GFR calc non Af Amer 59 (L) >60 mL/min   GFR calc Af Amer >60 >60 mL/min   Anion gap 15 5 - 15    Comment: Performed at [facility], [address], [place], [place] [address]  Salicylate level     Status: Abnormal   Collection Time: 03/25/20  4:56 PM  Result Value Ref Range   Salicylate Lvl <7.0 (L) 7.0 - 30.0 mg/dL    Comment: Performed at [facility], [address] [place], [place] [address]  Acetaminophen level     Status: Abnormal   Collection Time: 03/25/20  4:56 PM  Result Value Ref Range   Acetaminophen (Tylenol), Serum <10 (L) 10 - 30 ug/mL    Comment: (NOTE) Therapeutic concentrations vary significantly. A range of 10-30 ug/mL  may be an effective concentration for many patients. However, some  are best treated at concentrations outside of this range. Acetaminophen concentrations >150 ug/mL at 4 hours after ingestion  and >50 ug/mL at 12 hours after ingestion are often associated with  toxic reactions. Performed at [facility], [address], [place], [place] [address]   Ethanol     Status: None   Collection Time: 03/25/20  4:56 PM  Result Value Ref Range   Alcohol, Ethyl (B) <10 <10 mg/dL    Comment: (NOTE) Lowest detectable limit for serum alcohol is 10 mg/dL. For medical purposes only. Performed at [facility], [address] [place], [place] [address]   Magnesium     Status: Abnormal   Collection Time: 03/25/20  6:20 PM  Result Value Ref Range   Magnesium 1.6 (L) 1.7 - 2.4 mg/dL    Comment: Performed at [facility], [address], [place], [place] [address]  CK     Status: Abnormal  Collection Time: 03/25/20  7:00 PM  Result Value Ref Range   Total CK 586 (H) 49 - 397 U/L    Comment: Performed at [facility], [address], [place], [place] [address]  Respiratory Panel by RT PCR (Flu A&B, Covid) -  Nasopharyngeal Swab     Status: None   Collection Time: 03/25/20  7:00 PM   Specimen: Nasopharyngeal Swab  Result Value Ref Range   SARS Coronavirus 2 by RT PCR NEGATIVE NEGATIVE    Comment: (NOTE) SARS-CoV-2 target nucleic acids are NOT DETECTED. The SARS-CoV-2 RNA is generally detectable in upper respiratoy specimens during the acute phase of infection. The lowest concentration of SARS-CoV-2 viral copies this assay can detect is 131 copies/mL. A negative result does not preclude SARS-Cov-2 infection and should not be used as the sole basis for treatment or other patient management decisions. A negative result may occur with  improper specimen collection/handling, submission of specimen other than nasopharyngeal swab, presence of viral mutation(s) within the areas targeted by this assay, and inadequate number of viral copies (<131 copies/mL). A negative result must be combined with clinical observations, patient history, and epidemiological information. The expected result is Negative. Fact Sheet for Patients:  https://www.moore.com/ Fact Sheet for Healthcare Providers:  https://www.young.biz/ This test is not yet ap proved or cleared by the [place] FDA and  has been authorized for detection and/or diagnosis of SARS-CoV-2 by FDA under an Emergency Use Authorization (EUA). This EUA will remain  in effect (meaning this test can be used) for the duration of the COVID-19 declaration under Section 564(b)(1) of the Act, 21 U.S.C. section 360bbb-3(b)(1), unless the authorization is terminated or revoked sooner.    Influenza A by PCR NEGATIVE NEGATIVE   Influenza B by PCR NEGATIVE NEGATIVE    Comment: (NOTE) The Xpert Xpress SARS-CoV-2/FLU/RSV assay is intended as an aid in  the diagnosis of influenza from Nasopharyngeal swab specimens and  should not be used as a sole basis for treatment. Nasal washings and  aspirates are unacceptable for  Xpert Xpress SARS-CoV-2/FLU/RSV  testing. Fact Sheet for Patients: https://www.moore.com/ Fact Sheet for Healthcare Providers: https://www.young.biz/ This test is not yet approved or cleared by the [place] FDA and  has been authorized for detection and/or diagnosis of SARS-CoV-2 by  FDA under an Emergency Use Authorization (EUA). This EUA will remain  in effect (meaning this test can be used) for the duration of the  Covid-19 declaration under Section 564(b)(1) of the Act, 21  U.S.C. section 360bbb-3(b)(1), unless the authorization is  terminated or revoked. Performed at [facility], [address], [place], [place] [address]   Rapid urine drug screen (hospital performed)     Status: Abnormal   Collection Time: 03/25/20 11:09 PM  Result Value Ref Range   Opiates NONE DETECTED NONE DETECTED   Cocaine NONE DETECTED NONE DETECTED   Benzodiazepines NONE DETECTED NONE DETECTED   Amphetamines NONE DETECTED NONE DETECTED   Tetrahydrocannabinol POSITIVE (A) NONE DETECTED   Barbiturates NONE DETECTED NONE DETECTED    Comment: (NOTE) DRUG SCREEN FOR MEDICAL PURPOSES ONLY.  IF CONFIRMATION IS NEEDED FOR ANY PURPOSE, NOTIFY LAB WITHIN 5 DAYS. LOWEST DETECTABLE LIMITS FOR URINE DRUG SCREEN Drug Class                     Cutoff (ng/mL) Amphetamine and metabolites    1000 Barbiturate and metabolites    200 Benzodiazepine  035 Tricyclics and metabolites     300 Opiates and metabolites        300 Cocaine and metabolites        300 THC                            50 Performed at [facility], [place] [address], [place], [place] [address]   Urinalysis, Routine w reflex microscopic     Status: Abnormal   Collection Time: 03/25/20 11:09 PM  Result Value Ref Range   Color, Urine YELLOW YELLOW   APPearance HAZY (A) CLEAR   Specific Gravity, Urine 1.017 1.005 - 1.030   pH 6.0 5.0 - 8.0   Glucose, UA NEGATIVE NEGATIVE mg/dL    Hgb urine dipstick NEGATIVE NEGATIVE   Bilirubin Urine NEGATIVE NEGATIVE   Ketones, ur 20 (A) NEGATIVE mg/dL   Protein, ur 30 (A) NEGATIVE mg/dL   Nitrite NEGATIVE NEGATIVE   Leukocytes,Ua NEGATIVE NEGATIVE   RBC / HPF 0-5 0 - 5 RBC/hpf   WBC, UA 0-5 0 - 5 WBC/hpf   Bacteria, UA NONE SEEN NONE SEEN   Mucus PRESENT    Hyaline Casts, UA PRESENT     Comment: Performed at [facility] [address] [place], [place] [address]  Basic metabolic panel     Status: Abnormal   Collection Time: 03/25/20 11:21 PM  Result Value Ref Range   Sodium 138 135 - 145 mmol/L   Potassium 4.1 3.5 - 5.1 mmol/L   Chloride 107 98 - 111 mmol/L   CO2 21 (L) 22 - 32 mmol/L   Glucose, Bld 83 70 - 99 mg/dL    Comment: Glucose reference range applies only to samples taken after fasting for at least 8 hours.   BUN 12 6 - 20 mg/dL   Creatinine, Ser 1.20 0.61 - 1.24 mg/dL   Calcium 8.8 (L) 8.9 - 10.3 mg/dL   GFR calc non Af Amer >60 >60 mL/min   GFR calc Af Amer >60 >60 mL/min   Anion gap 10 5 - 15    Comment: Performed at [facility] [address], [place], [place] [address]    Medications:  No current facility-administered medications for this encounter.   Current Outpatient Medications  Medication Sig Dispense Refill  . ibuprofen (ADVIL) 200 MG tablet Take 400 mg by mouth every 6 (six) hours as needed for headache (pain).    . ARIPiprazole (ABILIFY) 10 MG tablet Take 1 tablet (10 mg total) by mouth daily. (Patient not taking: Reported on 03/25/2020) 30 tablet 0  . hydrOXYzine (ATARAX/VISTARIL) 25 MG tablet Take 1 tablet (25 mg total) by mouth 3 (three) times daily as needed for anxiety. (Patient not taking: Reported on 03/25/2020) 30 tablet 0  . traZODone (DESYREL) 50 MG tablet Take 1 tablet (50 mg total) by mouth at bedtime as needed for sleep. (Patient not taking: Reported on 03/25/2020) 30 tablet 0    Musculoskeletal:   Psychiatric Specialty Exam: Physical Exam  Vitals  reviewed. Constitutional: He appears well-developed.  Skin: Skin is warm and dry.    Review of Systems  Psychiatric/Behavioral: The patient is nervous/anxious.   All other systems reviewed and are negative.   Blood pressure 140/82, pulse 70, temperature 100.1 F (37.8 C), temperature source Oral, resp. rate 19, SpO2 99 %.There is no height or weight on file to calculate BMI.  General Appearance: Casual   Eye Contact:  Fair  Speech:  Clear and Coherent  Volume:  Normal  Mood:  Anxious and Depressed  Affect:  Congruent  Thought Process:  Coherent  Orientation:  Full (Time, Place, and Person)  Thought Content:  Logical  Suicidal Thoughts:  No  Homicidal Thoughts:  No  Memory:  Immediate;   Fair Recent;   Fair  Judgement:  Fair  Insight:  Fair  Psychomotor Activity:  Normal  Concentration:  Concentration: Fair  Recall:  [employer] of Knowledge:  Fair  Language:  Fair  Akathisia:  No  Handed:  Right  AIMS (if indicated):     Assets:  Communication Skills Desire for Improvement Resilience Social Support  ADL's:  Intact  Cognition:  WNL  Sleep:     NP spoke to EDP Stenil regarding discharge disposition CSW to provided additional outpatient resources.     Disposition: No evidence of imminent risk to self or others at present.   Supportive therapy provided about ongoing stressors. Refer to IOP. Discussed crisis plan, support from social network, calling 911, coming to the Emergency Department, and calling Suicide Hotline.  This service was provided via telemedicine using a 2-way, interactive audio and video technology.  Names of all persons participating in this telemedicine service and their role in this encounter. Name: Kyriakos Babler  Role: Patient   Name: T.Danial Sisley Role: Patient           Oneta Rack, NP 03/26/2020 11:26 AM

## 2020-03-26 NOTE — ED Notes (Signed)
Denies SI/HI - States he wants to go home. Pt noted to be calm, cooperative.

## 2020-03-26 NOTE — ED Notes (Signed)
Pt talking w/sister on phone at nurses' desk.

## 2020-03-26 NOTE — Discharge Instructions (Addendum)
It was our pleasure to provide your ER care today - we hope that you feel better.  Follow up with IOP and/or other resources provided by behavioral health.  Also follow up with primary care provider in the next few weeks.  For mental health issues and/or crisis, you may also go directly to [facility] or [facility].  Return to ER if worse, new symptoms, fevers, trouble breathing, new or severe pain, or other concern.

## 2020-03-31 ENCOUNTER — Other Ambulatory Visit: Payer: Self-pay

## 2020-03-31 ENCOUNTER — Emergency Department (HOSPITAL_COMMUNITY)
Admission: EM | Admit: 2020-03-31 | Discharge: 2020-04-01 | Disposition: A | Discharge status: Home / Self Care | Payer: Medicaid Other | Attending: Emergency Medicine | Admitting: Emergency Medicine

## 2020-03-31 DIAGNOSIS — Z20822 Contact with and (suspected) exposure to covid-19: Secondary | ICD-10-CM | POA: Diagnosis not present

## 2020-03-31 DIAGNOSIS — R4689 Other symptoms and signs involving appearance and behavior: Secondary | ICD-10-CM | POA: Diagnosis not present

## 2020-03-31 DIAGNOSIS — F909 Attention-deficit hyperactivity disorder, unspecified type: Secondary | ICD-10-CM | POA: Insufficient documentation

## 2020-03-31 DIAGNOSIS — F17210 Nicotine dependence, cigarettes, uncomplicated: Secondary | ICD-10-CM | POA: Insufficient documentation

## 2020-03-31 DIAGNOSIS — R462 Strange and inexplicable behavior: Secondary | ICD-10-CM | POA: Diagnosis present

## 2020-03-31 DIAGNOSIS — E876 Hypokalemia: Secondary | ICD-10-CM | POA: Insufficient documentation

## 2020-03-31 LAB — RAPID URINE DRUG SCREEN, HOSP PERFORMED
Amphetamines: NOT DETECTED
Barbiturates: NOT DETECTED
Benzodiazepines: NOT DETECTED
Cocaine: NOT DETECTED
Opiates: NOT DETECTED
Tetrahydrocannabinol: POSITIVE — AB

## 2020-03-31 LAB — CBC WITH DIFFERENTIAL/PLATELET
Abs Immature Granulocytes: 0.02 10*3/uL (ref 0.00–0.07)
Basophils Absolute: 0.1 10*3/uL (ref 0.0–0.1)
Basophils Relative: 1 %
Eosinophils Absolute: 0 10*3/uL (ref 0.0–0.5)
Eosinophils Relative: 0 %
HCT: 44.7 % (ref 39.0–52.0)
Hemoglobin: 14.3 g/dL (ref 13.0–17.0)
Immature Granulocytes: 0 %
Lymphocytes Relative: 13 %
Lymphs Abs: 1 10*3/uL (ref 0.7–4.0)
MCH: 29 pg (ref 26.0–34.0)
MCHC: 32 g/dL (ref 30.0–36.0)
MCV: 90.7 fL (ref 80.0–100.0)
Monocytes Absolute: 0.8 10*3/uL (ref 0.1–1.0)
Monocytes Relative: 11 %
Neutro Abs: 5.8 10*3/uL (ref 1.7–7.7)
Neutrophils Relative %: 75 %
Platelets: 169 10*3/uL (ref 150–400)
RBC: 4.93 MIL/uL (ref 4.22–5.81)
RDW: 12 % (ref 11.5–15.5)
WBC: 7.7 10*3/uL (ref 4.0–10.5)
nRBC: 0 % (ref 0.0–0.2)

## 2020-03-31 LAB — COMPREHENSIVE METABOLIC PANEL
ALT: 39 U/L (ref 0–44)
AST: 38 U/L (ref 15–41)
Albumin: 4.6 g/dL (ref 3.5–5.0)
Alkaline Phosphatase: 48 U/L (ref 38–126)
Anion gap: 18 — ABNORMAL HIGH (ref 5–15)
BUN: 8 mg/dL (ref 6–20)
CO2: 23 mmol/L (ref 22–32)
Calcium: 9.2 mg/dL (ref 8.9–10.3)
Chloride: 97 mmol/L — ABNORMAL LOW (ref 98–111)
Creatinine, Ser: 1.6 mg/dL — ABNORMAL HIGH (ref 0.61–1.24)
GFR calc Af Amer: >60 mL/min (ref >60)
GFR calc non Af Amer: >60 mL/min (ref >60)
Glucose, Bld: 120 mg/dL — ABNORMAL HIGH (ref 70–99)
Potassium: 2.7 mmol/L — CL (ref 3.5–5.1)
Sodium: 138 mmol/L (ref 135–145)
Total Bilirubin: 1.4 mg/dL — ABNORMAL HIGH (ref 0.3–1.2)
Total Protein: 7.4 g/dL (ref 6.5–8.1)

## 2020-03-31 LAB — LACTIC ACID, PLASMA
Lactic Acid, Venous: 3 mmol/L (ref 0.5–1.9)
Lactic Acid, Venous: 2.1 mmol/L (ref 0.5–1.9)

## 2020-03-31 LAB — ETHANOL: Alcohol, Ethyl (B): <10 mg/dL (ref <10)

## 2020-03-31 LAB — I-STAT CHEM 8, ED
BUN: 5 mg/dL — ABNORMAL LOW (ref 6–20)
Calcium, Ion: 1.04 mmol/L — ABNORMAL LOW (ref 1.15–1.40)
Chloride: 105 mmol/L (ref 98–111)
Creatinine, Ser: 1 mg/dL (ref 0.61–1.24)
Glucose, Bld: 76 mg/dL (ref 70–99)
HCT: 41 % (ref 39.0–52.0)
Hemoglobin: 13.9 g/dL (ref 13.0–17.0)
Potassium: 3.4 mmol/L — ABNORMAL LOW (ref 3.5–5.1)
Sodium: 143 mmol/L (ref 135–145)
TCO2: 27 mmol/L (ref 22–32)

## 2020-03-31 LAB — ACETAMINOPHEN LEVEL: Acetaminophen (Tylenol), Serum: <10 ug/mL — ABNORMAL LOW (ref 10–30)

## 2020-03-31 LAB — MAGNESIUM: Magnesium: 1.8 mg/dL (ref 1.7–2.4)

## 2020-03-31 LAB — RESPIRATORY PANEL BY RT PCR (FLU A&B, COVID)
Influenza A by PCR: NEGATIVE
Influenza B by PCR: NEGATIVE
SARS Coronavirus 2 by RT PCR: NEGATIVE

## 2020-03-31 LAB — CBG MONITORING, ED: Glucose-Capillary: 112 mg/dL — ABNORMAL HIGH (ref 70–99)

## 2020-03-31 LAB — CK: Total CK: 809 U/L — ABNORMAL HIGH (ref 49–397)

## 2020-03-31 LAB — SALICYLATE LEVEL: Salicylate Lvl: <7 mg/dL — ABNORMAL LOW (ref 7.0–30.0)

## 2020-03-31 MED ORDER — RISPERIDONE 2 MG PO TBDP
2.0000 mg | ORAL_TABLET | Freq: Three times a day (TID) | ORAL | Status: DC | PRN
  Filled 2020-03-31: qty 1

## 2020-03-31 MED ORDER — ACETAMINOPHEN 325 MG PO TABS: 650.0000 mg | ORAL_TABLET | ORAL | Status: DC | PRN

## 2020-03-31 MED ORDER — POTASSIUM CHLORIDE CRYS ER 20 MEQ PO TBCR: 40.0000 meq | EXTENDED_RELEASE_TABLET | Freq: Once | ORAL | Status: DC

## 2020-03-31 MED ORDER — ZOLPIDEM TARTRATE 5 MG PO TABS: 5.0000 mg | ORAL_TABLET | Freq: Every evening | ORAL | Status: DC | PRN

## 2020-03-31 MED ORDER — SODIUM CHLORIDE 0.9 % IV BOLUS
1000.0000 mL | Freq: Once | INTRAVENOUS | Status: AC
  Administered 2020-03-31: 17:00:00 1000 mL via INTRAVENOUS

## 2020-03-31 MED ORDER — LORAZEPAM 1 MG PO TABS
1.0000 mg | ORAL_TABLET | ORAL | Status: AC | PRN
  Administered 2020-04-01: 08:00:00 1 mg via ORAL
  Filled 2020-03-31: qty 1

## 2020-03-31 MED ORDER — ALUM & MAG HYDROXIDE-SIMETH 200-200-20 MG/5ML PO SUSP: 30.0000 mL | Freq: Four times a day (QID) | ORAL | Status: DC | PRN

## 2020-03-31 MED ORDER — ZIPRASIDONE MESYLATE 20 MG IM SOLR
20.0000 mg | INTRAMUSCULAR | Status: AC | PRN
  Administered 2020-04-01: 20 mg via INTRAMUSCULAR
  Filled 2020-03-31: qty 20

## 2020-03-31 MED ORDER — ONDANSETRON HCL 4 MG PO TABS: 4.0000 mg | ORAL_TABLET | Freq: Three times a day (TID) | ORAL | Status: DC | PRN

## 2020-03-31 MED ORDER — POTASSIUM CHLORIDE 10 MEQ/100ML IV SOLN
10.0000 meq | INTRAVENOUS | Status: AC
  Administered 2020-03-31 (×2): 10 meq via INTRAVENOUS
  Filled 2020-03-31 (×2): qty 100

## 2020-03-31 MED ORDER — NICOTINE 21 MG/24HR TD PT24: 21.0000 mg | MEDICATED_PATCH | Freq: Every day | TRANSDERMAL | Status: DC

## 2020-03-31 MED ORDER — SODIUM CHLORIDE 0.9 % IV BOLUS
1000.0000 mL | Freq: Once | INTRAVENOUS | Status: AC
  Administered 2020-03-31: 1000 mL via INTRAVENOUS

## 2020-03-31 MED ORDER — LORAZEPAM 2 MG/ML IJ SOLN
INTRAMUSCULAR | Status: AC
  Filled 2020-03-31: qty 1

## 2020-03-31 NOTE — ED Provider Notes (Signed)
[facility] EMERGENCY DEPARTMENT Provider Note   CSN: [id number] Arrival date & time: 03/31/20  1415     History Chief Complaint  Patient presents with  . Altered Mental Status    Gabriel Nelson is a 19 y.o. male.  The history is provided by the EMS personnel and medical records. No language interpreter was used.  Altered Mental Status    20 year old male with history of adjustment disorder, marijuana abuse, remote history of osteomyelitis brought here via EMS for evaluation of altered mental status.  Per EMS, patient was found wandering on the street, stripping out of physicals, acting erratic.  He was combative towards EMS.  He was found to be diaphoretic and tachycardic with heart rate in the 130s.  Patient received 40 mg of ketamine via EMS prior to arrival.  He arrived in four-point restraints.  Girlfriend mentioned that patient normally does not use drugs aside from marijuana.  History is limited due to altered mental status, level 5 caveats.  Past Medical History:  Diagnosis Date  . ADHD   . Medical history non-contributory     Patient Active Problem List   Diagnosis Date Noted  . Moderate cannabis use disorder (HCC) 08/08/2018  . 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 Rhabdomyolysis 10/27/2016  . Marijuana intoxication (HCC) 10/27/2016  . Osteomyelitis (HCC) 10/23/2015    No past surgical history on file.     Family History  Problem Relation Age of Onset  . Healthy Mother     Social History   Tobacco Use  . Smoking status: Current Every Day Smoker    Packs/day: 0.25    Years: 4.00    Pack years: 1.00    Types: Cigarettes, Cigars  . Smokeless tobacco: Never Used  Substance Use Topics  . Alcohol use: Yes    Comment: He states occassionally, reports last beer was when his brother died.   . Drug use: Yes    Types: Marijuana, Barbituates    Comment: Daily. Last used: yesterday     Home  Medications Prior to Admission medications   Medication Sig Start Date End Date Taking? Authorizing Provider  ARIPiprazole (ABILIFY) 10 MG tablet Take 1 tablet (10 mg total) by mouth daily. Patient not taking: Reported on 03/25/2020 08/12/18   Oneta Rack, NP  hydrOXYzine (ATARAX/VISTARIL) 25 MG tablet Take 1 tablet (25 mg total) by mouth 3 (three) times daily as needed for anxiety. Patient not taking: Reported on 03/25/2020 08/11/18   Oneta Rack, NP  ibuprofen (ADVIL) 200 MG tablet Take 400 mg by mouth every 6 (six) hours as needed for headache (pain).    [provider]  traZODone (DESYREL) 50 MG tablet Take 1 tablet (50 mg total) by mouth at bedtime as needed for sleep. Patient not taking: Reported on 03/25/2020 08/11/18   Oneta Rack, NP    Allergies    Patient has no known allergies.  Review of Systems   Review of Systems  Unable to perform ROS: Mental status change    Physical Exam Updated Vital Signs BP (!) 174/118 (BP Location: Right Arm)   Pulse 69   Temp 98.3 F (36.8 C)   Resp 18   Ht 5\' 10"  (1.778 m)   Wt 85.3 kg   SpO2 100%   BMI 26.98 kg/m   Physical Exam Vitals and nursing note reviewed.  Constitutional:      Comments: Patient laying in bed, four-point restraints in place,  appears altered, not responding to verbal commands.  HENT:     Head: Normocephalic and atraumatic.  Eyes:     Comments: Pupils are 2 mm bilaterally and reactive.  Conjunctiva are injected.  Cardiovascular:     Rate and Rhythm: Tachycardia present.     Pulses: Normal pulses.     Heart sounds: Normal heart sounds.  Pulmonary:     Effort: Pulmonary effort is normal.     Breath sounds: Normal breath sounds.  Abdominal:     General: There is no distension.     Palpations: Abdomen is soft.  Musculoskeletal:     Cervical back: Normal range of motion and neck supple.     Comments: Moving all 4 extremities while in bed does not follow command.  Neurological:     Mental  Status: He is disoriented.     GCS: GCS eye subscore is 3. GCS verbal subscore is 2. GCS motor subscore is 5.     ED Results / Procedures / Treatments   Labs (all labs ordered are listed, but only abnormal results are displayed) Labs Reviewed  ACETAMINOPHEN LEVEL - Abnormal; Notable for the following components:      Result Value   Acetaminophen (Tylenol), Serum <10 (*)    All other components within normal limits  COMPREHENSIVE METABOLIC PANEL - Abnormal; Notable for the following components:   Potassium 2.7 (*)    Chloride 97 (*)    Glucose, Bld 120 (*)    Creatinine, Ser 1.60 (*)    Total Bilirubin 1.4 (*)    Anion gap 18 (*)    All other components within normal limits  LACTIC ACID, PLASMA - Abnormal; Notable for the following components:   Lactic Acid, Venous 3.0 (*)    All other components within normal limits  LACTIC ACID, PLASMA - Abnormal; Notable for the following components:   Lactic Acid, Venous 2.1 (*)    All other components within normal limits  SALICYLATE LEVEL - Abnormal; Notable for the following components:   Salicylate Lvl <7.0 (*)    All other components within normal limits  CK - Abnormal; Notable for the following components:   Total CK 809 (*)    All other components within normal limits  RAPID URINE DRUG SCREEN, HOSP PERFORMED - Abnormal; Notable for the following components:   Tetrahydrocannabinol POSITIVE (*)    All other components within normal limits  CBG MONITORING, ED - Abnormal; Notable for the following components:   Glucose-Capillary 112 (*)    All other components within normal limits  I-STAT CHEM 8, ED - Abnormal; Notable for the following components:   Potassium 3.4 (*)    BUN 5 (*)    Calcium, Ion 1.04 (*)    All other components within normal limits  RESPIRATORY PANEL BY RT PCR (FLU A&B, COVID)  CBC WITH DIFFERENTIAL/PLATELET  ETHANOL  MAGNESIUM    EKG None   Date: 03/31/2020  Rate: 92  Rhythm: normal sinus rhythm  QRS  Axis: normal  Intervals: normal  ST/T Wave abnormalities: normal  Conduction Disutrbances: none  Narrative Interpretation: borderling RAD and borderline T wave abnormalities  Old EKG Reviewed: No significant changes noted     Radiology No results found.  Procedures Procedures (including critical care time)  Medications Ordered in ED Medications  risperiDONE (RISPERDAL M-TABS) disintegrating tablet 2 mg (has no administration in time range)    And  LORazepam (ATIVAN) tablet 1 mg (has no administration in time range)  And  ziprasidone (GEODON) injection 20 mg (has no administration in time range)  acetaminophen (TYLENOL) tablet 650 mg (has no administration in time range)  zolpidem (AMBIEN) tablet 5 mg (has no administration in time range)  ondansetron (ZOFRAN) tablet 4 mg (has no administration in time range)  alum & mag hydroxide-simeth (MAALOX/MYLANTA) 200-200-20 MG/5ML suspension 30 mL (has no administration in time range)  nicotine (NICODERM CQ - dosed in mg/24 hours) patch 21 mg (0 mg Transdermal Hold 03/31/20 1540)  potassium chloride SA (KLOR-CON) CR tablet 40 mEq (40 mEq Oral Refused 03/31/20 1723)  sodium chloride 0.9 % bolus 1,000 mL (0 mLs Intravenous Stopped 03/31/20 1820)  LORazepam (ATIVAN) 2 MG/ML injection (  Given 03/31/20 1531)  potassium chloride 10 mEq in 100 mL IVPB (0 mEq Intravenous Stopped 03/31/20 1806)  sodium chloride 0.9 % bolus 1,000 mL (0 mLs Intravenous Stopped 03/31/20 1806)    ED Course  I have reviewed the triage vital signs and the nursing notes.  Pertinent labs & imaging results that were available during my care of the patient were reviewed by me and considered in my medical decision making (see chart for details).    MDM Rules/Calculators/A&P                      BP (!) 174/118 (BP Location: Right Arm)   Pulse 69   Temp 98.3 F (36.8 C)   Resp 18   Ht 5\' 10"  (1.778 m)   Wt 85.3 kg   SpO2 100%   BMI 26.98 kg/m   Final Clinical  Impression(s) / ED Diagnoses Final diagnoses:  Aggressive behavior  Hypokalemia    Rx / DC Orders ED Discharge Orders    None     2:48 PM Patient brought here with erratic behaviors received lot dose of IM ketamine by EMS for chemical restraints and was physically restrained with soft restraint.  At this time, I was unable to fully evaluate patient as he is very groggy consistence with recent ketamine treatment.  It appears patient was seen approximately a week ago for similar complaint as well.  Work-up initiated, IV fluid given.  Due to potential sympathomimetic reaction as patient was found to be diaphoretic hypertensive and tachycardic, will give IV fluids as well as benzodiazepine, monitor closely.  3:21 PM Nurse notified pt was having seizure-like movement.  Ativan ordered, on recheck he is currently not tremulous, but still not verbally responsive.  Making excessive tears.   4:33 PM At this time patient became more alert.  On reassessment, he denies any active pain.  He is unable to tell me what has transpired.  When asked if he has any thoughts of harm to himself or to anyone else, patient did not want to engage.  He is protecting his airway, mentating more appropriately.  Will consult TTS for further evaluation.  UDS positive for tetrahydrocannabinol.  COVID-19 test is negative.  Labs remarkable for hypokalemia with potassium of 2.7, will give supplementation.  Elevated lactic acid of 3.0, as well as elevated total CK of 809 likely to mild rhabdomyolysis.  Will check magnesium level and will also give IV fluid.  8:09 PM Normal magnesium level, patient received replenishment of his potassium and on repeat, potassium is currently 3.4.  At this time he is medically cleared.  He received IV fluid for mild rhabdo.  He has improved mentation. Pt was seen in the ER for essentially the same presentation last week.  TTS and  psych will evaluate pt and determine disposition.    Fayrene Helper,  PA-C 03/31/20 2015    Benjiman Core, MD 04/01/20 [phone]

## 2020-03-31 NOTE — ED Triage Notes (Signed)
Pt arrives [facility] with complaints of wondering the streets, striping down and acting erratic. Pt combative with EMS. Pt was diaphoretic with EMS and tachycardic in the 130s. 400mg  of Ketamine administered by EMS. Pt arrived in 4 point restraints.

## 2020-03-31 NOTE — ED Notes (Signed)
Per day shift pt was on 4 point restrain during day shift. Pt out of restrain on change of shift report, pt looks restless, bu follow commands.

## 2020-03-31 NOTE — ED Notes (Signed)
Sitter now present; [employer] relieved from department

## 2020-04-01 NOTE — ED Notes (Signed)
EDP made aware of CK per Psych in bed meeting. Instructed to ask pt to drink ice water and urinate. Pt is psych cleared at this time per bed meeting.

## 2020-04-01 NOTE — Discharge Instructions (Signed)
Please stay well-hydrated and use the provided resources to arrange appropriate follow-up.  Return here for concerning changes in your condition.

## 2020-04-01 NOTE — ED Notes (Signed)
Sister called reporting that he was walking to store and "flipped out" talking about his brothers who have died in past; GF has his stuff.

## 2020-04-01 NOTE — BH Assessment (Signed)
TTS attempted to assess patient, patient has been medicated per patient's nurse Windy Kalata. TTS to follow-up when patient is alert.

## 2020-04-01 NOTE — BH Assessment (Signed)
[facility] Assessment Progress Note This writer assited Maisie Fus NP in initial evaluation with tele psych assessment to allow Maisie Fus to evaluate patient.  Patient denied any S/I, H/I or AVH at that time. Thomas NP recommended patient be discharged since he did not meet inpatient criteria at that time (See Epic note). As this Clinical research associate later went to obtain additional information to complete the TTS assessment patient was noted to have been discharged. TTS assessment was discontinued.
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
## 2020-04-01 NOTE — Progress Notes (Addendum)
Patient ID: Gabriel Nelson, male   DOB: December 27, 2000, 19 y.o.   MRN: [id number]   Psychiatric reassessment   HPI: 19 year old male with history of adjustment disorder, marijuana abuse, remote history of osteomyelitis brought here via EMS for evaluation of altered mental status.  Per EMS, patient was found wandering on the street, stripping out of physicals, acting erratic.  He was combative towards EMS.  He was found to be diaphoretic and tachycardic with heart rate in the 130s.  Patient received 40 mg of ketamine via EMS prior to arrival.  He arrived in four-point restraints.  Girlfriend mentioned that patient normally does not use drugs aside from marijuana.  History is limited due to altered mental status, level 5 caveats.  Psychiatric evaluation: Gabriel Nelson is a 19 year old male who presented to [facility] for concerns as noted above. During this evaluation, he is alert and oriented x4, calm and cooperative. Per chart review, on arrival, patient was combative with an altered mental status, Today, his thoughts are organized and there has been no reports of combativeness. He continued to be confused about how he got naked downtown but did state," I must've been hot. Oh I do remember telling them that I was hot." He admitted to smoking mariajuana but denied the substance abuse or use. Per chart review, patient evaluated on 03/25/2020 following aggressive behavior but not recalling much of the incident. It was also noted that patient was smoking mariajuana at that time and mother noted that  in 2019 she was told he had a chemical imbalance and didn't need to smoke marijuana. His insight is poor when it comes to his mariajuana use. He denied current suicidal or homicidal ideations. Denied auditory or visual hallucinations. Denied receiving current outpatient psychiatric services or taking psychiatric medications. Denied psychiatric concerns at this time.   Disposition: Patient denies SI, HI or psychosis. Per Dr. Lucianne Muss,  patient psychiatrically cleared. Substance abuse resources to be provided along with additional outpatient psychiatric resources.  Prior to psychiatric clearance, chart was reviewed and there was concerns  for rhabdmolysis as elevated CK noted. Dr. Lucianne Muss advised nurse doing bed meeting the need for. IV hydration and  Antipsychotics should not be given.   ED aware of disposition

## 2020-04-01 NOTE — ED Notes (Signed)
Breakfast ordered 

## 2020-04-01 NOTE — ED Notes (Signed)
Pt sedated with Geodon for agitation unable to complete TTS assessment at this time.
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
## 2020-04-01 NOTE — ED Notes (Addendum)
Pt was very agitated, not speaking coherently, yelling and swearing at no one in particular. This EMT went to pts room, spoke with the patient. Pt requesting shower, wanted his clothes, and wanted to leave. This EMT spoke with the patient, informed him that he would need to stay until he was feeling better, shower in the morning, and that he did not have any clothes with him when he was brought in today but that we could try and find some before he leaves.

## 2020-04-02 ENCOUNTER — Emergency Department (HOSPITAL_COMMUNITY)
Admission: EM | Admit: 2020-04-02 | Discharge: 2020-04-02 | Discharge status: Absent without leave | Payer: Medicaid Other

## 2020-04-02 ENCOUNTER — Other Ambulatory Visit: Payer: Self-pay

## 2020-04-02 ENCOUNTER — Emergency Department (HOSPITAL_COMMUNITY)
Admission: EM | Admit: 2020-04-02 | Discharge: 2020-04-04 | Disposition: A | Discharge status: Other Acute Inpatient Hospital | Payer: Medicaid Other | Attending: Emergency Medicine | Admitting: Emergency Medicine

## 2020-04-02 DIAGNOSIS — F23 Brief psychotic disorder: Secondary | ICD-10-CM | POA: Insufficient documentation

## 2020-04-02 DIAGNOSIS — Z20822 Contact with and (suspected) exposure to covid-19: Secondary | ICD-10-CM | POA: Insufficient documentation

## 2020-04-02 DIAGNOSIS — F17210 Nicotine dependence, cigarettes, uncomplicated: Secondary | ICD-10-CM | POA: Insufficient documentation

## 2020-04-02 DIAGNOSIS — F29 Unspecified psychosis not due to a substance or known physiological condition: Secondary | ICD-10-CM

## 2020-04-02 LAB — COMPREHENSIVE METABOLIC PANEL
ALT: 47 U/L — ABNORMAL HIGH (ref 0–44)
AST: 88 U/L — ABNORMAL HIGH (ref 15–41)
Albumin: 4.6 g/dL (ref 3.5–5.0)
Alkaline Phosphatase: 46 U/L (ref 38–126)
Anion gap: 11 (ref 5–15)
BUN: 5 mg/dL — ABNORMAL LOW (ref 6–20)
CO2: 26 mmol/L (ref 22–32)
Calcium: 9.1 mg/dL (ref 8.9–10.3)
Chloride: 101 mmol/L (ref 98–111)
Creatinine, Ser: 1.19 mg/dL (ref 0.61–1.24)
GFR calc Af Amer: >60 mL/min (ref >60)
GFR calc non Af Amer: >60 mL/min (ref >60)
Glucose, Bld: 99 mg/dL (ref 70–99)
Potassium: 2.9 mmol/L — ABNORMAL LOW (ref 3.5–5.1)
Sodium: 138 mmol/L (ref 135–145)
Total Bilirubin: 1.9 mg/dL — ABNORMAL HIGH (ref 0.3–1.2)
Total Protein: 7.6 g/dL (ref 6.5–8.1)

## 2020-04-02 LAB — RAPID URINE DRUG SCREEN, HOSP PERFORMED
Amphetamines: NOT DETECTED
Barbiturates: NOT DETECTED
Benzodiazepines: NOT DETECTED
Cocaine: NOT DETECTED
Opiates: NOT DETECTED
Tetrahydrocannabinol: POSITIVE — AB

## 2020-04-02 LAB — CBC
HCT: 40.6 % (ref 39.0–52.0)
Hemoglobin: 13.7 g/dL (ref 13.0–17.0)
MCH: 29.9 pg (ref 26.0–34.0)
MCHC: 33.7 g/dL (ref 30.0–36.0)
MCV: 88.6 fL (ref 80.0–100.0)
Platelets: 185 10*3/uL (ref 150–400)
RBC: 4.58 MIL/uL (ref 4.22–5.81)
RDW: 12 % (ref 11.5–15.5)
WBC: 9.5 10*3/uL (ref 4.0–10.5)
nRBC: 0 % (ref 0.0–0.2)

## 2020-04-02 LAB — ACETAMINOPHEN LEVEL: Acetaminophen (Tylenol), Serum: <10 ug/mL — ABNORMAL LOW (ref 10–30)

## 2020-04-02 LAB — ETHANOL: Alcohol, Ethyl (B): 11 mg/dL — ABNORMAL HIGH (ref <10)

## 2020-04-02 LAB — SALICYLATE LEVEL: Salicylate Lvl: <7 mg/dL — ABNORMAL LOW (ref 7.0–30.0)

## 2020-04-02 MED ORDER — LORAZEPAM 2 MG/ML IJ SOLN
2.0000 mg | Freq: Once | INTRAMUSCULAR | Status: AC
  Administered 2020-04-02: 20:00:00 2 mg via INTRAMUSCULAR
  Filled 2020-04-02: qty 1

## 2020-04-02 MED ORDER — POTASSIUM CHLORIDE CRYS ER 20 MEQ PO TBCR
20.0000 meq | EXTENDED_RELEASE_TABLET | Freq: Every day | ORAL | Status: DC
  Administered 2020-04-03 – 2020-04-04 (×2): 20 meq via ORAL
  Filled 2020-04-02 (×2): qty 1

## 2020-04-02 MED ORDER — ZIPRASIDONE MESYLATE 20 MG IM SOLR
20.0000 mg | Freq: Once | INTRAMUSCULAR | Status: AC
  Administered 2020-04-02: 13:00:00 20 mg via INTRAMUSCULAR
  Filled 2020-04-02: qty 20

## 2020-04-02 MED ORDER — ZIPRASIDONE MESYLATE 20 MG IM SOLR
20.0000 mg | Freq: Once | INTRAMUSCULAR | Status: AC
  Administered 2020-04-02: 20:00:00 20 mg via INTRAMUSCULAR
  Filled 2020-04-02: qty 20

## 2020-04-02 MED ORDER — POTASSIUM CHLORIDE CRYS ER 20 MEQ PO TBCR
40.0000 meq | EXTENDED_RELEASE_TABLET | Freq: Once | ORAL | Status: AC
  Administered 2020-04-02: 17:00:00 40 meq via ORAL
  Filled 2020-04-02: qty 2

## 2020-04-02 MED ORDER — STERILE WATER FOR INJECTION IJ SOLN
INTRAMUSCULAR | Status: AC
  Filled 2020-04-02: qty 10

## 2020-04-02 MED ORDER — STERILE WATER FOR INJECTION IJ SOLN
INTRAMUSCULAR | Status: AC
  Administered 2020-04-02: 13:00:00 1.2 mL via INTRAMUSCULAR
  Filled 2020-04-02: qty 10

## 2020-04-02 NOTE — ED Notes (Addendum)
Pt jumped out of bed, attempted to leave room, and warned staff "I'm going to flash out of here." Security called for assistance. Pt directed back to bed and informed of current status by RN. Sitter at bedside.

## 2020-04-02 NOTE — ED Triage Notes (Signed)
Patient BIB [employer], under IVC (completed by patients mother). Patient IVC states patient has been seeing things, thinking [employer] is after him. Patient is alert in triage, however very uncooperative. Patient also verbalizing incomplete thoughts. Patient in handcuffs, attempting to throw himself off of bed. PA Sophia at bedside.

## 2020-04-02 NOTE — ED Notes (Signed)
Rounded on pt, pt is currently asleep and resting comfortably at this time. Sitter at bedside.

## 2020-04-02 NOTE — BH Assessment (Signed)
[facility] Assessment Progress Note  Case was staffed with Tate NP who recommended a inpatient admission for stabilization.   

## 2020-04-02 NOTE — ED Notes (Signed)
Restraints removed at this time, patient in bed with eyes closes. Respirations even, unlabored. No skin breakdown/injury r/t restraints

## 2020-04-02 NOTE — ED Notes (Signed)
Attempted to get EKG. Pt sleeping on Left side. Attempted to wake pt with touch & voice, but no response. Will attempt again at later time.
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
## 2020-04-02 NOTE — ED Notes (Signed)
Pt becoming increasingly agitated and trying to leave. Pt is grabbing his clothes and attempting to leave the room. Nurse and sitter trying to redirect pt

## 2020-04-02 NOTE — ED Notes (Signed)
Patient offered fluids/toileting. Declined both at this time.

## 2020-04-02 NOTE — Progress Notes (Signed)
Patient meets criteria for inpatient treatment per Berneice Heinrich, NP. No appropriate beds at [facility] currently. CSW faxed referrals to the following facilities for review:  [facility]   [facility]   [facility]   [facility]   [facility]   [facility]   [facility]   [facility]   [facility]    TTS will continue to seek bed placement.     Ruthann Cancer MSW, [facility] Social Worker Disposition  [facility] Ph: [phone] Fax: [phone] 04/02/2020 4:53 PM
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
## 2020-04-02 NOTE — ED Provider Notes (Signed)
1:27 PM Please see full attestation for additional details of the patient encounter.  Now, after his initial evaluation, and tolerance of oral Geodon patient is violent, aggressive, danger to himself and to multiple staff members were attempting to restrain him. In addition to after mentioned Geodon he will require IM Ativan, restraints.  10:03 PM Patient was sedated after his initial outburst, but after awakening had a similar episode of agitation, aggressiveness, requiring additional sedation.  In the interim he has been seen and evaluated by our behavioral health colleagues, designated for inpatient therapy.   Gerhard Munch, MD 04/02/20 2204

## 2020-04-02 NOTE — ED Notes (Signed)
Patient became combative, trying to jump out of bed. Uncooperative. MD lockwood notified. Orders given to apply violent restraints.

## 2020-04-03 LAB — RESPIRATORY PANEL BY RT PCR (FLU A&B, COVID)
Influenza A by PCR: NEGATIVE
Influenza B by PCR: NEGATIVE
SARS Coronavirus 2 by RT PCR: NEGATIVE

## 2020-04-03 MED ORDER — THIAMINE HCL 100 MG/ML IJ SOLN: 100.0000 mg | Freq: Once | INTRAMUSCULAR | Status: DC

## 2020-04-03 MED ORDER — LORAZEPAM 1 MG PO TABS: 1.0000 mg | ORAL_TABLET | Freq: Every day | ORAL | Status: DC

## 2020-04-03 MED ORDER — HALOPERIDOL LACTATE 5 MG/ML IJ SOLN: 10.0000 mg | Freq: Three times a day (TID) | INTRAMUSCULAR | Status: DC

## 2020-04-03 MED ORDER — HYDROXYZINE HCL 25 MG PO TABS: 25.0000 mg | ORAL_TABLET | Freq: Four times a day (QID) | ORAL | Status: DC | PRN

## 2020-04-03 MED ORDER — ONDANSETRON 4 MG PO TBDP: 4.0000 mg | ORAL_TABLET | Freq: Four times a day (QID) | ORAL | Status: DC | PRN

## 2020-04-03 MED ORDER — HALOPERIDOL 5 MG PO TABS: 10.0000 mg | ORAL_TABLET | Freq: Three times a day (TID) | ORAL | Status: DC

## 2020-04-03 MED ORDER — STERILE WATER FOR INJECTION IJ SOLN
INTRAMUSCULAR | Status: AC
  Filled 2020-04-03: qty 10

## 2020-04-03 MED ORDER — ADULT MULTIVITAMIN W/MINERALS CH
1.0000 | ORAL_TABLET | Freq: Every day | ORAL | Status: DC
  Administered 2020-04-04: 10:00:00 1 via ORAL
  Filled 2020-04-03: qty 1

## 2020-04-03 MED ORDER — LORAZEPAM 1 MG PO TABS: 1.0000 mg | ORAL_TABLET | Freq: Two times a day (BID) | ORAL | Status: DC

## 2020-04-03 MED ORDER — LORAZEPAM 1 MG PO TABS
1.0000 mg | ORAL_TABLET | Freq: Three times a day (TID) | ORAL | Status: DC
  Administered 2020-04-04: 16:00:00 1 mg via ORAL
  Filled 2020-04-03: qty 1

## 2020-04-03 MED ORDER — LORAZEPAM 1 MG PO TABS: 1.0000 mg | ORAL_TABLET | Freq: Four times a day (QID) | ORAL | Status: DC | PRN

## 2020-04-03 MED ORDER — ZIPRASIDONE MESYLATE 20 MG IM SOLR
20.0000 mg | Freq: Once | INTRAMUSCULAR | Status: AC
  Administered 2020-04-03: 09:00:00 20 mg via INTRAMUSCULAR
  Filled 2020-04-03: qty 20

## 2020-04-03 MED ORDER — LORAZEPAM 1 MG PO TABS
1.0000 mg | ORAL_TABLET | Freq: Four times a day (QID) | ORAL | Status: AC
  Administered 2020-04-03 – 2020-04-04 (×3): 1 mg via ORAL
  Filled 2020-04-03 (×3): qty 1

## 2020-04-03 MED ORDER — LOPERAMIDE HCL 2 MG PO CAPS: 2.0000 mg | ORAL_CAPSULE | ORAL | Status: DC | PRN

## 2020-04-03 MED ORDER — THIAMINE HCL 100 MG PO TABS
100.0000 mg | ORAL_TABLET | Freq: Every day | ORAL | Status: DC
  Administered 2020-04-04: 10:00:00 100 mg via ORAL
  Filled 2020-04-03: qty 1

## 2020-04-03 MED ORDER — HALOPERIDOL 5 MG PO TABS
10.0000 mg | ORAL_TABLET | Freq: Three times a day (TID) | ORAL | Status: DC
  Administered 2020-04-03 – 2020-04-04 (×4): 10 mg via ORAL
  Filled 2020-04-03 (×5): qty 2

## 2020-04-03 NOTE — ED Provider Notes (Signed)
Emergency Medicine Observation Re-evaluation Note  Gabriel Nelson is a 19 y.o. male, seen on rounds today.  Pt initially presented to the ED for complaints of psychiatric illness. Currently, the patient is agitated and angry about being here.  Physical Exam  BP (!) 147/98 (BP Location: Right Arm)   Pulse 76   Temp 98.1 F (36.7 C) (Oral)   Resp 18   Ht 1.778 m (5\' 10" )   Wt 85.3 kg   SpO2 100%   BMI 26.98 kg/m  Physical Exam Alert, agitated, disruptive ED Course / MDM  EKG:EKG Interpretation  Date/Time:  Saturday Apr 02 2020 17:05:36 EDT Ventricular Rate:  77 PR Interval:    QRS Duration: 88 QT Interval:  371 QTC Calculation: 420 R Axis:   113 Text Interpretation: Sinus rhythm Right axis deviation Borderline Q waves in lateral leads Repol abnrm suggests ischemia, inferior leads T wave abnormality Artifact Abnormal ECG Confirmed by 04-06-1986 [id number]) on 04/02/2020 5:31:43 PM    I have reviewed the labs performed to date as well as medications administered while in observation.  Recent changes in the last 24 hours include labs were notable for [facility] and a very low level alcohol.  LFTs were elevated and potassium is decreased at 2.9.  Patient was given oral potassium. Plan  Current plan is for inpatient treatment. Patient is under full IVC at this time.   [facility], MD 04/03/20 [phone]

## 2020-04-03 NOTE — Consult Note (Signed)
[facility] Psych ED Progress Note  04/03/2020 12:24 PM Gabriel Nelson  MRN:  [id number] Subjective:  " I am about to flash, planning I am crazy, stop playing, and I know this one by heart." Patient assessed by nurse practitioner.  Patient appeared asleep upon my approach, patient easily awakened. Patient alert and oriented. Patient denies suicidal homicidal ideations.  Patient denies auditory visual hallucinations. Patient exhibits disorganized behavior and irritable mood.  Patient making repetitive upper extremity movements, patient appears to be gesturing toward doorway. Patient appears paranoid states " stop planning on crazy."  Patient reports home meds include Risperdal and Zoloft, patient reports noncompliance with medications for approximately 6 weeks. Patient insists "when can I go home?" Offered support and encouragement.     Principal Problem: <principal problem not specified> Diagnosis:  Active Problems:   * No active hospital problems. *  Total Time spent with patient: 20 minutes  Past Psychiatric History: Marijuana intoxication, adjustment disorder with mixed disturbance of emotions and conduct  Past Medical History:  Past Medical History:  Diagnosis Date  . ADHD   . Medical history non-contributory    No past surgical history on file. Family History:  Family History  Problem Relation Age of Onset  . Healthy Mother    Family Psychiatric  History: Unknown Social History:  Social History   Substance and Sexual Activity  Alcohol Use Yes   Comment: He states occassionally, reports last beer was when his brother died.      Social History   Substance and Sexual Activity  Drug Use Yes  . Types: Marijuana, Barbituates   Comment: Daily. Last used: yesterday     Social History   Socioeconomic History  . Marital status: Single    Spouse name: Not on file  . Number of children: Not on file  . Years of education: Not on file  . Highest education level: Not on file   Occupational History  . Not on file  Tobacco Use  . Smoking status: Current Every Day Smoker    Packs/day: 0.25    Years: 4.00    Pack years: 1.00    Types: Cigarettes, Cigars  . Smokeless tobacco: Never Used  Substance and Sexual Activity  . Alcohol use: Yes    Comment: He states occassionally, reports last beer was when his brother died.   . Drug use: Yes    Types: Marijuana, Barbituates    Comment: Daily. Last used: yesterday   . Sexual activity: Not on file    Comment: uta, patient not fully oriented  Other Topics Concern  . Not on file  Social History Narrative  . Not on file   Social Determinants of Health   Financial Resource Strain:   . Difficulty of Paying Living Expenses:   Food Insecurity:   . Worried About Charity fundraiser in the Last Year:   . Arboriculturist in the Last Year:   Transportation Needs:   . Film/video editor (Medical):   Marland Kitchen Lack of Transportation (Non-Medical):   Physical Activity:   . Days of Exercise per Week:   . Minutes of Exercise per Session:   Stress:   . Feeling of Stress :   Social Connections:   . Frequency of Communication with Friends and Family:   . Frequency of Social Gatherings with Friends and Family:   . Attends Religious Services:   . Active Member of Clubs or Organizations:   . Attends Archivist Meetings:   .  Marital Status:     Sleep: Fair  Appetite:  Fair  Current Medications: Current Facility-Administered Medications  Medication Dose Route Frequency Provider Last Rate Last Admin  . haloperidol (HALDOL) tablet 10 mg  10 mg Oral TID Patrcia Dolly, FNP       Or  . haloperidol lactate (HALDOL) injection 10 mg  10 mg Intramuscular TID Patrcia Dolly, FNP      . hydrOXYzine (ATARAX/VISTARIL) tablet 25 mg  25 mg Oral Q6H PRN Patrcia Dolly, FNP      . loperamide (IMODIUM) capsule 2-4 mg  2-4 mg Oral PRN Patrcia Dolly, FNP      . LORazepam (ATIVAN) tablet 1 mg  1 mg Oral Q6H PRN Patrcia Dolly, FNP      .  LORazepam (ATIVAN) tablet 1 mg  1 mg Oral QID Patrcia Dolly, FNP       Followed by  . [START ON 04/04/2020] LORazepam (ATIVAN) tablet 1 mg  1 mg Oral TID Patrcia Dolly, FNP       Followed by  . [START ON 04/05/2020] LORazepam (ATIVAN) tablet 1 mg  1 mg Oral BID Patrcia Dolly, FNP       Followed by  . [START ON 04/07/2020] LORazepam (ATIVAN) tablet 1 mg  1 mg Oral Daily Patrcia Dolly, FNP      . multivitamin with minerals tablet 1 tablet  1 tablet Oral Daily Berneice Heinrich L, FNP      . ondansetron (ZOFRAN-ODT) disintegrating tablet 4 mg  4 mg Oral Q6H PRN Patrcia Dolly, FNP      . potassium chloride SA (KLOR-CON) CR tablet 20 mEq  20 mEq Oral Daily Caccavale, Sophia, PA-C   20 mEq at 04/03/20 0958  . thiamine (B-1) injection 100 mg  100 mg Intramuscular Once Patrcia Dolly, FNP      . [START ON 04/04/2020] thiamine tablet 100 mg  100 mg Oral Daily Patrcia Dolly, FNP       Current Outpatient Medications  Medication Sig Dispense Refill  . acetaminophen (TYLENOL) 500 MG tablet Take 500 mg by mouth every 6 (six) hours as needed for mild pain.    . ARIPiprazole (ABILIFY) 10 MG tablet Take 1 tablet (10 mg total) by mouth daily. (Patient not taking: Reported on 03/25/2020) 30 tablet 0  . hydrOXYzine (ATARAX/VISTARIL) 25 MG tablet Take 1 tablet (25 mg total) by mouth 3 (three) times daily as needed for anxiety. (Patient not taking: Reported on 03/25/2020) 30 tablet 0  . ibuprofen (ADVIL) 200 MG tablet Take 400 mg by mouth every 6 (six) hours as needed for headache (pain).    . traZODone (DESYREL) 50 MG tablet Take 1 tablet (50 mg total) by mouth at bedtime as needed for sleep. (Patient not taking: Reported on 03/25/2020) 30 tablet 0    Lab Results:  Results for orders placed or performed during the hospital encounter of 04/02/20 (from the past 48 hour(s))  CBC     Status: None   Collection Time: 04/02/20  1:02 PM  Result Value Ref Range   WBC 9.5 4.0 - 10.5 K/uL    Comment: WHITE COUNT CONFIRMED ON SMEAR   RBC  4.58 4.22 - 5.81 MIL/uL   Hemoglobin 13.7 13.0 - 17.0 g/dL   HCT 73.7 10.6 - 26.9 %   MCV 88.6 80.0 - 100.0 fL   MCH 29.9 26.0 - 34.0 pg   MCHC 33.7 30.0 - 36.0 g/dL  RDW 12.0 11.5 - 15.5 %   Platelets 185 150 - 400 K/uL   nRBC 0.0 0.0 - 0.2 %    Comment: Performed at [facility], [address], [place], [place] [address]  Comprehensive metabolic panel     Status: Abnormal   Collection Time: 04/02/20  1:02 PM  Result Value Ref Range   Sodium 138 135 - 145 mmol/L   Potassium 2.9 (L) 3.5 - 5.1 mmol/L   Chloride 101 98 - 111 mmol/L   CO2 26 22 - 32 mmol/L   Glucose, Bld 99 70 - 99 mg/dL    Comment: Glucose reference range applies only to samples taken after fasting for at least 8 hours.   BUN 5 (L) 6 - 20 mg/dL   Creatinine, Ser 4.27 0.61 - 1.24 mg/dL   Calcium 9.1 8.9 - 06.2 mg/dL   Total Protein 7.6 6.5 - 8.1 g/dL   Albumin 4.6 3.5 - 5.0 g/dL   AST 88 (H) 15 - 41 U/L   ALT 47 (H) 0 - 44 U/L   Alkaline Phosphatase 46 38 - 126 U/L   Total Bilirubin 1.9 (H) 0.3 - 1.2 mg/dL   GFR calc non Af Amer >60 >60 mL/min   GFR calc Af Amer >60 >60 mL/min   Anion gap 11 5 - 15    Comment: Performed at [facility], [address], [place], [place] [address]  Ethanol     Status: Abnormal   Collection Time: 04/02/20  1:04 PM  Result Value Ref Range   Alcohol, Ethyl (B) 11 (H) <10 mg/dL    Comment: (NOTE) Lowest detectable limit for serum alcohol is 10 mg/dL. For medical purposes only. Performed at [facility], [address] [place], [place] [address]   Salicylate level     Status: Abnormal   Collection Time: 04/02/20  1:04 PM  Result Value Ref Range   Salicylate Lvl <7.0 (L) 7.0 - 30.0 mg/dL    Comment: Performed at [facility], [address] [place], [place] [address]  Acetaminophen level     Status: Abnormal   Collection Time: 04/02/20  1:04 PM  Result Value Ref Range   Acetaminophen (Tylenol),  Serum <10 (L) 10 - 30 ug/mL    Comment: (NOTE) Therapeutic concentrations vary significantly. A range of 10-30 ug/mL  may be an effective concentration for many patients. However, some  are best treated at concentrations outside of this range. Acetaminophen concentrations >150 ug/mL at 4 hours after ingestion  and >50 ug/mL at 12 hours after ingestion are often associated with  toxic reactions. Performed at [facility], [address], [place], [place] [address]   Rapid urine drug screen (hospital performed)     Status: Abnormal   Collection Time: 04/02/20  1:04 PM  Result Value Ref Range   Opiates NONE DETECTED NONE DETECTED   Cocaine NONE DETECTED NONE DETECTED   Benzodiazepines NONE DETECTED NONE DETECTED   Amphetamines NONE DETECTED NONE DETECTED   Tetrahydrocannabinol POSITIVE (A) NONE DETECTED   Barbiturates NONE DETECTED NONE DETECTED    Comment: (NOTE) DRUG SCREEN FOR MEDICAL PURPOSES ONLY.  IF CONFIRMATION IS NEEDED FOR ANY PURPOSE, NOTIFY LAB WITHIN 5 DAYS. LOWEST DETECTABLE LIMITS FOR URINE DRUG SCREEN Drug Class                     Cutoff (ng/mL) Amphetamine and metabolites    1000 Barbiturate and metabolites  200 Benzodiazepine                 200 Tricyclics and metabolites     300 Opiates and metabolites        300 Cocaine and metabolites        300 THC                            50 Performed at [facility], [address] [place], [place] [address]   Respiratory Panel by RT PCR (Flu A&B, Covid) - Nasopharyngeal Swab     Status: None   Collection Time: 04/02/20  8:09 PM   Specimen: Nasopharyngeal Swab  Result Value Ref Range   SARS Coronavirus 2 by RT PCR NEGATIVE NEGATIVE    Comment: (NOTE) SARS-CoV-2 target nucleic acids are NOT DETECTED. The SARS-CoV-2 RNA is generally detectable in upper respiratoy specimens during the acute phase of infection. The lowest concentration of SARS-CoV-2 viral copies this assay can  detect is 131 copies/mL. A negative result does not preclude SARS-Cov-2 infection and should not be used as the sole basis for treatment or other patient management decisions. A negative result may occur with  improper specimen collection/handling, submission of specimen other than nasopharyngeal swab, presence of viral mutation(s) within the areas targeted by this assay, and inadequate number of viral copies (<131 copies/mL). A negative result must be combined with clinical observations, patient history, and epidemiological information. The expected result is Negative. Fact Sheet for Patients:  https://www.moore.com/https://www.fda.gov/media/142436/download Fact Sheet for Healthcare Providers:  https://www.young.biz/https://www.fda.gov/media/142435/download This test is not yet ap proved or cleared by the [place] FDA and  has been authorized for detection and/or diagnosis of SARS-CoV-2 by FDA under an Emergency Use Authorization (EUA). This EUA will remain  in effect (meaning this test can be used) for the duration of the COVID-19 declaration under Section 564(b)(1) of the Act, 21 U.S.C. section 360bbb-3(b)(1), unless the authorization is terminated or revoked sooner.    Influenza A by PCR NEGATIVE NEGATIVE   Influenza B by PCR NEGATIVE NEGATIVE    Comment: (NOTE) The Xpert Xpress SARS-CoV-2/FLU/RSV assay is intended as an aid in  the diagnosis of influenza from Nasopharyngeal swab specimens and  should not be used as a sole basis for treatment. Nasal washings and  aspirates are unacceptable for Xpert Xpress SARS-CoV-2/FLU/RSV  testing. Fact Sheet for Patients: https://www.moore.com/https://www.fda.gov/media/142436/download Fact Sheet for Healthcare Providers: https://www.young.biz/https://www.fda.gov/media/142435/download This test is not yet approved or cleared by the [place] FDA and  has been authorized for detection and/or diagnosis of SARS-CoV-2 by  FDA under an Emergency Use Authorization (EUA). This EUA will remain  in effect (meaning this test  can be used) for the duration of the  Covid-19 declaration under Section 564(b)(1) of the Act, 21  U.S.C. section 360bbb-3(b)(1), unless the authorization is  terminated or revoked. Performed at [facility], [address] [place], [place] [address]     Blood Alcohol level:  Lab Results  Component Value Date   ETH 11 (H) 04/02/2020   ETH <10 03/31/2020    Physical Findings: AIMS:  , ,  ,  ,    CIWA:    COWS:     Musculoskeletal: Strength & Muscle Tone: within normal limits Gait & Station: normal Patient leans: N/A  Psychiatric Specialty Exam: Physical Exam Vitals and nursing note reviewed.  Constitutional:      Appearance: He is well-developed.  HENT:  Head: Normocephalic.  Cardiovascular:     Rate and Rhythm: Normal rate.  Pulmonary:     Effort: Pulmonary effort is normal.  Neurological:     Mental Status: He is alert and oriented to person, place, and time.  Psychiatric:        Attention and Perception: He is inattentive.        Mood and Affect: Affect is labile.        Speech: Speech is slurred.        Behavior: Behavior is cooperative.        Thought Content: Thought content is paranoid.        Cognition and Memory: Cognition is impaired.        Judgment: Judgment is inappropriate.     Review of Systems  Constitutional: Negative.   HENT: Negative.   Eyes: Negative.   Respiratory: Negative.   Cardiovascular: Negative.   Gastrointestinal: Negative.   Genitourinary: Negative.   Musculoskeletal: Negative.   Skin: Negative.   Neurological: Negative.   Psychiatric/Behavioral: Positive for behavioral problems.    Blood pressure (!) 147/98, pulse 76, temperature 98.1 F (36.7 C), temperature source Oral, resp. rate 18, height 5\' 10"  (1.778 m), weight 85.3 kg, SpO2 100 %.Body mass index is 26.98 kg/m.  General Appearance: Disheveled  Eye Contact:  Fair  Speech:  Pressured  Volume:  Normal  Mood:  Anxious and Irritable  Affect:   Non-Congruent and Labile  Thought Process:  Disorganized and Descriptions of Associations: Tangential  Orientation:  Full (Time, Place, and Person)  Thought Content:  Paranoid Ideation and Tangential  Suicidal Thoughts:  No  Homicidal Thoughts:  No  Memory:  Immediate;   Good  Judgement:  Impaired  Insight:  Lacking  Psychomotor Activity:  Restlessness  Concentration:  Concentration: Poor  Recall:  Fair  Fund of Knowledge:  Good  Language:  Good  Akathisia:  No  Handed:  Right  AIMS (if indicated):     Assets:  Communication Skills Desire for Improvement Financial Resources/Insurance Housing Intimacy Leisure Time Physical Health Resilience  ADL's:  Intact  Cognition:  WNL  Sleep:         Treatment Plan Summary: Patient discussed with Dr. . Recommend inpatient psychiatric treatment. Medication management Haldol 10 mg p.o. or IM 3 times daily-may hold for oversedation.  CIWA/Ativan detox protocol ordered.  Jola Babinski, FNP 04/03/2020, 12:24 PM

## 2020-04-03 NOTE — ED Notes (Signed)
Pt labile. Pt hyperverbal.restless  Pt compliant with medication. Pt resting comfortably at this time.

## 2020-04-03 NOTE — ED Notes (Signed)
Pt to room 30. Pt cooperative, pt ambulated to room. Pt sleeping, resting comfortably

## 2020-04-03 NOTE — ED Notes (Signed)
Pt agitated and making threats about "walking out" and threatening to fight people. Pt is raising his voice and stating he "doesn't need to talk to psychiatrist" "im not no psychopath"

## 2020-04-03 NOTE — ED Notes (Signed)
Pt refused vitals, RN has been notified.

## 2020-04-04 ENCOUNTER — Other Ambulatory Visit: Payer: Self-pay

## 2020-04-04 ENCOUNTER — Encounter (HOSPITAL_COMMUNITY): Payer: Self-pay | Admitting: Registered Nurse

## 2020-04-04 ENCOUNTER — Inpatient Hospital Stay (HOSPITAL_COMMUNITY)
Admission: AD | Admit: 2020-04-04 | Discharge: 2020-04-07 | DRG: 885 | Disposition: A | Discharge status: Home / Self Care | Payer: Medicaid Other | Attending: Psychiatry | Admitting: Psychiatry

## 2020-04-04 DIAGNOSIS — F329 Major depressive disorder, single episode, unspecified: Secondary | ICD-10-CM | POA: Diagnosis present

## 2020-04-04 DIAGNOSIS — I10 Essential (primary) hypertension: Secondary | ICD-10-CM | POA: Diagnosis present

## 2020-04-04 DIAGNOSIS — Z9114 Patient's other noncompliance with medication regimen: Secondary | ICD-10-CM | POA: Diagnosis not present

## 2020-04-04 DIAGNOSIS — F12929 Cannabis use, unspecified with intoxication, unspecified: Secondary | ICD-10-CM | POA: Diagnosis present

## 2020-04-04 DIAGNOSIS — Z79899 Other long term (current) drug therapy: Secondary | ICD-10-CM | POA: Diagnosis not present

## 2020-04-04 DIAGNOSIS — F1290 Cannabis use, unspecified, uncomplicated: Secondary | ICD-10-CM | POA: Diagnosis present

## 2020-04-04 DIAGNOSIS — F4325 Adjustment disorder with mixed disturbance of emotions and conduct: Secondary | ICD-10-CM | POA: Diagnosis present

## 2020-04-04 DIAGNOSIS — F909 Attention-deficit hyperactivity disorder, unspecified type: Secondary | ICD-10-CM | POA: Diagnosis present

## 2020-04-04 DIAGNOSIS — F29 Unspecified psychosis not due to a substance or known physiological condition: Secondary | ICD-10-CM | POA: Diagnosis present

## 2020-04-04 DIAGNOSIS — Z791 Long term (current) use of non-steroidal anti-inflammatories (NSAID): Secondary | ICD-10-CM

## 2020-04-04 DIAGNOSIS — Z781 Physical restraint status: Secondary | ICD-10-CM | POA: Diagnosis not present

## 2020-04-04 DIAGNOSIS — F17210 Nicotine dependence, cigarettes, uncomplicated: Secondary | ICD-10-CM | POA: Diagnosis present

## 2020-04-04 DIAGNOSIS — G4700 Insomnia, unspecified: Secondary | ICD-10-CM | POA: Diagnosis present

## 2020-04-04 DIAGNOSIS — Z20822 Contact with and (suspected) exposure to covid-19: Secondary | ICD-10-CM | POA: Diagnosis present

## 2020-04-04 MED ORDER — HYDROXYZINE HCL 25 MG PO TABS: 25.0000 mg | ORAL_TABLET | Freq: Four times a day (QID) | ORAL | Status: DC | PRN

## 2020-04-04 MED ORDER — MAGNESIUM HYDROXIDE 400 MG/5ML PO SUSP
30.0000 mL | Freq: Every day | ORAL | Status: DC | PRN
  Administered 2020-04-07: 30 mL via ORAL

## 2020-04-04 MED ORDER — LORAZEPAM 1 MG PO TABS: 1.0000 mg | ORAL_TABLET | Freq: Four times a day (QID) | ORAL | Status: DC | PRN

## 2020-04-04 MED ORDER — ADULT MULTIVITAMIN W/MINERALS CH
1.0000 | ORAL_TABLET | Freq: Every day | ORAL | Status: DC
  Administered 2020-04-05: 1 via ORAL
  Filled 2020-04-04 (×2): qty 1

## 2020-04-04 MED ORDER — LORAZEPAM 1 MG PO TABS: 1.0000 mg | ORAL_TABLET | Freq: Two times a day (BID) | ORAL | Status: DC

## 2020-04-04 MED ORDER — LORAZEPAM 1 MG PO TABS: 1.0000 mg | ORAL_TABLET | Freq: Every day | ORAL | Status: DC

## 2020-04-04 MED ORDER — THIAMINE HCL 100 MG PO TABS
100.0000 mg | ORAL_TABLET | Freq: Every day | ORAL | Status: DC
  Administered 2020-04-05: 100 mg via ORAL
  Filled 2020-04-04 (×2): qty 1

## 2020-04-04 MED ORDER — LORAZEPAM 1 MG PO TABS
1.0000 mg | ORAL_TABLET | Freq: Three times a day (TID) | ORAL | Status: DC
  Administered 2020-04-05: 1 mg via ORAL
  Filled 2020-04-04: qty 1

## 2020-04-04 MED ORDER — HALOPERIDOL LACTATE 5 MG/ML IJ SOLN
10.0000 mg | Freq: Three times a day (TID) | INTRAMUSCULAR | Status: DC
  Filled 2020-04-04 (×4): qty 2

## 2020-04-04 MED ORDER — HALOPERIDOL 5 MG PO TABS
10.0000 mg | ORAL_TABLET | Freq: Three times a day (TID) | ORAL | Status: DC
  Administered 2020-04-05: 10 mg via ORAL
  Filled 2020-04-04 (×4): qty 2

## 2020-04-04 MED ORDER — LOPERAMIDE HCL 2 MG PO CAPS: 2.0000 mg | ORAL_CAPSULE | ORAL | Status: DC | PRN

## 2020-04-04 MED ORDER — ONDANSETRON 4 MG PO TBDP: 4.0000 mg | ORAL_TABLET | Freq: Four times a day (QID) | ORAL | Status: DC | PRN

## 2020-04-04 MED ORDER — ALUM & MAG HYDROXIDE-SIMETH 200-200-20 MG/5ML PO SUSP: 30.0000 mL | ORAL | Status: DC | PRN

## 2020-04-04 MED ORDER — POTASSIUM CHLORIDE CRYS ER 20 MEQ PO TBCR
20.0000 meq | EXTENDED_RELEASE_TABLET | Freq: Every day | ORAL | Status: DC
  Administered 2020-04-05: 20 meq via ORAL
  Filled 2020-04-04 (×2): qty 1

## 2020-04-04 MED ORDER — ACETAMINOPHEN 325 MG PO TABS: 650.0000 mg | ORAL_TABLET | Freq: Four times a day (QID) | ORAL | Status: DC | PRN

## 2020-04-04 NOTE — Consult Note (Signed)
[facility] Psych ED Progress Note  04/04/2020 1:13 PM Gabriel Nelson  MRN:  [id number]    Subjective: 04/04/20:  "I know like I was in the parking lot and my mom told me to go in and I said okay."  Assessment:  [name], 19 y.o., male patient seen via tele psych by this provider, Dr. Lucianne Muss; and chart reviewed on 04/04/20.  On evaluation Gabriel Nelson states "I'm good how are you."  Patient reporting that he is not sure why his mother wanted him to come to the hospital and is not sure of what she could be concerned about.  States that he is not feeling paranoid but his mother thinks he is paranoia.  Patient unable to clearly answer questions asked during assessment "I take medicine you know what it is better than I do.  I know it helps with me not lying when I take it."  Stated "I don't play with the voices; I don't play like that."  During evaluation Gabriel Nelson is alert/oriented x to person and place; calm/cooperative.  Patient is rambling, tangential, and disorganized.      04/03/2020:" I am about to flash, planning I am crazy, stop playing, and I know this one by heart." Patient assessed by nurse practitioner.  Patient appeared asleep upon my approach, patient easily awakened. Patient alert and oriented. Patient denies suicidal homicidal ideations.  Patient denies auditory visual hallucinations. Patient exhibits disorganized behavior and irritable mood.  Patient making repetitive upper extremity movements, patient appears to be gesturing toward doorway. Patient appears paranoid states " stop planning on crazy."  Patient reports home meds include Risperdal and Zoloft, patient reports noncompliance with medications for approximately 6 weeks. Patient insists "when can I go home?" Offered support and encouragement.     Principal Problem: <principal problem not specified> Diagnosis:  Active Problems:   * No active hospital problems. *  Total Time spent with patient: 20 minutes  Past  Psychiatric History: Marijuana intoxication, adjustment disorder with mixed disturbance of emotions and conduct  Past Medical History:  Past Medical History:  Diagnosis Date  . ADHD   . Medical history non-contributory    History reviewed. No pertinent surgical history. Family History:  Family History  Problem Relation Age of Onset  . Healthy Mother    Family Psychiatric  History: Unknown Social History:  Social History   Substance and Sexual Activity  Alcohol Use Yes   Comment: He states occassionally, reports last beer was when his brother died.      Social History   Substance and Sexual Activity  Drug Use Yes  . Types: Marijuana, Barbituates   Comment: Daily. Last used: yesterday     Social History   Socioeconomic History  . Marital status: Single    Spouse name: Not on file  . Number of children: Not on file  . Years of education: Not on file  . Highest education level: Not on file  Occupational History  . Not on file  Tobacco Use  . Smoking status: Current Every Day Smoker    Packs/day: 0.25    Years: 4.00    Pack years: 1.00    Types: Cigarettes, Cigars  . Smokeless tobacco: Never Used  Substance and Sexual Activity  . Alcohol use: Yes    Comment: He states occassionally, reports last beer was when his brother died.   . Drug use: Yes    Types: Marijuana, Barbituates    Comment: Daily. Last used: yesterday   . Sexual  activity: Not on file    Comment: uta, patient not fully oriented  Other Topics Concern  . Not on file  Social History Narrative  . Not on file   Social Determinants of Health   Financial Resource Strain:   . Difficulty of Paying Living Expenses:   Food Insecurity:   . Worried About Programme researcher, broadcasting/film/video in the Last Year:   . Barista in the Last Year:   Transportation Needs:   . Freight forwarder (Medical):   Marland Kitchen Lack of Transportation (Non-Medical):   Physical Activity:   . Days of Exercise per Week:   . Minutes of  Exercise per Session:   Stress:   . Feeling of Stress :   Social Connections:   . Frequency of Communication with Friends and Family:   . Frequency of Social Gatherings with Friends and Family:   . Attends Religious Services:   . Active Member of Clubs or Organizations:   . Attends Banker Meetings:   Marland Kitchen Marital Status:     Sleep: Fair  Appetite:  Fair  Current Medications: Current Facility-Administered Medications  Medication Dose Route Frequency Provider Last Rate Last Admin  . haloperidol (HALDOL) tablet 10 mg  10 mg Oral TID Patrcia Dolly, FNP   10 mg at 04/04/20 6195   Or  . haloperidol lactate (HALDOL) injection 10 mg  10 mg Intramuscular TID Patrcia Dolly, FNP      . hydrOXYzine (ATARAX/VISTARIL) tablet 25 mg  25 mg Oral Q6H PRN Patrcia Dolly, FNP      . loperamide (IMODIUM) capsule 2-4 mg  2-4 mg Oral PRN Patrcia Dolly, FNP      . LORazepam (ATIVAN) tablet 1 mg  1 mg Oral Q6H PRN Patrcia Dolly, FNP      . LORazepam (ATIVAN) tablet 1 mg  1 mg Oral QID Patrcia Dolly, FNP   1 mg at 04/04/20 0949   Followed by  . LORazepam (ATIVAN) tablet 1 mg  1 mg Oral TID Patrcia Dolly, FNP       Followed by  . [START ON 04/05/2020] LORazepam (ATIVAN) tablet 1 mg  1 mg Oral BID Patrcia Dolly, FNP       Followed by  . [START ON 04/07/2020] LORazepam (ATIVAN) tablet 1 mg  1 mg Oral Daily Patrcia Dolly, FNP      . multivitamin with minerals tablet 1 tablet  1 tablet Oral Daily Patrcia Dolly, FNP   1 tablet at 04/04/20 0949  . ondansetron (ZOFRAN-ODT) disintegrating tablet 4 mg  4 mg Oral Q6H PRN Patrcia Dolly, FNP      . potassium chloride SA (KLOR-CON) CR tablet 20 mEq  20 mEq Oral Daily Caccavale, Sophia, PA-C   20 mEq at 04/04/20 0949  . thiamine (B-1) injection 100 mg  100 mg Intramuscular Once Patrcia Dolly, FNP      . thiamine tablet 100 mg  100 mg Oral Daily Patrcia Dolly, FNP   100 mg at 04/04/20 0932   Current Outpatient Medications  Medication Sig Dispense Refill  . acetaminophen  (TYLENOL) 500 MG tablet Take 500 mg by mouth every 6 (six) hours as needed for mild pain.    . ARIPiprazole (ABILIFY) 10 MG tablet Take 1 tablet (10 mg total) by mouth daily. (Patient not taking: Reported on 03/25/2020) 30 tablet 0  . hydrOXYzine (ATARAX/VISTARIL) 25 MG tablet Take 1 tablet (25 mg  total) by mouth 3 (three) times daily as needed for anxiety. (Patient not taking: Reported on 03/25/2020) 30 tablet 0  . ibuprofen (ADVIL) 200 MG tablet Take 400 mg by mouth every 6 (six) hours as needed for headache (pain).    . traZODone (DESYREL) 50 MG tablet Take 1 tablet (50 mg total) by mouth at bedtime as needed for sleep. (Patient not taking: Reported on 03/25/2020) 30 tablet 0    Lab Results:  Results for orders placed or performed during the hospital encounter of 04/02/20 (from the past 48 hour(s))  Respiratory Panel by RT PCR (Flu A&B, Covid) - Nasopharyngeal Swab     Status: None   Collection Time: 04/02/20  8:09 PM   Specimen: Nasopharyngeal Swab  Result Value Ref Range   SARS Coronavirus 2 by RT PCR NEGATIVE NEGATIVE    Comment: (NOTE) SARS-CoV-2 target nucleic acids are NOT DETECTED. The SARS-CoV-2 RNA is generally detectable in upper respiratoy specimens during the acute phase of infection. The lowest concentration of SARS-CoV-2 viral copies this assay can detect is 131 copies/mL. A negative result does not preclude SARS-Cov-2 infection and should not be used as the sole basis for treatment or other patient management decisions. A negative result may occur with  improper specimen collection/handling, submission of specimen other than nasopharyngeal swab, presence of viral mutation(s) within the areas targeted by this assay, and inadequate number of viral copies (<131 copies/mL). A negative result must be combined with clinical observations, patient history, and epidemiological information. The expected result is Negative. Fact Sheet for Patients:   PinkCheek.be Fact Sheet for Healthcare Providers:  GravelBags.it This test is not yet ap proved or cleared by the [place] FDA and  has been authorized for detection and/or diagnosis of SARS-CoV-2 by FDA under an Emergency Use Authorization (EUA). This EUA will remain  in effect (meaning this test can be used) for the duration of the COVID-19 declaration under Section 564(b)(1) of the Act, 21 U.S.C. section 360bbb-3(b)(1), unless the authorization is terminated or revoked sooner.    Influenza A by PCR NEGATIVE NEGATIVE   Influenza B by PCR NEGATIVE NEGATIVE    Comment: (NOTE) The Xpert Xpress SARS-CoV-2/FLU/RSV assay is intended as an aid in  the diagnosis of influenza from Nasopharyngeal swab specimens and  should not be used as a sole basis for treatment. Nasal washings and  aspirates are unacceptable for Xpert Xpress SARS-CoV-2/FLU/RSV  testing. Fact Sheet for Patients: PinkCheek.be Fact Sheet for Healthcare Providers: GravelBags.it This test is not yet approved or cleared by the [place] FDA and  has been authorized for detection and/or diagnosis of SARS-CoV-2 by  FDA under an Emergency Use Authorization (EUA). This EUA will remain  in effect (meaning this test can be used) for the duration of the  Covid-19 declaration under Section 564(b)(1) of the Act, 21  U.S.C. section 360bbb-3(b)(1), unless the authorization is  terminated or revoked. Performed at [facility], [place] [address] [place], [place] [address]     Blood Alcohol level:  Lab Results  Component Value Date   ETH 11 (H) 04/02/2020   ETH <10 03/31/2020    Physical Findings: AIMS:  , ,  ,  ,    CIWA:  CIWA-Ar Total: 5 COWS:     Musculoskeletal: Strength & Muscle Tone: within normal limits Gait & Station: normal Patient leans: N/A  Psychiatric Specialty  Exam: Physical Exam Vitals and nursing note reviewed.  Constitutional:      Appearance: He is  well-developed.  HENT:     Head: Normocephalic.  Cardiovascular:     Rate and Rhythm: Normal rate.  Pulmonary:     Effort: Pulmonary effort is normal.  Neurological:     Mental Status: He is alert and oriented to person, place, and time.  Psychiatric:        Attention and Perception: He is inattentive.        Mood and Affect: Affect is labile.        Speech: Speech is slurred.        Behavior: Behavior is cooperative.        Thought Content: Thought content is paranoid.        Cognition and Memory: Cognition is impaired.        Judgment: Judgment is inappropriate.     Review of Systems  Constitutional: Negative.   HENT: Negative.   Eyes: Negative.   Respiratory: Negative.   Cardiovascular: Negative.   Gastrointestinal: Negative.   Genitourinary: Negative.   Musculoskeletal: Negative.   Skin: Negative.   Neurological: Negative.   Psychiatric/Behavioral: Positive for behavioral problems.    Blood pressure (!) 153/95, pulse 95, temperature 98.5 F (36.9 C), temperature source Oral, resp. rate 20, height 5\' 10"  (1.778 m), weight 85.3 kg, SpO2 99 %.Body mass index is 26.98 kg/m.  General Appearance: Disheveled  Eye Contact:  Fair  Speech:  Pressured  Volume:  Normal  Mood:  Anxious and Irritable  Affect:  Non-Congruent and Labile  Thought Process:  Disorganized and Descriptions of Associations: Tangential  Orientation:  Full (Time, Place, and Person)  Thought Content:  Paranoid Ideation and Tangential  Suicidal Thoughts:  No  Homicidal Thoughts:  No  Memory:  Immediate;   Good  Judgement:  Impaired  Insight:  Lacking  Psychomotor Activity:  Restlessness  Concentration:  Concentration: Poor  Recall:  Fair  Fund of Knowledge:  Good  Language:  Good  Akathisia:  No  Handed:  Right  AIMS (if indicated):     Assets:  Communication Skills Desire for Improvement Financial  Resources/Insurance Housing Intimacy Leisure Time Physical Health Resilience  ADL's:  Intact  Cognition:  WNL  Sleep:       Treatment Plan Summary: Recommend inpatient psychiatric treatment. Medication management  Continue:  Haldol 10 mg p.o. or IM 3 times daily-may hold for oversedation.  CIWA/Ativan detox protocol ordered.   , NP 04/04/2020, 1:13 PM

## 2020-04-04 NOTE — ED Provider Notes (Signed)
Emergency Medicine Observation Re-evaluation Note  Gabriel Nelson is a 19 y.o. male, seen on rounds today.  Pt initially presented to the ED for complaints of No chief complaint on file. Currently, the patient is stablized, resting.  Physical Exam  BP (!) 154/108 (BP Location: Right Arm)   Pulse 75   Temp 98.2 F (36.8 C) (Oral)   Resp 16   Ht 5\' 10"  (1.778 m)   Wt 85.3 kg   SpO2 96%   BMI 26.98 kg/m  Physical Exam  ED Course / MDM  EKG:EKG Interpretation  Date/Time:  Saturday Apr 02 2020 17:05:36 EDT Ventricular Rate:  77 PR Interval:    QRS Duration: 88 QT Interval:  371 QTC Calculation: 420 R Axis:   113 Text Interpretation: Sinus rhythm Right axis deviation Borderline Q waves in lateral leads Repol abnrm suggests ischemia, inferior leads T wave abnormality Artifact Abnormal ECG Confirmed by 04-06-1986 [phone]) on 04/02/2020 5:31:43 PM    I have reviewed the labs performed to date as well as medications administered while in observation.  Recent changes in the last 24 hours include nothing . Plan  Current plan is for inpatient treatment. IVC placed.   06/02/2020, DO 04/04/20 0710

## 2020-04-04 NOTE — BH Assessment (Signed)
[facility] Assessment Progress Note  Per Shuvon Rankin, FNP, this pt requires psychiatric hospitalization.  Jasmine has assigned pt to [facility] Rm [id number]; [facility] will be ready to receive pt at 20:00.  Pt presents under IVC initiated by pt's mother, and upheld by EDP Gerhard Munch, MD, and IVC documents have been faxed to [facility].  Pt's nurse has been notified, and agrees to call report to [phone].  Pt is to be transported via Patent examiner.   Doylene Canning, [place] Behavioral Health Coordinator [phone]
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
## 2020-04-04 NOTE — Tx Team (Signed)
Initial Treatment Plan 04/04/2020 11:49 PM [name] MBT:[id number]    PATIENT STRESSORS: Legal issue Marital or family conflict Medication change or noncompliance Substance abuse   PATIENT STRENGTHS: Average or above average intelligence Communication skills Supportive family/friends   PATIENT IDENTIFIED PROBLEMS:    " My mom put me here" In denial    psychosis   smoking marijuana               DISCHARGE CRITERIA:  Improved stabilization in mood, thinking, and/or behavior Reduction of life-threatening or endangering symptoms to within safe limits  PRELIMINARY DISCHARGE PLAN: Outpatient therapy Return to previous living arrangement  PATIENT/FAMILY INVOLVEMENT: This treatment plan has been presented to and reviewed with the patient, [name], and/or family member, .  The patient and family have been given the opportunity to ask questions and make suggestions.  Andres Ege, RN 04/04/2020, 11:49 PM

## 2020-04-04 NOTE — Discharge Summary (Signed)
  Patient to be transferred to [facility] for inpatient psychiatric treatment 

## 2020-04-04 NOTE — ED Notes (Signed)
Reported to Gabriel Nelson that pt is going to [facility] after 8 pm.

## 2020-04-05 DIAGNOSIS — F29 Unspecified psychosis not due to a substance or known physiological condition: Principal | ICD-10-CM

## 2020-04-05 LAB — COMPREHENSIVE METABOLIC PANEL
ALT: 41 U/L (ref 0–44)
AST: 35 U/L (ref 15–41)
Albumin: 4.6 g/dL (ref 3.5–5.0)
Alkaline Phosphatase: 50 U/L (ref 38–126)
Anion gap: 10 (ref 5–15)
BUN: 14 mg/dL (ref 6–20)
CO2: 27 mmol/L (ref 22–32)
Calcium: 9.1 mg/dL (ref 8.9–10.3)
Chloride: 102 mmol/L (ref 98–111)
Creatinine, Ser: 1.01 mg/dL (ref 0.61–1.24)
GFR calc Af Amer: >60 mL/min (ref >60)
GFR calc non Af Amer: >60 mL/min (ref >60)
Glucose, Bld: 84 mg/dL (ref 70–99)
Potassium: 4 mmol/L (ref 3.5–5.1)
Sodium: 139 mmol/L (ref 135–145)
Total Bilirubin: 0.6 mg/dL (ref 0.3–1.2)
Total Protein: 7.8 g/dL (ref 6.5–8.1)

## 2020-04-05 MED ORDER — OLANZAPINE 5 MG PO TABS
5.0000 mg | ORAL_TABLET | Freq: Two times a day (BID) | ORAL | Status: DC
  Administered 2020-04-05 – 2020-04-07 (×4): 5 mg via ORAL
  Filled 2020-04-05 (×8): qty 1

## 2020-04-05 MED ORDER — NICOTINE 21 MG/24HR TD PT24
21.0000 mg | MEDICATED_PATCH | Freq: Every day | TRANSDERMAL | Status: DC
  Administered 2020-04-05 – 2020-04-07 (×3): 21 mg via TRANSDERMAL
  Filled 2020-04-05 (×7): qty 1

## 2020-04-05 MED ORDER — ZIPRASIDONE MESYLATE 20 MG IM SOLR: 20.0000 mg | INTRAMUSCULAR | Status: DC | PRN

## 2020-04-05 MED ORDER — POTASSIUM CHLORIDE CRYS ER 20 MEQ PO TBCR
20.0000 meq | EXTENDED_RELEASE_TABLET | Freq: Every day | ORAL | Status: DC
  Administered 2020-04-06 – 2020-04-07 (×2): 20 meq via ORAL
  Filled 2020-04-05 (×3): qty 1

## 2020-04-05 MED ORDER — OLANZAPINE 10 MG PO TBDP
10.0000 mg | ORAL_TABLET | Freq: Three times a day (TID) | ORAL | Status: DC | PRN
  Administered 2020-04-06 (×2): 10 mg via ORAL
  Filled 2020-04-05 (×2): qty 1

## 2020-04-05 MED ORDER — LORAZEPAM 1 MG PO TABS
1.0000 mg | ORAL_TABLET | ORAL | Status: AC | PRN
  Administered 2020-04-05: 1 mg via ORAL
  Filled 2020-04-05: qty 1
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
## 2020-04-05 NOTE — Progress Notes (Signed)
   04/05/20 2000  Psych Admission Type (Psych Patients Only)  Admission Status Involuntary  Psychosocial Assessment  Patient Complaints None  Eye Contact Fair  Facial Expression Animated  Affect Preoccupied  Speech Logical/coherent  Interaction Assertive  Motor Activity Restless  Appearance/Hygiene Unremarkable  Behavior Characteristics Cooperative  Thought Process  Coherency Disorganized;Tangential  Content Paranoia  Delusions Paranoid  Perception WDL  Hallucination None reported or observed  Judgment Impaired  Confusion None  Danger to Self  Current suicidal ideation? Denies  Danger to Others  Danger to Others None reported or observed
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
## 2020-04-05 NOTE — Progress Notes (Signed)
Recreation Therapy Notes  Date: 5.11.21 Time: 10:00 Location: [address] Dayroom   Group Topic: Leisure Education  Goal Area(s) Addresses:  Patient will identify positive leisure activities.  Patient will identify one positive benefit of participation in leisure activities.   Behavioral Response: Engaged  Intervention: Leisure [employer], Music  Activity: Keep It Contractor.  Patients were seated in a circle.  Patients were to keep the ball in motion for as long as possible.  LRT would keep count of how many times the ball was hit within that time frame.  The ball could be bounced or rolled to the next person.  If the ball came to a complete stop, the count would start over.   Education:  Leisure Education, Building control surveyor  Education Outcome: Acknowledges education/In group clarification offered/Needs additional education  Clinical Observations/Feedback: Pt was active during group session.  Pt was smiling and social with peers.  Pt expressed when he started getting tired and got water when he needed it.  Pt left group early to meet with doctor and didn't return.    Caroll Rancher, LRT/CTRS     Lillia Abed, Deanna Boehlke A 04/05/2020 11:01 AM
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
## 2020-04-05 NOTE — Progress Notes (Signed)
Pt heard in his room talking to himself. Pt door shut so he would not disturb other pts. Pt is okay with that.

## 2020-04-06 LAB — HEMOGLOBIN A1C
Hgb A1c MFr Bld: 4.6 % — ABNORMAL LOW (ref 4.8–5.6)
Mean Plasma Glucose: 85.32 mg/dL

## 2020-04-06 LAB — TSH: TSH: 1.387 u[IU]/mL (ref 0.350–4.500)

## 2020-04-06 LAB — LIPID PANEL
Cholesterol: 131 mg/dL (ref 0–200)
HDL: 35 mg/dL — ABNORMAL LOW (ref >40)
LDL Cholesterol: 89 mg/dL (ref 0–99)
Total CHOL/HDL Ratio: 3.7 RATIO
Triglycerides: 34 mg/dL (ref <150)
VLDL: 7 mg/dL (ref 0–40)

## 2020-04-06 MED ORDER — CLONIDINE HCL 0.2 MG PO TABS
0.2000 mg | ORAL_TABLET | Freq: Once | ORAL | Status: AC
  Administered 2020-04-06: 0.2 mg via ORAL
  Filled 2020-04-06: qty 1
  Filled 2020-04-06: qty 2

## 2020-04-06 MED ORDER — CLONIDINE HCL 0.1 MG PO TABS
0.1000 mg | ORAL_TABLET | Freq: Three times a day (TID) | ORAL | Status: DC | PRN
  Administered 2020-04-07: 0.1 mg via ORAL
  Filled 2020-04-06: qty 1

## 2020-04-06 MED ORDER — AMLODIPINE BESYLATE 5 MG PO TABS
5.0000 mg | ORAL_TABLET | Freq: Every day | ORAL | Status: DC
  Administered 2020-04-06 – 2020-04-07 (×2): 5 mg via ORAL
  Filled 2020-04-06 (×6): qty 1
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
## 2020-04-06 NOTE — Tx Team (Signed)
Interdisciplinary Treatment and Diagnostic Plan Update  04/06/2020 Time of Session: 9:00am [name] MRN: [id number]  Principal Diagnosis: Psychotic disorder [facility])  Secondary Diagnoses: Principal Problem:   Psychotic disorder (HCC)   Current Medications:  Current Facility-Administered Medications  Medication Dose Route Frequency Provider Last Rate Last Admin  . magnesium hydroxide (MILK OF MAGNESIA) suspension 30 mL  30 mL Oral Daily PRN Rankin, Shuvon B, NP      . nicotine (NICODERM CQ - dosed in mg/24 hours) patch 21 mg  21 mg Transdermal Daily Cobos, Rockey Situ, MD   21 mg at 04/06/20 0736  . OLANZapine (ZYPREXA) tablet 5 mg  5 mg Oral BID Cobos, Rockey Situ, MD   5 mg at 04/06/20 0736  . OLANZapine zydis (ZYPREXA) disintegrating tablet 10 mg  10 mg Oral Q8H PRN Cobos, Rockey Situ, MD       And  . ziprasidone (GEODON) injection 20 mg  20 mg Intramuscular PRN Cobos, Fernando A, MD      . potassium chloride SA (KLOR-CON) CR tablet 20 mEq  20 mEq Oral Daily Cobos, Rockey Situ, MD   20 mEq at 04/06/20 0734   PTA Medications: Medications Prior to Admission  Medication Sig Dispense Refill Last Dose  . acetaminophen (TYLENOL) 500 MG tablet Take 500 mg by mouth every 6 (six) hours as needed for mild pain.     . ARIPiprazole (ABILIFY) 10 MG tablet Take 1 tablet (10 mg total) by mouth daily. (Patient not taking: Reported on 03/25/2020) 30 tablet 0   . hydrOXYzine (ATARAX/VISTARIL) 25 MG tablet Take 1 tablet (25 mg total) by mouth 3 (three) times daily as needed for anxiety. (Patient not taking: Reported on 03/25/2020) 30 tablet 0   . ibuprofen (ADVIL) 200 MG tablet Take 400 mg by mouth every 6 (six) hours as needed for headache (pain).     . traZODone (DESYREL) 50 MG tablet Take 1 tablet (50 mg total) by mouth at bedtime as needed for sleep. (Patient not taking: Reported on 03/25/2020) 30 tablet 0     Patient Stressors: Legal issue Marital or family conflict Medication change or  noncompliance Substance abuse  Patient Strengths: Average or above average intelligence Communication skills Supportive family/friends  Treatment Modalities: Medication Management, Group therapy, Case management,  1 to 1 session with clinician, Psychoeducation, Recreational therapy.   Physician Treatment Plan for Primary Diagnosis: Psychotic disorder (HCC) Long Term Goal(s): Improvement in symptoms so as ready for discharge Improvement in symptoms so as ready for discharge   Short Term Goals: Ability to identify changes in lifestyle to reduce recurrence of condition will improve Ability to verbalize feelings will improve Ability to demonstrate self-control will improve Ability to identify and develop effective coping behaviors will improve Ability to maintain clinical measurements within normal limits will improve Compliance with prescribed medications will improve Ability to identify triggers associated with substance abuse/mental health issues will improve  Medication Management: Evaluate patient's response, side effects, and tolerance of medication regimen.  Therapeutic Interventions: 1 to 1 sessions, Unit Group sessions and Medication administration.  Evaluation of Outcomes: Progressing  Physician Treatment Plan for Secondary Diagnosis: Principal Problem:   Psychotic disorder (HCC)  Long Term Goal(s): Improvement in symptoms so as ready for discharge Improvement in symptoms so as ready for discharge   Short Term Goals: Ability to identify changes in lifestyle to reduce recurrence of condition will improve Ability to verbalize feelings will improve Ability to demonstrate self-control will improve Ability to identify and develop effective coping  behaviors will improve Ability to maintain clinical measurements within normal limits will improve Compliance with prescribed medications will improve Ability to identify triggers associated with substance abuse/mental health issues  will improve     Medication Management: Evaluate patient's response, side effects, and tolerance of medication regimen.  Therapeutic Interventions: 1 to 1 sessions, Unit Group sessions and Medication administration.  Evaluation of Outcomes: Progressing   RN Treatment Plan for Primary Diagnosis: Psychotic disorder ([place]) Long Term Goal(s): Knowledge of disease and therapeutic regimen to maintain health will improve  Short Term Goals: Ability to participate in decision making will improve, Ability to identify and develop effective coping behaviors will improve and Compliance with prescribed medications will improve  Medication Management: RN will administer medications as ordered by provider, will assess and evaluate patient's response and provide education to patient for prescribed medication. RN will report any adverse and/or side effects to prescribing provider.  Therapeutic Interventions: 1 on 1 counseling sessions, Psychoeducation, Medication administration, Evaluate responses to treatment, Monitor vital signs and CBGs as ordered, Perform/monitor CIWA, COWS, AIMS and Fall Risk screenings as ordered, Perform wound care treatments as ordered.  Evaluation of Outcomes: Progressing   LCSW Treatment Plan for Primary Diagnosis: Psychotic disorder [facility]) Long Term Goal(s): Safe transition to appropriate next level of care at discharge, Engage patient in therapeutic group addressing interpersonal concerns.  Short Term Goals: Engage patient in aftercare planning with referrals and resources, Increase social support, Facilitate acceptance of mental health diagnosis and concerns, Identify triggers associated with mental health/substance abuse issues and Increase skills for wellness and recovery  Therapeutic Interventions: Assess for all discharge needs, 1 to 1 time with Social worker, Explore available resources and support systems, Assess for adequacy in community support network, Educate family and  significant other(s) on suicide prevention, Complete Psychosocial Assessment, Interpersonal group therapy.  Evaluation of Outcomes: Progressing  Progress in Treatment: Attending groups: Yes. Participating in groups: Yes. Taking medication as prescribed: Yes. Toleration medication: Yes. Family/Significant other contact made: No, will contact:  supports if consents are granted. Patient understands diagnosis: Yes. Discussing patient identified problems/goals with staff: Yes. Medical problems stabilized or resolved: Yes. Denies suicidal/homicidal ideation: Yes. Issues/concerns per patient self-inventory: Yes.  New problem(s) identified: Yes, Describe:  aggression toward family.  New Short Term/Long Term Goal(s): medication management for mood stabilization; elimination of SI thoughts; development of comprehensive mental wellness/sobriety plan.  Patient Goals:  "Get a better mind state."  Discharge Plan or Barriers: Expected to return home with family, CSW assessing for appropriate referrals.  Reason for Continuation of Hospitalization: Anxiety Delusions  Medication stabilization  Estimated Length of Stay: 3-5 days  Attendees: Patient: Gabriel Nelson 04/06/2020 9:27 AM  Physician:  04/06/2020 9:27 AM  Nursing:  04/06/2020 9:27 AM  RN Care Manager: 04/06/2020 9:27 AM  Social Worker: Stephanie Acre, [place] 04/06/2020 9:27 AM  Recreational Therapist:  04/06/2020 9:27 AM  Other: Darletta Moll, [place] 04/06/2020 9:27 AM  Other: Marvia Pickles, NP 04/06/2020 9:27 AM  Other: 04/06/2020 9:27 AM    Scribe for Treatment Team: Joellen Jersey, [place] 04/06/2020 9:27 AM

## 2020-04-06 NOTE — Progress Notes (Signed)
Recreation Therapy Notes  Date: 5.12.21 Time: 1000 Location: 500 Hall  Group Topic: Communication, Team Building, Problem Solving  Goal Area(s) Addresses:  Patient will effectively work with peer towards shared goal.  Patient will identify skill used to make activity successful.  Patient will identify how skills used during activity can be used to reach post d/c goals.   Behavioral Response: Engaged  Intervention: Veterinary surgeon  Activity: [employer] in [employer].  Each person was given a rubber disc plus one extra disc for the group.  As a group, patients were to use the rubber discs to [facility] the group from one end of the hall to the other and back.  If anyone stepped off the disc, the group start over.  Education: Pharmacist, community, Building control surveyor.   Education Outcome: Acknowledges education/In group clarification offered/Needs additional education.   Clinical Observations/Feedback: Pt was active and worked well with peers.  Pt was at the head of the line so he set the tone for the rest of the group.  Pt stated the activity was pretty easy.  Pt worked well with peers in completing task.    Caroll Rancher, LRT/CTRS    Lillia Abed, Mechelle Pates A 04/06/2020 11:08 AM

## 2020-04-06 NOTE — Plan of Care (Signed)
Progress note  D: pt found in the hallway; compliant with medication administration. Pt denies any physical complaints or pain. Pt is animated but anxious. Pt states they hope to find a new living situation today, because the place where they live "isn't working out". Pt states they continue to argue with their mother. Pt seems positive though, stating that their goal for today is to make it to tomorrow. Has been visible on the unit but is watchful and seems paranoid at times. Pt is pleasant. Pt denies si/hi/ah/vh and verbally agrees to approach staff if these become apparent or before harming themself/others while at [facility].  A: Pt provided support and encouragement. Pt given medication per protocol and standing orders. Q35m safety checks implemented and continued.  R: Pt safe on the unit. Will continue to monitor.  Pt progressing in the following metrics  Problem: Education: Goal: Knowledge of [facility] Education information/materials will improve Outcome: Progressing Goal: Emotional status will improve Outcome: Progressing Goal: Mental status will improve Outcome: Progressing Goal: Verbalization of understanding the information provided will improve Outcome: Progressing

## 2020-04-06 NOTE — Progress Notes (Addendum)
[facility] MD Progress Note  04/06/2020 3:10 PM Gabriel Nelson  MRN:  [id number]   Subjective: Follow-up for this 19 year old male diagnosed with psychotic disorder.  Patient reports today that military stays at the hospital was because his sister dropped him off.  He denies having any issues at home.  He denies having any paranoia or any thoughts of anyone trying to follow him or come after him.  He denies any suicidal homicidal ideations and denies any hallucinations.  Patient reports that he is sleeping good and that his appetite is been good.  Patient reports that if he thought that someone was trying to come after him that he "would have moderate his P's and Q's."  Patient denies any medication side effects and has been compliant with those.  Patient is also reporting that he has been attending groups.  Principal Problem: Psychotic disorder ([place]) Diagnosis: Principal Problem:   Psychotic disorder ([place])  Total Time spent with patient: 20 minutes  Past Psychiatric History: Marijuana intoxication, adjustment disorder with mixed disturbance of emotions and conduct. Previous inpatient treatment in [place] in 2015, 10/2016, 11/2016, 07/2018 in [place]. 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  Denies any current outpatient provider, therapy or medications. He denies any history of suicide attempts. He denies any legal charges.   Past Medical History:  Past Medical History:  Diagnosis Date  . ADHD   . Medical history non-contributory     Past Surgical History:  Procedure Laterality Date  . NO PAST SURGERIES     Family History:  Family History  Problem Relation Age of Onset  . Healthy Mother    Family Psychiatric  History: Denies Social History:  Social History   Substance and Sexual Activity  Alcohol Use Yes   Comment: He states occassionally, reports last beer was when his brother died.      Social History   Substance and  Sexual Activity  Drug Use Yes  . Types: Marijuana   Comment: Daily. Last used: yesterday     Social History   Socioeconomic History  . Marital status: Single    Spouse name: Not on file  . Number of children: Not on file  . Years of education: Not on file  . Highest education level: Not on file  Occupational History  . Not on file  Tobacco Use  . Smoking status: Current Every Day Smoker    Packs/day: 1.00    Years: 4.00    Pack years: 4.00    Types: Cigarettes, Cigars  . Smokeless tobacco: Never Used  Substance and Sexual Activity  . Alcohol use: Yes    Comment: He states occassionally, reports last beer was when his brother died.   . Drug use: Yes    Types: Marijuana    Comment: Daily. Last used: yesterday   . Sexual activity: Yes    Comment: uta, patient not fully oriented  Other Topics Concern  . Not on file  Social History Narrative  . Not on file   Social Determinants of Health   Financial Resource Strain:   . Difficulty of Paying Living Expenses:   Food Insecurity:   . Worried About Charity fundraiser in the Last Year:   . Arboriculturist in the Last Year:   Transportation Needs:   . Film/video editor (Medical):   Marland Kitchen Lack of Transportation (Non-Medical):   Physical Activity:   . Days of  Exercise per Week:   . Minutes of Exercise per Session:   Stress:   . Feeling of Stress :   Social Connections:   . Frequency of Communication with Friends and Family:   . Frequency of Social Gatherings with Friends and Family:   . Attends Religious Services:   . Active Member of Clubs or Organizations:   . Attends Banker Meetings:   Marland Kitchen Marital Status:    Additional Social History:      Name of Substance 1: THC 1 - Age of First Use: teens 1 - Amount (size/oz): unknown 1 - Frequency: daily if available 1 - Last Use / Amount: unknown                  Sleep: Good  Appetite:  Good  Current Medications: Current Facility-Administered  Medications  Medication Dose Route Frequency Provider Last Rate Last Admin  . magnesium hydroxide (MILK OF MAGNESIA) suspension 30 mL  30 mL Oral Daily PRN Rankin, Shuvon B, NP      . nicotine (NICODERM CQ - dosed in mg/24 hours) patch 21 mg  21 mg Transdermal Daily Avyan Livesay, Rockey Situ, MD   21 mg at 04/06/20 0736  . OLANZapine (ZYPREXA) tablet 5 mg  5 mg Oral BID Remington Highbaugh, Rockey Situ, MD   5 mg at 04/06/20 0736  . OLANZapine zydis (ZYPREXA) disintegrating tablet 10 mg  10 mg Oral Q8H PRN Rahima Fleishman, Rockey Situ, MD   10 mg at 04/06/20 1251   And  . ziprasidone (GEODON) injection 20 mg  20 mg Intramuscular PRN Aryeh Butterfield, Rockey Situ, MD      . potassium chloride SA (KLOR-CON) CR tablet 20 mEq  20 mEq Oral Daily Morgaine Kimball, Rockey Situ, MD   20 mEq at 04/06/20 0734    Lab Results:  Results for orders placed or performed during the hospital encounter of 04/04/20 (from the past 48 hour(s))  Comprehensive metabolic panel     Status: None   Collection Time: 04/05/20  6:29 PM  Result Value Ref Range   Sodium 139 135 - 145 mmol/L   Potassium 4.0 3.5 - 5.1 mmol/L   Chloride 102 98 - 111 mmol/L   CO2 27 22 - 32 mmol/L   Glucose, Bld 84 70 - 99 mg/dL    Comment: Glucose reference range applies only to samples taken after fasting for at least 8 hours.   BUN 14 6 - 20 mg/dL   Creatinine, Ser 7.51 0.61 - 1.24 mg/dL   Calcium 9.1 8.9 - 70.0 mg/dL   Total Protein 7.8 6.5 - 8.1 g/dL   Albumin 4.6 3.5 - 5.0 g/dL   AST 35 15 - 41 U/L   ALT 41 0 - 44 U/L   Alkaline Phosphatase 50 38 - 126 U/L   Total Bilirubin 0.6 0.3 - 1.2 mg/dL   GFR calc non Af Amer >60 >60 mL/min   GFR calc Af Amer >60 >60 mL/min   Anion gap 10 5 - 15    Comment: Performed at [facility], [address], [place], [place] [address]  Lipid panel     Status: Abnormal   Collection Time: 04/06/20  6:35 AM  Result Value Ref Range   Cholesterol 131 0 - 200 mg/dL   Triglycerides 34 <496 mg/dL   HDL 35 (L) >75 mg/dL   Total CHOL/HDL  Ratio 3.7 RATIO   VLDL 7 0 - 40 mg/dL   LDL Cholesterol 89 0 - 99 mg/dL  Comment:        Total Cholesterol/HDL:CHD Risk Coronary Heart Disease Risk Table                     Men   Women  1/2 Average Risk   3.4   3.3  Average Risk       5.0   4.4  2 X Average Risk   9.6   7.1  3 X Average Risk  23.4   11.0        Use the calculated Patient Ratio above and the CHD Risk Table to determine the patient's CHD Risk.        ATP III CLASSIFICATION (LDL):  <100     mg/dL   Optimal  833-825  mg/dL   Near or Above                    Optimal  130-159  mg/dL   Borderline  053-976  mg/dL   High  >734     mg/dL   Very High Performed at [facility], [address] [place], [place] [address]   Hemoglobin A1c     Status: Abnormal   Collection Time: 04/06/20  6:35 AM  Result Value Ref Range   Hgb A1c MFr Bld 4.6 (L) 4.8 - 5.6 %    Comment: (NOTE) Pre diabetes:          5.7%-6.4% Diabetes:              >6.4% Glycemic control for   <7.0% adults with diabetes    Mean Plasma Glucose 85.32 mg/dL    Comment: Performed at [facility], [address] [place], [place] [address]  TSH     Status: None   Collection Time: 04/06/20  6:35 AM  Result Value Ref Range   TSH 1.387 0.350 - 4.500 uIU/mL    Comment: Performed by a 3rd Generation assay with a functional sensitivity of <=0.01 uIU/mL. Performed at [facility], [address], [place], [place] [address]     Blood Alcohol level:  Lab Results  Component Value Date   ETH 11 (H) 04/02/2020   ETH <10 03/31/2020    Metabolic Disorder Labs: Lab Results  Component Value Date   HGBA1C 4.6 (L) 04/06/2020   MPG 85.32 04/06/2020   No results found for: PROLACTIN Lab Results  Component Value Date   CHOL 131 04/06/2020   TRIG 34 04/06/2020   HDL 35 (L) 04/06/2020   CHOLHDL 3.7 04/06/2020   VLDL 7 04/06/2020   LDLCALC 89 04/06/2020    Physical Findings: AIMS: Facial and Oral  Movements Muscles of Facial Expression: None, normal Lips and Perioral Area: None, normal Jaw: None, normal Tongue: None, normal,Extremity Movements Upper (arms, wrists, hands, fingers): None, normal Lower (legs, knees, ankles, toes): None, normal, Trunk Movements Neck, shoulders, hips: None, normal, Overall Severity Severity of abnormal movements (highest score from questions above): None, normal Incapacitation due to abnormal movements: None, normal Patient's awareness of abnormal movements (rate only patient's report): No Awareness, Dental Status Current problems with teeth and/or dentures?: No Does patient usually wear dentures?: No  CIWA:  CIWA-Ar Total: 0 COWS:     Musculoskeletal: Strength & Muscle Tone: within normal limits Gait & Station: normal Patient leans: N/A  Psychiatric Specialty Exam: Physical Exam  Nursing note and vitals reviewed. Constitutional: He is oriented to person, place, and time. He appears well-developed and well-nourished.  Cardiovascular: Normal  rate.  Respiratory: Effort normal.  Musculoskeletal:        General: Normal range of motion.  Neurological: He is alert and oriented to person, place, and time.  Skin: Skin is warm.    Review of Systems  Constitutional: Negative.   HENT: Negative.   Eyes: Negative.   Respiratory: Negative.   Cardiovascular: Negative.   Gastrointestinal: Negative.   Genitourinary: Negative.   Musculoskeletal: Negative.   Skin: Negative.   Neurological: Negative.   Psychiatric/Behavioral: Negative.     Blood pressure (!) 151/109, pulse 80, temperature 98.7 F (37.1 C), temperature source Oral, resp. rate 18, height 5\' 10"  (1.778 m), weight 85.7 kg, SpO2 100 %.Body mass index is 27.12 kg/m.  General Appearance: Casual and Guarded  Eye Contact:  Fair  Speech:  Clear and Coherent and Normal Rate  Volume:  Normal  Mood:  Euthymic  Affect:  Constricted  Thought Process:  Linear and Descriptions of Associations:  Intact  Orientation:  Full (Time, Place, and Person)  Thought Content:  WDL  Suicidal Thoughts:  No  Homicidal Thoughts:  No  Memory:  Immediate;   Fair Recent;   Poor Remote;   Fair  Judgement:  Fair  Insight:  Fair  Psychomotor Activity:  Normal  Concentration:  Concentration: Fair  Recall:  of Knowledge:  Fair  Language:  Fair  Akathisia:  No  Handed:  Right  AIMS (if indicated):     Assets:  Communication Skills Desire for Improvement Financial Resources/Insurance Housing Physical Health Social Support  ADL's:  Intact  Cognition:  WNL  Sleep:  Number of Hours: 6.25   Assessment: Patient presents in his room and is pleasant, calm, cooperative.  Patient's affect is constricted.  He keeps his answer short and does not give much information.  Based on presentation would assume the patient is minimizing his symptoms and why he is at the hospital. Chart review patient has been compliant with medications and is also been cooperative and attending groups.  Patient is in agreement with continuing his current medications.Patient's BP continues to be elevated at 15/109 with normal HR at 80. Will have vitals rechecked  Treatment Plan Summary: Daily contact with patient to assess and evaluate symptoms and progress in treatment and Medication management Continue Zyprexa 5 mg p.o. twice daily for psychotic features Continue agitation protocol of Zyprexa, Ativan, and Geodon Continue potassium chloride 20 mEq p.o. daily for 2 more doses Encourage group therapy participation Continue every 15 minute safety checks  [employer], FNP 04/06/2020, 3:10 PM   Attest to NP note

## 2020-04-06 NOTE — [facility] Group Notes (Signed)
LCSW Group Note  04/06/2020   Participation Level:  Construction in the dayroom prevented CSW from facilitating group. There is not an alterative group space on the unit.    Naileah Karg C Erika Hussar, LCSWA  04/06/2020 2:57 PM 

## 2020-04-06 NOTE — Progress Notes (Signed)
   04/06/20 0640  Vital Signs  Pulse Rate (!) 45  BP (!) 148/107  BP Location Right Arm  BP Method Automatic  Patient Position (if appropriate) Sitting   Pt BP elevated this morning. Pt denies any symptoms - lightheadedness, headache, dizziness, etc.

## 2020-04-06 NOTE — Progress Notes (Signed)
   04/06/20 2030  Psych Admission Type (Psych Patients Only)  Admission Status Involuntary  Psychosocial Assessment  Patient Complaints Anxiety  Eye Contact Fair  Facial Expression Animated;Anxious;Pensive  Affect Anxious  Speech Logical/coherent  Interaction Assertive  Motor Activity Pacing;Restless  Appearance/Hygiene Unremarkable  Behavior Characteristics Cooperative;Anxious  Mood Anxious;Pleasant  Thought Process  Coherency WDL  Content WDL  Delusions None reported or observed  Perception WDL  Hallucination None reported or observed  Judgment Poor  Confusion None  Danger to Self  Current suicidal ideation? Denies  Danger to Others  Danger to Others None reported or observed   Pt seen in hallway. Dayroom closed down today for repairs and pt restless and irritated by other patients. States that he is using his coping mechanisms (coloring, sitting in room) but not working well. Pt getting angry because he is ready to go. Pt commended for his use of coping skills and given Zyprexa 10 mg to aid in reducing anxiety and agitation.

## 2020-04-07 MED ORDER — AMLODIPINE BESYLATE 5 MG PO TABS: 5.0000 mg | ORAL_TABLET | Freq: Every day | ORAL | 1 refills | Status: DC

## 2020-04-07 MED ORDER — OLANZAPINE 5 MG PO TABS: 5.0000 mg | ORAL_TABLET | Freq: Two times a day (BID) | ORAL | 1 refills | Status: DC

## 2020-04-07 NOTE — [facility]HH Suicide Risk Assessment (Addendum)
[facility] Discharge Suicide Risk Assessment   Principal Problem: Psychotic disorder [facility]) Discharge Diagnoses: Principal Problem:   Psychotic disorder ([facility])   Total Time spent with patient: 30 minutes  Musculoskeletal: Strength & Muscle Tone: within normal limits Gait & Station: normal Patient leans: N/A  Psychiatric Specialty Exam: Review of Systems no headache, no visual disturbances, no chest pain, no shortness of breath, no nausea or vomiting, no rash   Blood pressure (!) 160/98, pulse 76, temperature 97.8 F (36.6 C), temperature source Oral, resp. rate 18, height 5\' 10"  (1.778 m), weight 85.7 kg, SpO2 100 %.Body mass index is 27.12 kg/m.  General Appearance: Well Groomed  Eye Contact::  Good  Speech:  Normal Rate409  Volume:  Normal  Mood:  reports he is feeling better than on admission.  Affect:  Appropriate and Full Range  Thought Process:  Linear and Descriptions of Associations: Intact  Orientation:  Other:  fully alert and attentive, oriented x 3   Thought Content:  no hallucinations, no delusions, not internally preoccupied   Suicidal Thoughts:  No denies suicidal or self injurious ideations, denies homicidal or violent ideations  Homicidal Thoughts:  No  Memory:  recent and remote grossly intact   Judgement:  Other:  improving   Insight:  fair- improving  Psychomotor Activity:  Normal  Concentration:  Good  Recall:  Good  Fund of Knowledge:Good  Language: Good  Akathisia:  Negative  Handed:  Right  AIMS (if indicated):   no abnormal movements noted or reported   Assets:  Communication Skills Desire for Improvement Resilience  Sleep:  Number of Hours: 5.25  Cognition: WNL  ADL's:  Intact   Mental Status Per Nursing Assessment::   On Admission:  NA  Demographic Factors:  18, single, was living with mother prior to admission.   Loss Factors: Does not endorse specific triggers or stressors . Cannabis Use   Historical Factors: History of prior psychiatric  admissions. In the past has been diagnosed with Cannabis Use Disorder.   Risk Reduction Factors:   Sense of responsibility to family, Living with another person, especially a relative and Positive coping skills or problem solving skills  Continued Clinical Symptoms:  At this time patient is alert, attentive, calm, pleasant on approach. Mood improved and currently presents euthymic, affect is appropriate, not irritable or agitated at this time. No thought disorder. Denies suicidal or self injurious ideations, denies homicidal or violent ideations. No hallucinations, does not appear internally preoccupied, no delusions or persecutory ideations currently expressed . Oriented x 3.  Staff reports patient's behavior on unit has remained calm, without disruptive or agitated behaviors. Noted to be in day room , interacting appropriately with peer.  Denies medication side effects. Side effects reviewed. * with his express consent I spoke with his sister , [phone]. She states that he appears improved and currently stable, at baseline. She confirms that he is going to be living with her . She is hoping that he will continue to take his medications as prescribed because she feels he " is better when he takes the medication".  * BP has trended high and patient was started on an antihypertensive yesterday . Denies side effects. Denies any associated symptoms- no headache, no visual disturbances, no chest pain or Shortness of Breath. Referred to PCP for ongoing management .   Cognitive Features That Contribute To Risk:  No gross cognitive deficits noted upon discharge. Is alert , attentive, and oriented x 3   Suicide Risk:  Mild:  Suicidal ideation of limited frequency, intensity, duration, and specificity.  There are no identifiable plans, no associated intent, mild dysphoria and related symptoms, good self-control (both objective and subjective assessment), few other risk factors, and identifiable protective  factors, including available and accessible social support.  Follow-up Information    [facility] Follow up.   Why: Referral information was faxed on your behalf and clinician left a message to schedule an appointment. Please call to follow up, or use walk in hours (Monday- Friday, 8:00am until 3:00pm).  Contact information: [address] [place] [address] [phone]           Plan Of Care/Follow-up recommendations:  Activity:  as tolerated Diet:  heart healthy Tests:  NA  Other:  See below  Currently there are no further grounds for involuntary commitment and patient is leaving unit in good spirits . Plans to follow up as above. Referred to [facility] for medical management as needed   Jenne Campus, MD 04/07/2020, 11:07 AM

## 2020-04-07 NOTE — Progress Notes (Signed)
  [facility] Adult Case Management Discharge Plan :  Will you be returning to the same living situation after discharge:  No. Staying with his sister. At discharge, do you have transportation home?: Yes,  sister will pick up. Do you have the ability to pay for your medications: Yes,  has Medicaid.  Release of information consent forms completed and in the chart.  Patient to Follow up at: Follow-up Information    [facility] Follow up.   Why: Referral information was faxed on your behalf and clinician left a message to schedule an appointment. Please call to follow up, or use walk in hours (Monday- Friday, 8:00am until 3:00pm).  Contact information: [address] [place] [address] [phone]           Next level of care provider has access to [facility] Link:no  Safety Planning and Suicide Prevention discussed: Yes,  with sister.  Has patient been referred to the Quitline?: N/A patient is not a smoker  Patient has been referred for addiction treatment: Yes  Darreld Mclean, LCSWA 04/07/2020, 11:12 AM

## 2020-04-07 NOTE — Progress Notes (Signed)
   04/07/20 0608  Vital Signs  Temp 97.8 F (36.6 C)  Temp Source Oral  Pulse Rate (!) 52  BP (!) 166/107  BP Location Right Arm  BP Method Automatic  Patient Position (if appropriate) Sitting   Pt BP elevated this morning. Given PRN clonidine 0.1 mg. Will continue to monitor.

## 2020-04-07 NOTE — Plan of Care (Signed)
Pt attended all recreation therapy group sessions in a calm and appropriate mood.   Caroll Rancher, LRT/CTRS

## 2020-04-07 NOTE — Progress Notes (Signed)
Recreation Therapy Notes  INPATIENT RECREATION TR PLAN  Patient Details Name: Gabriel Nelson MRN: [id number] DOB: 12/31/2000 Today's Date: 04/07/2020  Rec Therapy Plan Is patient appropriate for Therapeutic Recreation?: Yes Treatment times per week: about 3 days Estimated Length of Stay: 5-7 days TR Treatment/Interventions: Group participation (Comment)  Discharge Criteria Pt will be discharged from therapy if:: Discharged Treatment plan/goals/alternatives discussed and agreed upon by:: Patient/family  Discharge Summary Short term goals set: See patient care plan Short term goals met: Complete Progress toward goals comments: Groups attended Which groups?: Coping skills, Leisure education, Other (Comment)(Team Building) Reason goals not met: None Therapeutic equipment acquired: N/A Reason patient discharged from therapy: Discharge from hospital Pt/family agrees with progress & goals achieved: Yes Date patient discharged from therapy: 04/07/20    Victorino Sparrow, LRT/CTRS  Ria Comment, [place] 04/07/2020, 11:26 AM

## 2020-04-07 NOTE — Progress Notes (Signed)
Recreation Therapy Notes  Date: 5.13.21 Time: 1000 Location: [address]  Group Topic: Coping Skills  Goal Area(s) Addresses:  Patient will identify positive coping skills. Patient will identify benefit of using coping skills post d/c.  Behavioral Response: Engaged  Intervention: Game  Activity: Chartered loss adjuster.  Patients would play game according to regular rules.  In addition to identifying a coping skill for each piece they move.  Patients could not repeat any coping skill that had been named.  If the [place] tower fell over, the game would start over.  Education: Pharmacologist, Building control surveyor.   Education Outcome: Acknowledges understanding/In group clarification offered/Needs additional education.   Clinical Observations/Feedback: Pt was engaged and excited about discharging.  Pt was able to identify some of his coping skills as coloring and listening to music.    Caroll Rancher, LRT/CTRS    Lillia Abed, Stokely Jeancharles A 04/07/2020 11:06 AM

## 2020-04-07 NOTE — Plan of Care (Signed)
Discharge note  Patient verbalizes readiness for discharge. Follow up plan explained, AVS, Transition record and SRA given. Prescriptions and teaching provided. Belongings returned and signed for. Suicide safety plan completed and signed. Patient verbalizes understanding. Patient denies SI/HI and assures this Clinical research associate they will seek assistance should that change. Patient discharged to lobby where girlfriend was waiting.   Problem: Education: Goal: Knowledge of [facility] Education information/materials will improve Outcome: Adequate for Discharge Goal: Emotional status will improve Outcome: Adequate for Discharge Goal: Mental status will improve Outcome: Adequate for Discharge Goal: Verbalization of understanding the information provided will improve Outcome: Adequate for Discharge   Problem: Activity: Goal: Interest or engagement in activities will improve Outcome: Adequate for Discharge Goal: Sleeping patterns will improve Outcome: Adequate for Discharge   Problem: Coping: Goal: Ability to verbalize frustrations and anger appropriately will improve Outcome: Adequate for Discharge Goal: Ability to demonstrate self-control will improve Outcome: Adequate for Discharge   Problem: Health Behavior/Discharge Planning: Goal: Identification of resources available to assist in meeting health care needs will improve Outcome: Adequate for Discharge Goal: Compliance with treatment plan for underlying cause of condition will improve Outcome: Adequate for Discharge   Problem: Physical Regulation: Goal: Ability to maintain clinical measurements within normal limits will improve Outcome: Adequate for Discharge   Problem: Safety: Goal: Periods of time without injury will increase Outcome: Adequate for Discharge   Problem: Activity: Goal: Will verbalize the importance of balancing activity with adequate rest periods Outcome: Adequate for Discharge   Problem: Education: Goal: Will  be free of psychotic symptoms Outcome: Adequate for Discharge Goal: Knowledge of the prescribed therapeutic regimen will improve Outcome: Adequate for Discharge   Problem: Coping: Goal: Coping ability will improve Outcome: Adequate for Discharge Goal: Will verbalize feelings Outcome: Adequate for Discharge   Problem: Health Behavior/Discharge Planning: Goal: Compliance with prescribed medication regimen will improve Outcome: Adequate for Discharge   Problem: Nutritional: Goal: Ability to achieve adequate nutritional intake will improve Outcome: Adequate for Discharge   Problem: Role Relationship: Goal: Ability to communicate needs accurately will improve Outcome: Adequate for Discharge Goal: Ability to interact with others will improve Outcome: Adequate for Discharge   Problem: Safety: Goal: Ability to redirect hostility and anger into socially appropriate behaviors will improve Outcome: Adequate for Discharge Goal: Ability to remain free from injury will improve Outcome: Adequate for Discharge   Problem: Self-Care: Goal: Ability to participate in self-care as condition permits will improve Outcome: Adequate for Discharge   Problem: Self-Concept: Goal: Will verbalize positive feelings about self Outcome: Adequate for Discharge   Problem: Education: Goal: Knowledge of disease or condition will improve Outcome: Adequate for Discharge Goal: Understanding of discharge needs will improve Outcome: Adequate for Discharge   Problem: Health Behavior/Discharge Planning: Goal: Ability to identify changes in lifestyle to reduce recurrence of condition will improve Outcome: Adequate for Discharge Goal: Identification of resources available to assist in meeting health care needs will improve Outcome: Adequate for Discharge   Problem: Physical Regulation: Goal: Complications related to the disease process, condition or treatment will be avoided or minimized Outcome: Adequate for  Discharge   Problem: Safety: Goal: Ability to remain free from injury will improve Outcome: Adequate for Discharge
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
## 2020-04-07 NOTE — [facility] Suicide Risk Assessment (Signed)
[facility] INPATIENT:  Family/Significant Other Suicide Prevention Education  Suicide Prevention Education:  Education Completed; sister Gabriel Nelson [phone] has been identified by the patient as the family member/significant other with whom the patient will be residing, and identified as the person(s) who will aid the patient in the event of a mental health crisis (suicidal ideations/suicide attempt).  With written consent from the patient, the family member/significant other has been provided the following suicide prevention education, prior to the and/or following the discharge of the patient.  The suicide prevention education provided includes the following:  Suicide risk factors  Suicide prevention and interventions  National Suicide Hotline telephone number  [facility] assessment telephone number  [facility] Emergency Assistance 911  [facility] and/or Residential Mobile Crisis Unit telephone number  Request made of family/significant other to:  Remove weapons (e.g., guns, rifles, knives), all items previously/currently identified as safety concern.    Remove drugs/medications (over-the-counter, prescriptions, illicit drugs), all items previously/currently identified as a safety concern.  The family member/significant other verbalizes understanding of the suicide prevention education information provided.  The family member/significant other agrees to remove the items of safety concern listed above.   Sister expressed concerns regarding medication compliance and patient staying with her, as she has a minor child in the home. Sister was relieved to hear that patient agreed to outpatient follow up and sounded "better" on the phone. Sister expressed no safety concerns for discharge other than medication compliance.  Sister requested to be transferred to speak with patient. CSW provided call back information, should additional questions or concerns arise prior to  discharge.  Gabriel Nelson 04/07/2020, 10:24 AM

## 2020-04-27 ENCOUNTER — Ambulatory Visit: Payer: Medicaid Other | Admitting: Family Medicine

## 2020-10-01 ENCOUNTER — Encounter (HOSPITAL_COMMUNITY): Payer: Self-pay | Admitting: Emergency Medicine

## 2020-10-01 ENCOUNTER — Other Ambulatory Visit: Payer: Self-pay

## 2020-10-01 ENCOUNTER — Emergency Department (HOSPITAL_COMMUNITY)
Admission: EM | Admit: 2020-10-01 | Discharge: 2020-10-02 | Disposition: A | Discharge status: Home / Self Care | Payer: Medicaid Other | Attending: Emergency Medicine | Admitting: Emergency Medicine

## 2020-10-01 DIAGNOSIS — Z20822 Contact with and (suspected) exposure to covid-19: Secondary | ICD-10-CM | POA: Diagnosis not present

## 2020-10-01 DIAGNOSIS — F1290 Cannabis use, unspecified, uncomplicated: Secondary | ICD-10-CM | POA: Diagnosis not present

## 2020-10-01 DIAGNOSIS — F918 Other conduct disorders: Secondary | ICD-10-CM | POA: Insufficient documentation

## 2020-10-01 DIAGNOSIS — F29 Unspecified psychosis not due to a substance or known physiological condition: Secondary | ICD-10-CM | POA: Diagnosis not present

## 2020-10-01 DIAGNOSIS — F17210 Nicotine dependence, cigarettes, uncomplicated: Secondary | ICD-10-CM | POA: Diagnosis not present

## 2020-10-01 DIAGNOSIS — F17290 Nicotine dependence, other tobacco product, uncomplicated: Secondary | ICD-10-CM | POA: Insufficient documentation

## 2020-10-01 DIAGNOSIS — R451 Restlessness and agitation: Secondary | ICD-10-CM | POA: Insufficient documentation

## 2020-10-01 DIAGNOSIS — Z79899 Other long term (current) drug therapy: Secondary | ICD-10-CM | POA: Insufficient documentation

## 2020-10-01 DIAGNOSIS — R4689 Other symptoms and signs involving appearance and behavior: Secondary | ICD-10-CM

## 2020-10-01 DIAGNOSIS — F12951 Cannabis use, unspecified with psychotic disorder with hallucinations: Secondary | ICD-10-CM

## 2020-10-01 HISTORY — DX: Cannabis use, unspecified with psychotic disorder with hallucinations: F12.951

## 2020-10-01 LAB — RAPID URINE DRUG SCREEN, HOSP PERFORMED
Amphetamines: NOT DETECTED
Barbiturates: NOT DETECTED
Benzodiazepines: NOT DETECTED
Cocaine: NOT DETECTED
Opiates: NOT DETECTED
Tetrahydrocannabinol: POSITIVE — AB

## 2020-10-01 LAB — COMPREHENSIVE METABOLIC PANEL
ALT: 23 U/L (ref 0–44)
AST: 26 U/L (ref 15–41)
Albumin: 4.8 g/dL (ref 3.5–5.0)
Alkaline Phosphatase: 50 U/L (ref 38–126)
Anion gap: 14 (ref 5–15)
BUN: 19 mg/dL (ref 6–20)
CO2: 23 mmol/L (ref 22–32)
Calcium: 9.3 mg/dL (ref 8.9–10.3)
Chloride: 101 mmol/L (ref 98–111)
Creatinine, Ser: 1.21 mg/dL (ref 0.61–1.24)
GFR, Estimated: >60 mL/min (ref >60)
Glucose, Bld: 119 mg/dL — ABNORMAL HIGH (ref 70–99)
Potassium: 3.4 mmol/L — ABNORMAL LOW (ref 3.5–5.1)
Sodium: 138 mmol/L (ref 135–145)
Total Bilirubin: 0.8 mg/dL (ref 0.3–1.2)
Total Protein: 8.2 g/dL — ABNORMAL HIGH (ref 6.5–8.1)

## 2020-10-01 LAB — CBC WITH DIFFERENTIAL/PLATELET
Abs Immature Granulocytes: 0.02 10*3/uL (ref 0.00–0.07)
Basophils Absolute: 0 10*3/uL (ref 0.0–0.1)
Basophils Relative: 0 %
Eosinophils Absolute: 0 10*3/uL (ref 0.0–0.5)
Eosinophils Relative: 0 %
HCT: 45.1 % (ref 39.0–52.0)
Hemoglobin: 14.7 g/dL (ref 13.0–17.0)
Immature Granulocytes: 0 %
Lymphocytes Relative: 14 %
Lymphs Abs: 1.4 10*3/uL (ref 0.7–4.0)
MCH: 29 pg (ref 26.0–34.0)
MCHC: 32.6 g/dL (ref 30.0–36.0)
MCV: 89 fL (ref 80.0–100.0)
Monocytes Absolute: 0.8 10*3/uL (ref 0.1–1.0)
Monocytes Relative: 8 %
Neutro Abs: 8.1 10*3/uL — ABNORMAL HIGH (ref 1.7–7.7)
Neutrophils Relative %: 78 %
Platelets: 173 10*3/uL (ref 150–400)
RBC: 5.07 MIL/uL (ref 4.22–5.81)
RDW: 12.2 % (ref 11.5–15.5)
WBC: 10.4 10*3/uL (ref 4.0–10.5)
nRBC: 0 % (ref 0.0–0.2)

## 2020-10-01 LAB — RESPIRATORY PANEL BY RT PCR (FLU A&B, COVID)
Influenza A by PCR: NEGATIVE
Influenza B by PCR: NEGATIVE
SARS Coronavirus 2 by RT PCR: NEGATIVE

## 2020-10-01 LAB — ETHANOL: Alcohol, Ethyl (B): <10 mg/dL (ref <10)

## 2020-10-01 MED ORDER — NICOTINE 7 MG/24HR TD PT24
7.0000 mg | MEDICATED_PATCH | Freq: Once | TRANSDERMAL | Status: AC
  Administered 2020-10-01: 7 mg via TRANSDERMAL
  Filled 2020-10-01: qty 1

## 2020-10-01 MED ORDER — OLANZAPINE 5 MG PO TBDP: 5.0000 mg | ORAL_TABLET | Freq: Every day | ORAL | Status: DC

## 2020-10-01 MED ORDER — OLANZAPINE 5 MG PO TBDP
5.0000 mg | ORAL_TABLET | Freq: Every day | ORAL | Status: DC
  Administered 2020-10-01 – 2020-10-02 (×2): 5 mg via ORAL
  Filled 2020-10-01 (×2): qty 1

## 2020-10-01 MED ORDER — HYDROXYZINE HCL 25 MG PO TABS
25.0000 mg | ORAL_TABLET | Freq: Three times a day (TID) | ORAL | Status: DC | PRN
  Administered 2020-10-01 (×2): 25 mg via ORAL
  Filled 2020-10-01 (×2): qty 1

## 2020-10-01 NOTE — ED Notes (Signed)
Per TTS, pt will be observed overnight and evaluated by psychiatry in the morning.

## 2020-10-01 NOTE — ED Notes (Signed)
Pt was able to follow commands to stand to provide urine sample. Pt asking for something eat. Pt is apologetic, stating he does not know what is going on.  Pt provided with Ham sandwich and sprite.

## 2020-10-01 NOTE — ED Notes (Signed)
Pt continuing to ask for mom. Pt requesting to take a quick smoke break. Pt continuing to repeat himself and saying "um." As soon as staff leave the room, pt starts asking for someone to come in and talk with him.

## 2020-10-01 NOTE — ED Notes (Signed)
Pt is confused. Cannot form a clear sentence and does not make a lot of sense when trying to complete one. Pt keeps repeating himself and saying "um". When a nurse leaves the room he asks one of [place] to come back to help him. When asked what he needs, pt is unsure.

## 2020-10-01 NOTE — ED Notes (Signed)
Clothing placed in locker 27 in [employer]

## 2020-10-01 NOTE — Consult Note (Addendum)
  Gabriel Nelson, 19 y.o., male patient seen via tele psych by this provider, consulted with Dr. Jannifer Franklin; and chart reviewed on 10/01/20.  On evaluation Gabriel Nelson is unable to tell what happened that cause him to be brought to the hospital.  Patient continues to present with disorientation, but oriented to name.  Thought blocking, and anxious. Patient able to say yes to being employed, yes that he lives with his mother; and yes to paranoia but unable to give description to paranoia.  When asked if he was responding to auditory or visual hallucinations, patient would not answer but looking around room.     Collateral information from mother informing that patient has had previous episodes similar to this presentation that was a result of marijuana use.    [facility] Started Zyprexa Zydis 5 mg daily for psychosis and Vistaril 25 mg Tid for anxiety   Disposition: Continuous monitoring related acuity of condition.   Patient good candite for transfer to [facility] to the continuous assessment unit.        Spoke to Dr. Rodena Medin (EDP) informed patient accepted to [facility].    Patient seen via tele health for psychiatric evaluation, chart reviewed and case discussed with the physician extender and developed treatment plan. Reviewed the information documented and agree with the treatment plan. Thedore Mins, MD

## 2020-10-01 NOTE — ED Notes (Signed)
Save blue, gold(2x), red all in main lab

## 2020-10-01 NOTE — ED Triage Notes (Signed)
Pt BIB EMS from home. Mother called EMS stating the pt was having a "behavior episode" that began ~40 minutes ago. Pt is agitated and will not speak. Pt has hx of mental health issues. [employer] at bedside.

## 2020-10-01 NOTE — ED Notes (Signed)
Pt requesting to speak to mom, staff dialed number and pt spoke with mom on the phone.

## 2020-10-01 NOTE — ED Notes (Signed)
Pt alert x 4 denies auditory / visual hallucinations, flat affect, cooperative, is able to state why he is here.Currently watching t.v. He is able to state his needs, expresses to Clinical research associate that he is feeling anxious and would like to have something to help with this Vistaril 25 mg po given at this time. I will continue to monitor and support.

## 2020-10-01 NOTE — ED Notes (Signed)
Pt has continued to be disorganized with thought blocking all day. He will call out, "nurse" over and over but cannot complete a sentence to tell us what he needs. He has been cooperative.

## 2020-10-01 NOTE — ED Notes (Signed)
PT mother request call [phone]

## 2020-10-01 NOTE — BH Assessment (Signed)
Tele Assessment Note   Patient Name: Gabriel Nelson MRN: [id number] Referring Physician: Ross Marcus, MD Location of Patient: [facility] ED, [phone] Location of Provider: Behavioral Health TTS Department  Gabriel Nelson is an 19 y.o. single male who presents unaccompanied to [facility] ED via EMS accompanied by his mother, Gabriel Nelson [phone], who participated in assessment. Pt's medical record indicates Pt has a history of psychotic episodes, sometimes associated with marijuana use. Pt does not respond to any questions and make brief intermittent eye contact. Pt's mother says approximately 4 hours ago Pt began shaking and then stopped talking and would not respond to questions. She says she suspects Pt may have used marijuana because he had a similar episode in 2016. She says she also suspects Pt is not taking prescribed medications. She reports he was admitted to [facility] in May 2021 for psychotic symptoms. She says he has been functional normally until today and going to work at his job at [employer]. She says he has not made any threats to harm himself or anyone else recently. She cannot identify any unusual stressors. Urine drug screen is in process.  Diagnosis: Unspecified Psychotic Disorder  Past Medical History:  Past Medical History:  Diagnosis Date  . ADHD   . Medical history non-contributory     Past Surgical History:  Procedure Laterality Date  . NO PAST SURGERIES      Family History:  Family History  Problem Relation Age of Onset  . Healthy Mother     Social History:  reports that he has been smoking cigarettes and cigars. He has a 4.00 pack-year smoking history. He has never used smokeless tobacco. He reports current alcohol use. He reports current drug use. Drug: Marijuana.  Additional Social History:  Alcohol / Drug Use Pain Medications: See MAR Prescriptions: See MAR Over the Counter: See MAR History of alcohol / drug use?: Yes Longest period of  sobriety (when/how long): Unknown Substance #1 Name of Substance 1: Marijuana 1 - Age of First Use: unknown 1 - Amount (size/oz): unknown 1 - Frequency: unknown 1 - Duration: unknown 1 - Last Use / Amount: unknown  CIWA: CIWA-Ar BP: (!) 163/93 Pulse Rate: (!) 108 COWS:    Allergies: No Known Allergies  Home Medications: (Not in a hospital admission)   OB/GYN Status:  No LMP for male patient.  General Assessment Data Location of Assessment: [facility] ED TTS Assessment: In system Is this a Tele or Face-to-Face Assessment?: Tele Assessment Is this an Initial Assessment or a Re-assessment for this encounter?: Initial Assessment Patient Accompanied by:: Parent Language Other than English: No Living Arrangements: Other (Comment) (Lives with mother) What gender do you identify as?: Male Date Telepsych consult ordered in [facility]: 10/01/20 Time Telepsych consult ordered in [facility]: 0253 Marital status: Single Maiden name: NA Pregnancy Status: No Living Arrangements: Parent Can pt return to current living arrangement?: Yes Admission Status: Voluntary Is patient capable of signing voluntary admission?: Yes Referral Source: Self/Family/Friend Insurance type: Medicaid     Crisis Care Plan Living Arrangements: Parent Legal Guardian: Other: (Self) Name of Psychiatrist: [facility] Name of Therapist: None  Education Status Is patient currently in school?: No Is the patient employed, 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 :  (Unable to assess: Pt unresponsive) Suicidal Intent:  (Unable to  assess: Pt unresponsive) Has patient had any suicidal intent within the past 6 months prior to admission? :  (Unable to assess: Pt unresponsive) Is patient at risk for suicide?:  (Unable to assess: Pt  unresponsive) Suicidal Plan?:  (Unable to assess: Pt unresponsive) Has patient had any suicidal plan within the past 6 months prior to admission? :  (Unable to assess: Pt unresponsive) Access to Means: No What has been your use of drugs/alcohol within the last 12 months?: Pt has history of marijuana use Previous Attempts/Gestures: No How many times?: 0 Other Self Harm Risks: None Triggers for Past Attempts: None known Intentional Self Injurious Behavior: None Family Suicide History: Unknown Recent stressful life event(s): Other (Comment) (Pt does not respond) Persecutory voices/beliefs?: No Depression:  (Unable to assess: Pt unresponsive) Depression Symptoms:  (Unable to assess: Pt unresponsive) Substance abuse history and/or treatment for substance abuse?: Yes Suicide prevention information given to non-admitted patients: Not applicable  Risk to Others within the past 6 months Homicidal Ideation: No Does patient have any lifetime risk of violence toward others beyond the six months prior to admission? : Yes (comment) Thoughts of Harm to Others:  (Unable to assess: Pt unresponsive) Current Homicidal Intent:  (Unable to assess: Pt unresponsive) Current Homicidal Plan:  (Unable to assess: Pt unresponsive) Access to Homicidal Means: No Identified Victim: Unable to assess: Pt unresponsive History of harm to others?: No Assessment of Violence: In past 6-12 months Violent Behavior Description: Pt has a history of aggressive behavior Does patient have access to weapons?: No Criminal Charges Pending?: No Does patient have a court date: No Is patient on probation?: No  Psychosis Hallucinations:  (Unable to assess: Pt unresponsive) Delusions:  (Unable to assess: Pt unresponsive)  Mental Status Report Appearance/Hygiene: In scrubs Eye Contact: Poor Motor Activity: Freedom of movement Speech: Elective mutism Level of Consciousness: Unable to assess Mood: Other (Comment) (Unable to  assess: Pt unresponsive) Affect: Unable to Assess Anxiety Level:  (Unable to assess: Pt unresponsive) Thought Processes: Unable to Assess Judgement: Unable to Assess Orientation: Unable to assess Obsessive Compulsive Thoughts/Behaviors: Unable to Assess  Cognitive Functioning Concentration: Unable to Assess Memory: Unable to Assess Is patient IDD: No Insight: Unable to Assess Impulse Control: Unable to Assess Appetite: Good Have you had any weight changes? : No Change Sleep: Unable to Assess Total Hours of Sleep:  (Unable to assess: Pt unresponsive) Vegetative Symptoms: Unable to Assess  ADLScreening [facility] Assessment Services) Patient's cognitive ability adequate to safely complete daily activities?: Yes Patient able to express need for assistance with ADLs?: Yes Independently performs ADLs?: Yes (appropriate for developmental age)  Prior Inpatient Therapy Prior Inpatient Therapy: Yes Prior Therapy Dates: 03/2020, Multiple admits Prior Therapy Facilty/Provider(s): [facility] Reason for Treatment: Psychosis  Prior Outpatient Therapy Prior Outpatient Therapy: Yes Prior Therapy Dates: 2021 Prior Therapy Facilty/Provider(s): [facility] Reason for Treatment: MDD Does patient have an ACCT team?: No Does patient have Intensive In-House Services?  : No Does patient have [facility] services? : Yes Does patient have P4CC services?: No  ADL Screening (condition at time of admission) Patient's cognitive ability adequate to safely complete daily activities?: Yes Is the patient deaf or have difficulty hearing?: No Does the patient have difficulty seeing, even when wearing glasses/contacts?: No Does the patient have difficulty concentrating, remembering, or making decisions?: Yes Patient able to express need for assistance with ADLs?: Yes Does the patient have difficulty dressing or bathing?: No Independently performs ADLs?: Yes (appropriate for developmental age) Does the patient have  difficulty walking or climbing stairs?: No Weakness of Legs: None Weakness of Arms/Hands: None  Home Assistive Devices/Equipment Home Assistive Devices/Equipment: None    Abuse/Neglect Assessment (Assessment to be complete while patient is alone) Abuse/Neglect Assessment Can Be Completed: Unable to assess, patient is non-responsive or altered mental status     Advance Directives (For Healthcare) Does Patient Have a Medical Advance Directive?: Unable to assess, patient is non-responsive or altered mental status Would patient like information on creating a medical advance directive?: No - Patient declined          Disposition: Gave clinical report to Gillermo Murdoch, NP who recommended Pt be observed overnight in ED and evaluated by psychiatry in the morning. Notified Dr. Ross Marcus and Candis Shine, RN of recommendation.  Disposition Initial Assessment Completed for this Encounter: Yes  This service was provided via telemedicine using a 2-way, interactive audio and video technology.  Names of all persons participating in this telemedicine service and their role in this encounter. Name: Gabriel Nelson Role: Patient  Name: Gabriel Nelson Role: Pt's mother  Name: Shela Commons, [facility] Role: TTS counselor      Harlin Rain Patsy Baltimore, [facility], [facility] Triage Specialist [phone]  Pamalee Leyden 10/01/2020 3:54 AM

## 2020-10-01 NOTE — ED Provider Notes (Signed)
Emergency Medicine Observation Re-evaluation Note  Gabriel Nelson is a 19 y.o. male, seen on rounds today.  Pt initially presented to the ED for complaints of Aggressive Behavior Currently, the patient is cooperative, directable.  Physical Exam  BP 140/85   Pulse 99   Temp 97.8 F (36.6 C) (Oral)   Resp 16   SpO2 98%  Physical Exam General: NAD Cardiac: RRR Lungs: CTAB Psych: Calm, directable  ED Course / MDM  EKG:EKG Interpretation  Date/Time:  Saturday October 01 2020 03:15:09 EDT Ventricular Rate:  113 PR Interval:    QRS Duration: 92 QT Interval:  318 QTC Calculation: 436 R Axis:   90 Text Interpretation: Sinus tachycardia Consider right atrial enlargement Borderline right axis deviation ST elev, probable normal early repol pattern Confirmed by Ross Marcus ([id number]) on 10/01/2020 4:50:42 AM  Clinical Course as of Oct 02 835  Sat Oct 01, 2020  0252 Spoke with mother.  She reports that he has a history of psychosis.  Often times he is agitated during these episodes but he has had several episodes where he "just stares off and says nothing."  She reports that he does not take his medications regularly.  She reports that she picked him up from work yesterday evening and he seemed fine but then began to talk about stressful things at work.  Ultimately he stopped talking and just started staring around and not interacting.   [CH]  [phone] Mother reports that often his psychosis is provoked by marijuana use.   [CH]    Clinical Course User Index [CH] Horton, Mayer Masker, MD   I have reviewed the labs performed to date as well as medications administered while in observation.  No recent changes in the last 24 hours.  Plan  Current plan is for Psych Re-evaluation. Patient is not under full IVC at this time.   Wynetta Fines, MD 10/01/20 [phone]

## 2020-10-01 NOTE — ED Notes (Signed)
Pt taking in unclear sentences. Pt asking for a cigarette and his mother. Pt counting out loud. Pt states he wants to watch TV. Pt is able to state his first name.

## 2020-10-01 NOTE — ED Notes (Signed)
Pt moved to hallway. Pt states he does not like being in the room by himself. Pt continues to ask for help, however when asked how we can help him pt states "um."

## 2020-10-01 NOTE — ED Provider Notes (Signed)
[facility]-EMERGENCY DEPT Provider Note   CSN: [id number] Arrival date & time: 10/01/20  0215     History Chief Complaint  Patient presents with  . Aggressive Behavior    Gabriel Nelson is a 19 y.o. male.  HPI     This is a 19 year old male with a history of ADHD, psychosis, adjustment disorder who presents with a "behavior episode."  Per EMS, mother called them for a behavior episode.  He has a history of psychosis and was reportedly agitated at home.  Vital signs were stable in route.  Patient will not provide any history.  He does not speak.  He occasionally makes eye contact and appears to laugh inappropriately.  Level 5 caveat.  Past Medical History:  Diagnosis Date  . ADHD   . Medical history non-contributory     Patient Active Problem List   Diagnosis Date Noted  . Psychotic disorder (HCC) 04/04/2020  . Psychosis (HCC)   . Moderate cannabis use disorder (HCC) 08/08/2018  . 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 Rhabdomyolysis 10/27/2016  . Marijuana intoxication (HCC) 10/27/2016  . Osteomyelitis (HCC) 10/23/2015    Past Surgical History:  Procedure Laterality Date  . NO PAST SURGERIES         Family History  Problem Relation Age of Onset  . Healthy Mother     Social History   Tobacco Use  . Smoking status: Current Every Day Smoker    Packs/day: 1.00    Years: 4.00    Pack years: 4.00    Types: Cigarettes, Cigars  . Smokeless tobacco: Never Used  Vaping Use  . Vaping Use: Never used  Substance Use Topics  . Alcohol use: Yes    Comment: He states occassionally, reports last beer was when his brother died.   . Drug use: Yes    Types: Marijuana    Comment: Daily. Last used: yesterday     Home Medications Prior to Admission medications   Medication Sig Start Date End Date Taking? Authorizing Provider  amLODipine (NORVASC) 5 MG tablet Take 1 tablet (5 mg total) by mouth  daily. Patient not taking: Reported on 10/01/2020 04/08/20   Money, Gerlene Burdock, FNP  OLANZapine (ZYPREXA) 5 MG tablet Take 1 tablet (5 mg total) by mouth 2 (two) times daily. Patient not taking: Reported on 10/01/2020 04/07/20   Money, Gerlene Burdock, FNP    Allergies    Patient has no known allergies.  Review of Systems   Review of Systems  Unable to perform ROS: Psychiatric disorder    Physical Exam Updated Vital Signs BP (!) 146/85   Pulse (!) 108   Temp 97.8 F (36.6 C) (Oral)   Resp (!) 22   SpO2 96%   Physical Exam Vitals and nursing note reviewed.  Constitutional:      Appearance: He is well-developed. He is not ill-appearing.  HENT:     Head: Normocephalic and atraumatic.     Nose: Nose normal.     Mouth/Throat:     Mouth: Mucous membranes are moist.  Eyes:     Pupils: Pupils are equal, round, and reactive to light.  Cardiovascular:     Rate and Rhythm: Regular rhythm. Tachycardia present.  Pulmonary:     Effort: Pulmonary effort is normal. No respiratory distress.  Abdominal:     Palpations: Abdomen is soft.  Musculoskeletal:     Cervical back: Neck supple.     Right lower leg: No  edema.     Left lower leg: No edema.  Lymphadenopathy:     Cervical: No cervical adenopathy.  Skin:    General: Skin is warm and dry.  Neurological:     Mental Status: He is alert.     Comments: Unable to assess orientation as patient will not speak, moves all 4 extremities spontaneously, is awake and alert  Psychiatric:     Comments: Abnormal behavior, unable to assess mood or thought content     ED Results / Procedures / Treatments   Labs (all labs ordered are listed, but only abnormal results are displayed) Labs Reviewed  CBC WITH DIFFERENTIAL/PLATELET - Abnormal; Notable for the following components:      Result Value   Neutro Abs 8.1 (*)    All other components within normal limits  COMPREHENSIVE METABOLIC PANEL - Abnormal; Notable for the following components:   Potassium  3.4 (*)    Glucose, Bld 119 (*)    Total Protein 8.2 (*)    All other components within normal limits  RESPIRATORY PANEL BY RT PCR (FLU A&B, COVID)  ETHANOL  RAPID URINE DRUG SCREEN, HOSP PERFORMED    EKG None  Radiology No results found.  Procedures Procedures (including critical care time)  Medications Ordered in ED Medications - No data to display  ED Course  I have reviewed the triage vital signs and the nursing notes.  Pertinent labs & imaging results that were available during my care of the patient were reviewed by me and considered in my medical decision making (see chart for details).  Clinical Course as of Oct 01 448  Sat Oct 01, 2020  8756 Spoke with mother.  She reports that he has a history of psychosis.  Often times he is agitated during these episodes but he has had several episodes where he "just stares off and says nothing."  She reports that he does not take his medications regularly.  She reports that she picked him up from work yesterday evening and he seemed fine but then began to talk about stressful things at work.  Ultimately he stopped talking and just started staring around and not interacting.   [CH]  [phone] Mother reports that often his psychosis is provoked by marijuana use.   [CH]    Clinical Course User Index [CH] Declyn Offield, Mayer Masker, MD   MDM Rules/Calculators/A&P                           Patient presents with episode however he is not interactive and will not speak.  Mother reports is consistent with a prior psychiatric episode.  He is overall nontoxic and vital signs notable for mild tachycardia.  ABCs are intact and no obvious physical exam findings.  Labs reviewed.  No significant metabolic derangements.  Alcohol level less than 10.  UDS ordered.  EKG reviewed and without evidence of acute ischemia or arrhythmia.  Patient cleared for psychiatric evaluation.  Recommend a.m. psych eval.  Final Clinical Impression(s) / ED Diagnoses Final  diagnoses:  None    Rx / DC Orders ED Discharge Orders    None       Shawneequa Baldridge, Mayer Masker, MD 10/01/20 0451

## 2020-10-02 DIAGNOSIS — F12951 Cannabis use, unspecified with psychotic disorder with hallucinations: Secondary | ICD-10-CM

## 2020-10-02 NOTE — BHH Suicide Risk Assessment (Cosign Needed)
Suicide Risk Assessment  Discharge Assessment   [facility] Discharge Suicide Risk Assessment   Principal Problem: Cannabis-induced psychotic disorder with hallucinations (HCC) Discharge Diagnoses: Principal Problem:   Cannabis-induced psychotic disorder with hallucinations (HCC)   INTERVAL PROGRESS NOTE 10/02/2020: Patient seen via telepsych by this provider; chart reviewed and consulted with Dr. Lucianne Muss on 10/02/20.  On evaluation Hiroyuki Ozanich reports , " I am doing fine today.  How are you?"  He is more clear and coherent today, alert and oriented x4.  Regarding his reason for hospitalization he states, "I'm doing a lot better,just trying to figure out why I am here. I remember getting off from work, laying down and waking up in here.  I did have some weed but...." He endorses marijuana use since the age of 31 and expresses he is not motivated to stop today.  "I smoke about 3-4 black n miles a day.  This is nothing new.  I've been here before after smoking weed.  I get like this when I am tired too."  He states he works at [employer] and lives at home with his mother. He states, "she doesn't have a problem with me smoking as long as I keep it away from her."  He is known to our facility with similar presentation, last visit was 6 months prior.  He was stabilized and discharged to follow up with [facility] which to date he states he has not completed and no longer taking prescribed antipsychotics.  He denies suicidal or homicidal ideations, plan or intent.  He denies audible or visual hallucinations.    HPI Per EDP notes 10/01/2020: This is a 19 year old male with a history of ADHD, psychosis, adjustment disorder who presents with a "behavior episode."  Per EMS, mother called them for a behavior episode.  He has a history of psychosis and was reportedly agitated at home.  Vital signs were stable in route.  Patient will not provide any history.  He does not speak.  He occasionally makes eye contact and  appears to laugh inappropriately.   Total Time spent with patient: 30 minutes  Musculoskeletal: Unable to assess via telepsych  Psychiatric Specialty Exam: @ROSBYAGE @  Blood pressure 137/60, pulse 60, temperature 98.2 F (36.8 C), temperature source Oral, resp. rate 18, SpO2 99 %.There is no height or weight on file to calculate BMI.  General Appearance: Casual  Eye Contact::  Good  Speech:  Clear and Coherent and Normal Rate409  Volume:  Normal  Mood:  Euthymic  Affect:  Appropriate and Congruent  Thought Process:  Coherent and Descriptions of Associations: Intact  Orientation:  Full (Time, Place, and Person)  Thought Content:  Logical  Suicidal Thoughts:  No  Homicidal Thoughts:  No  Memory:  Immediate;   Good Recent;   Good Remote;   Good  Judgement:  Other:  intact but fair in the setting of continued drug use  Insight:  Fair in the setting of continued drug usag  Psychomotor Activity:  Normal  Concentration:  Good  Recall:  Good  Fund of Knowledge:Good  Language: Good  Akathisia:  NA  Handed:  Right  AIMS (if indicated):     Assets:  Communication Skills Housing Social Support  Sleep:     Cognition: WNL  ADL's:  Intact   Mental Status Per Nursing Assessment::   On Admission:     Demographic Factors:  Male  Loss Factors: NA  Historical Factors: NA  Risk Reduction Factors:   Sense of  responsibility to family, Employed and Living with another person, especially a relative  Continued Clinical Symptoms:  Alcohol/Substance Abuse/Dependencies  Cognitive Features That Contribute To Risk:  None    Suicide Risk:  Minimal: No identifiable suicidal ideation.  Patients presenting with no risk factors but with morbid ruminations; may be classified as minimal risk based on the severity of the depressive symptoms   Follow-up Information    Diamantina Monks, MD.   Specialty: Pediatrics Contact information: [address] [place]  [phone] [phone]               Plan Of Care/Follow-up recommendations:  Plan- As per above assessment,  there are no current grounds for involuntary commitment at this time.?  Patient is not currently interested in inpatient services and declines outpatient referral for substance abuse treatment.  In lieu of his history for similar presentation and marijuana dependency, he is given peer support and community resources for abstinence.  We have reviewed importance of substance abuse abstinence, potential negative impact substance abuse can have on his relationships and level of functioning.   ?  Spoke with Dr.hSheldon EDP, Lum Babe, RN and Corrie Mckusick, SW; informed of above recommendation and disposition   Chales Abrahams, NP 10/02/2020, 11:35 AM

## 2020-10-02 NOTE — ED Notes (Signed)
Pt calm, cooperative, no s/s of distress. Pt guarded.  Medication compliant.

## 2020-10-02 NOTE — ED Provider Notes (Signed)
Emergency Medicine Observation Re-evaluation Note  Gabriel Nelson is a 19 y.o. male, seen on rounds today.  Pt initially presented to the ED for complaints of Aggressive Behavior Currently, the patient is calm and cooperative  Physical Exam  BP 133/76 (BP Location: Left Arm)   Pulse (!) 56   Temp 98 F (36.7 C) (Oral)   Resp 16   SpO2 99%  Physical Exam General: Awake Cardiac:  Lungs:  Psych: Calm, ambulatory, no distress  ED Course / MDM  EKG:EKG Interpretation  Date/Time:  Saturday October 01 2020 03:15:09 EDT Ventricular Rate:  113 PR Interval:    QRS Duration: 92 QT Interval:  318 QTC Calculation: 436 R Axis:   90 Text Interpretation: Sinus tachycardia Consider right atrial enlargement Borderline right axis deviation ST elev, probable normal early repol pattern Confirmed by Gabriel Nelson ([id number]) on 10/01/2020 4:50:42 AM  Clinical Course as of Oct 02 956  Sat Oct 01, 2020  0252 Spoke with mother.  She reports that he has a history of psychosis.  Often times he is agitated during these episodes but he has had several episodes where he "just stares off and says nothing."  She reports that he does not take his medications regularly.  She reports that she picked him up from work yesterday evening and he seemed fine but then began to talk about stressful things at work.  Ultimately he stopped talking and just started staring around and not interacting.   [CH]  [phone] Mother reports that often his psychosis is provoked by marijuana use.   [CH]    Clinical Course User Index [CH] Horton, Gabriel Masker, MD   I have reviewed the labs performed to date as well as medications administered while in observation.  Recent changes in the last 24 hours include none.  Plan  Current plan is for Psych eval and dispo. Patient is not under full IVC at this time.   Pollyann Savoy, MD 10/02/20 [phone]

## 2020-10-02 NOTE — ED Notes (Signed)
Pt DC d off unit to home per provider. Pt alert, calm, cooperative, no s/s of distress. DC information given to and reviewed with pt, pt acknowledged understanding. Pt ambulatory off unit, escorted by NT. Pt transported by family.

## 2021-02-06 ENCOUNTER — Other Ambulatory Visit: Payer: Self-pay

## 2021-02-06 ENCOUNTER — Emergency Department (HOSPITAL_COMMUNITY)
Admission: EM | Admit: 2021-02-06 | Discharge: 2021-02-07 | Disposition: A | Discharge status: Home / Self Care | Payer: Medicaid Other | Attending: Emergency Medicine | Admitting: Emergency Medicine

## 2021-02-06 DIAGNOSIS — Z20822 Contact with and (suspected) exposure to covid-19: Secondary | ICD-10-CM | POA: Insufficient documentation

## 2021-02-06 DIAGNOSIS — F12159 Cannabis abuse with psychotic disorder, unspecified: Secondary | ICD-10-CM | POA: Diagnosis not present

## 2021-02-06 DIAGNOSIS — F17210 Nicotine dependence, cigarettes, uncomplicated: Secondary | ICD-10-CM | POA: Diagnosis not present

## 2021-02-06 DIAGNOSIS — R462 Strange and inexplicable behavior: Secondary | ICD-10-CM

## 2021-02-06 DIAGNOSIS — Z046 Encounter for general psychiatric examination, requested by authority: Secondary | ICD-10-CM | POA: Diagnosis present

## 2021-02-06 LAB — CBC WITH DIFFERENTIAL/PLATELET
Abs Immature Granulocytes: 0.06 10*3/uL (ref 0.00–0.07)
Basophils Absolute: 0 10*3/uL (ref 0.0–0.1)
Basophils Relative: 0 %
Eosinophils Absolute: 0 10*3/uL (ref 0.0–0.5)
Eosinophils Relative: 0 %
HCT: 47.2 % (ref 39.0–52.0)
Hemoglobin: 15.8 g/dL (ref 13.0–17.0)
Immature Granulocytes: 1 %
Lymphocytes Relative: 18 %
Lymphs Abs: 1.5 10*3/uL (ref 0.7–4.0)
MCH: 29.1 pg (ref 26.0–34.0)
MCHC: 33.5 g/dL (ref 30.0–36.0)
MCV: 86.9 fL (ref 80.0–100.0)
Monocytes Absolute: 0.8 10*3/uL (ref 0.1–1.0)
Monocytes Relative: 10 %
Neutro Abs: 6.1 10*3/uL (ref 1.7–7.7)
Neutrophils Relative %: 71 %
Platelets: 190 10*3/uL (ref 150–400)
RBC: 5.43 MIL/uL (ref 4.22–5.81)
RDW: 11.9 % (ref 11.5–15.5)
WBC: 8.6 10*3/uL (ref 4.0–10.5)
nRBC: 0 % (ref 0.0–0.2)

## 2021-02-06 LAB — BASIC METABOLIC PANEL
Anion gap: 13 (ref 5–15)
BUN: 15 mg/dL (ref 6–20)
CO2: 23 mmol/L (ref 22–32)
Calcium: 9.8 mg/dL (ref 8.9–10.3)
Chloride: 102 mmol/L (ref 98–111)
Creatinine, Ser: 1.27 mg/dL — ABNORMAL HIGH (ref 0.61–1.24)
GFR, Estimated: >60 mL/min (ref >60)
Glucose, Bld: 105 mg/dL — ABNORMAL HIGH (ref 70–99)
Potassium: 3.7 mmol/L (ref 3.5–5.1)
Sodium: 138 mmol/L (ref 135–145)

## 2021-02-06 LAB — RAPID URINE DRUG SCREEN, HOSP PERFORMED
Amphetamines: NOT DETECTED
Barbiturates: NOT DETECTED
Benzodiazepines: NOT DETECTED
Cocaine: NOT DETECTED
Opiates: NOT DETECTED
Tetrahydrocannabinol: POSITIVE — AB

## 2021-02-06 LAB — ACETAMINOPHEN LEVEL: Acetaminophen (Tylenol), Serum: <10 ug/mL — ABNORMAL LOW (ref 10–30)

## 2021-02-06 LAB — RESP PANEL BY RT-PCR (FLU A&B, COVID) ARPGX2
Influenza A by PCR: NEGATIVE
Influenza B by PCR: NEGATIVE
SARS Coronavirus 2 by RT PCR: NEGATIVE

## 2021-02-06 LAB — SALICYLATE LEVEL: Salicylate Lvl: <7 mg/dL — ABNORMAL LOW (ref 7.0–30.0)

## 2021-02-06 LAB — ETHANOL: Alcohol, Ethyl (B): <10 mg/dL (ref <10)

## 2021-02-06 MED ORDER — OLANZAPINE 5 MG PO TABS
5.0000 mg | ORAL_TABLET | Freq: Two times a day (BID) | ORAL | Status: DC
  Administered 2021-02-06 – 2021-02-07 (×2): 5 mg via ORAL
  Filled 2021-02-06 (×2): qty 1

## 2021-02-06 MED ORDER — AMLODIPINE BESYLATE 5 MG PO TABS
5.0000 mg | ORAL_TABLET | Freq: Every day | ORAL | Status: DC
  Administered 2021-02-06 – 2021-02-07 (×2): 5 mg via ORAL
  Filled 2021-02-06 (×2): qty 1

## 2021-02-06 NOTE — ED Notes (Signed)
Urine specimen obtained Gave patient a ham sandwich and sprite

## 2021-02-06 NOTE — ED Triage Notes (Signed)
Pt to [facility] by EMS.  Pt called EMS. Pt voluntary . Pt needs psych evaluation.  Pt has hx of psych disorder. Pt seems confused and slow to respond according to EMS. Pt cooperative. Pt dressing out.

## 2021-02-06 NOTE — ED Notes (Signed)
Patients belongs (1) bag (no phone) in locker #27 with patients label attached to bag

## 2021-02-06 NOTE — ED Provider Notes (Signed)
[facility]   CSN: [id number] Arrival date & time: 02/06/21  1836     History Chief Complaint  Patient presents with  . Psychiatric Evaluation    Gabriel Nelson is a 20 y.o. male.  HPI   Patient with significant medical history of ADHD, cannabis induced psychosis, psychotic disorder presents with chief complaint of not feeling right.  He endorses that he woke up today and was not feeling himself, he denies suicidal or homicidal ideations, denies hallucinations, delusions, denies illicit drug use, states he has been compliant with his medications.  He states he has no complaints at this time, and states nothing is bothering him.  He states that he came in here to the emergency room because he wanted to be checked out to make sure that he is okay.  He denies any alleviating factors.  Patient denies headaches, fevers, chills, shortness of breath, chest pain, abdominal pain, nausea, vomiting, diarrhea, worsening pedal edema.  Past Medical History:  Diagnosis Date  . ADHD   . Medical history non-contributory     Patient Active Problem List   Diagnosis Date Noted  . 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 Psychotic disorder (HCC) 04/04/2020  . Psychosis (HCC)   . Moderate cannabis use disorder (HCC) 08/08/2018  . 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 Rhabdomyolysis 10/27/2016  . Marijuana intoxication (HCC) 10/27/2016  . Osteomyelitis (HCC) 10/23/2015    Past Surgical History:  Procedure Laterality Date  . NO PAST SURGERIES         Family History  Problem Relation Age of Onset  . Healthy Mother     Social History   Tobacco Use  . Smoking status: Current Every Day Smoker    Packs/day: 1.00    Years: 4.00    Pack years: 4.00    Types: Cigarettes, Cigars  . Smokeless tobacco: Never Used  Vaping Use  . Vaping Use: Never used  Substance Use  Topics  . Alcohol use: Yes    Comment: He states occassionally, reports last beer was when his brother died.   . Drug use: Yes    Types: Marijuana    Comment: Daily. Last used: yesterday     Home Medications Prior to Admission medications   Medication Sig Start Date End Date Taking? Authorizing Provider  amLODipine (NORVASC) 5 MG tablet Take 1 tablet (5 mg total) by mouth daily. 04/08/20  Yes Money, Gerlene Burdock, FNP  OLANZapine (ZYPREXA) 5 MG tablet Take 1 tablet (5 mg total) by mouth 2 (two) times daily. 04/07/20  Yes Money, Gerlene Burdock, FNP    Allergies    Patient has no known allergies.  Review of Systems   Review of Systems  Constitutional: Negative for chills and fever.  HENT: Negative for congestion.   Respiratory: Negative for shortness of breath.   Cardiovascular: Negative for chest pain.  Gastrointestinal: Negative for abdominal pain, diarrhea, nausea and vomiting.  Genitourinary: Negative for enuresis.  Musculoskeletal: Negative for back pain.  Skin: Negative for rash.  Neurological: Negative for dizziness.  Hematological: Does not bruise/bleed easily.  Psychiatric/Behavioral: Negative for hallucinations, self-injury and suicidal ideas.    Physical Exam Updated Vital Signs BP 125/78 (BP Location: Left Arm)   Pulse 86   Temp 98.7 F (37.1 C) (Oral)   Resp 17   SpO2 98%   Physical Exam Vitals and nursing note reviewed.  Constitutional:      General: He  is not in acute distress.    Appearance: He is not ill-appearing.  HENT:     Head: Normocephalic and atraumatic.     Nose: No congestion.  Eyes:     Conjunctiva/sclera: Conjunctivae normal.  Cardiovascular:     Rate and Rhythm: Normal rate and regular rhythm.     Pulses: Normal pulses.     Heart sounds: No murmur heard. No friction rub. No gallop.   Pulmonary:     Effort: No respiratory distress.     Breath sounds: No wheezing, rhonchi or rales.  Abdominal:     Palpations: Abdomen is soft.     Tenderness:  There is no abdominal tenderness.  Musculoskeletal:     Right lower leg: No edema.     Left lower leg: No edema.     Comments: Patient moving all 4 extremities at difficulty.  Skin:    General: Skin is warm and dry.  Neurological:     Mental Status: He is alert.  Psychiatric:        Mood and Affect: Mood normal.     ED Results / Procedures / Treatments   Labs (all labs ordered are listed, but only abnormal results are displayed) Labs Reviewed  RAPID URINE DRUG SCREEN, HOSP PERFORMED - Abnormal; Notable for the following components:      Result Value   Tetrahydrocannabinol POSITIVE (*)    All other components within normal limits  BASIC METABOLIC PANEL - Abnormal; Notable for the following components:   Glucose, Bld 105 (*)    Creatinine, Ser 1.27 (*)    All other components within normal limits  SALICYLATE LEVEL - Abnormal; Notable for the following components:   Salicylate Lvl <7.0 (*)    All other components within normal limits  ACETAMINOPHEN LEVEL - Abnormal; Notable for the following components:   Acetaminophen (Tylenol), Serum <10 (*)    All other components within normal limits  RESP PANEL BY RT-PCR (FLU A&B, COVID) ARPGX2  ETHANOL  CBC WITH DIFFERENTIAL/PLATELET    EKG None  Radiology No results found.  Procedures Procedures   Medications Ordered in ED Medications  amLODipine (NORVASC) tablet 5 mg (has no administration in time range)  OLANZapine (ZYPREXA) tablet 5 mg (has no administration in time range)    ED Course  I have reviewed the triage vital signs and the nursing notes.  Pertinent labs & imaging results that were available during my care of the patient were reviewed by me and considered in my medical decision making (see chart for details).    MDM Rules/Calculators/A&P                         Initial impression-patient presents not feeling right.  He is alert does not appear acute distress, vital signs reassuring.  Will obtain med  clearance lab work, and reevaluate.  Work-up-CBC unremarkable, BMP shows slight hyperglycemia 105, slight increase in creatinine 1.27, baseline 1.2, acetaminophen less than 10, salicylate level less than 7.  Ethanol less than 10 rapid drug screen positive for cannabinoids  Consult awaiting TTS recommendations  Rule out-I have low suspicion for systemic infection as patient is nontoxic-appearing, vital signs reassuring, no obvious source of infection on my exam.  Low suspicion for ACS as patient has chest pain, shortness of breath, no signs of hypoperfusion fluid overload on my exam, EKG is without signs of ischemia.  Low suspicion for intra-abdominal abnormality as patient is abdomen soft nontender to palpation, tolerating  p.o. without difficulty.   Plan- At this time patient is medically cleared will await further TTS recommendations.  Will place patient in a psych hold and order home meds.  Patient is not IVC at this time as he does endorse suicidal homicidal ideations, hallucination or delusions.     Final Clinical Impression(s) / ED Diagnoses Final diagnoses:  Bizarre behavior    Rx / DC Orders ED Discharge Orders    None       Barnie Del 02/06/21 2157    Charlynne Pander, MD 02/06/21 2352

## 2021-02-07 MED ORDER — OLANZAPINE 5 MG PO TABS
5.0000 mg | ORAL_TABLET | Freq: Two times a day (BID) | ORAL | 1 refills | Status: DC
Start: 2021-02-07 — End: 2022-02-18

## 2021-02-07 NOTE — ED Provider Notes (Signed)
  Physical Exam  BP 122/76 (BP Location: Left Arm)   Pulse (!) 57   Temp 97.9 F (36.6 C) (Oral)   Resp 18   SpO2 96%   Physical Exam  ED Course/Procedures     Procedures  MDM  Psychiatry has seen patient and cleared for discharge.  They requested a month supply of his Zyprexa be written.  We will give resources but patient will be discharged       Benjiman Core, MD 02/07/21 1843

## 2021-02-07 NOTE — BH Assessment (Signed)
TTS clinician established contact with mother--pts mother stated that she was speaking with another [facility] staff member on another line. Released call so that mother could continue call with original [facility] staff member.  Weyman Pedro, MSW, LCSW Outpatient Therapist/Triage Specialist

## 2021-02-07 NOTE — BH Assessment (Addendum)
Per Assunta Found, NP, patient is psychiatrically cleared. Shuvon spoke with Dr. Rubin Payor and gave disposition and recommendations.  Resources placed in patient AVS.   Collateral obtained from patient mother Cynirae who reports that patient has been quite and not responding to anyone since Sunday. Mom reports that patient was not himself however, patient has a history of violent and aggressive episodes. Mom reports that patient did not want to play the game with siblings and was just quite. Mom reports that patient stresses out about a lot of things but patient did not tell her anything that was stressing him out. Mom reports that patient is not compliant with his medications and he is smoking mariajuana which usually triggers episodes. Mom reports that patient use to receive outpatient service at [facility] but no longer have outpatient service. Mom reports last psychiatric hospitalization was in 2016 in [place]. Mom concerned about patient having medications. Mom feels safe with patient returning home and wants to be contacted when patient is discharged. TTS reviewed [facility] services to include urgent care and outpatient services with mom.
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
## 2021-02-07 NOTE — ED Provider Notes (Signed)
Disposition pending.  Behavioral health is waiting on collateral information   Bethann Berkshire, MD 02/07/21 1109

## 2021-02-07 NOTE — Discharge Instructions (Signed)
Follow-up with the resources given.  The Zyprexa prescriptions have been sent to the pharmacy.

## 2021-02-07 NOTE — BH Assessment (Signed)
Disposition Melbourne Abts, PA, disposition pending collateral contact. Additional information needed.

## 2022-02-17 ENCOUNTER — Emergency Department (HOSPITAL_COMMUNITY)
Admission: EM | Admit: 2022-02-17 | Discharge: 2022-02-18 | Disposition: A | Discharge status: Home / Self Care | Payer: Medicaid Other | Attending: Emergency Medicine | Admitting: Emergency Medicine

## 2022-02-17 ENCOUNTER — Emergency Department (HOSPITAL_COMMUNITY): Payer: Medicaid Other

## 2022-02-17 ENCOUNTER — Other Ambulatory Visit: Payer: Self-pay

## 2022-02-17 DIAGNOSIS — R000 Tachycardia, unspecified: Secondary | ICD-10-CM | POA: Insufficient documentation

## 2022-02-17 DIAGNOSIS — R4182 Altered mental status, unspecified: Secondary | ICD-10-CM | POA: Diagnosis not present

## 2022-02-17 DIAGNOSIS — R451 Restlessness and agitation: Secondary | ICD-10-CM | POA: Diagnosis not present

## 2022-02-17 DIAGNOSIS — F29 Unspecified psychosis not due to a substance or known physiological condition: Secondary | ICD-10-CM | POA: Diagnosis not present

## 2022-02-17 DIAGNOSIS — Z79899 Other long term (current) drug therapy: Secondary | ICD-10-CM | POA: Diagnosis not present

## 2022-02-17 DIAGNOSIS — R462 Strange and inexplicable behavior: Secondary | ICD-10-CM

## 2022-02-17 DIAGNOSIS — Z20822 Contact with and (suspected) exposure to covid-19: Secondary | ICD-10-CM | POA: Diagnosis not present

## 2022-02-17 DIAGNOSIS — F1424 Cocaine dependence with cocaine-induced mood disorder: Secondary | ICD-10-CM | POA: Insufficient documentation

## 2022-02-17 DIAGNOSIS — F909 Attention-deficit hyperactivity disorder, unspecified type: Secondary | ICD-10-CM | POA: Diagnosis not present

## 2022-02-17 DIAGNOSIS — F12988 Cannabis use, unspecified with other cannabis-induced disorder: Secondary | ICD-10-CM | POA: Diagnosis present

## 2022-02-17 DIAGNOSIS — F39 Unspecified mood [affective] disorder: Secondary | ICD-10-CM | POA: Diagnosis present

## 2022-02-17 LAB — CBC WITH DIFFERENTIAL/PLATELET
Abs Immature Granulocytes: 0.02 10*3/uL (ref 0.00–0.07)
Basophils Absolute: 0.1 10*3/uL (ref 0.0–0.1)
Basophils Relative: 1 %
Eosinophils Absolute: 0 10*3/uL (ref 0.0–0.5)
Eosinophils Relative: 0 %
HCT: 47.3 % (ref 39.0–52.0)
Hemoglobin: 15.9 g/dL (ref 13.0–17.0)
Immature Granulocytes: 0 %
Lymphocytes Relative: 12 %
Lymphs Abs: 1.1 10*3/uL (ref 0.7–4.0)
MCH: 28.9 pg (ref 26.0–34.0)
MCHC: 33.6 g/dL (ref 30.0–36.0)
MCV: 86 fL (ref 80.0–100.0)
Monocytes Absolute: 0.9 10*3/uL (ref 0.1–1.0)
Monocytes Relative: 10 %
Neutro Abs: 6.9 10*3/uL (ref 1.7–7.7)
Neutrophils Relative %: 77 %
Platelets: 198 10*3/uL (ref 150–400)
RBC: 5.5 MIL/uL (ref 4.22–5.81)
RDW: 12.2 % (ref 11.5–15.5)
WBC: 9 10*3/uL (ref 4.0–10.5)
nRBC: 0 % (ref 0.0–0.2)

## 2022-02-17 LAB — RAPID URINE DRUG SCREEN, HOSP PERFORMED
Amphetamines: NOT DETECTED
Barbiturates: NOT DETECTED
Benzodiazepines: NOT DETECTED
Cocaine: NOT DETECTED
Opiates: NOT DETECTED
Tetrahydrocannabinol: POSITIVE — AB

## 2022-02-17 LAB — COMPREHENSIVE METABOLIC PANEL
ALT: 22 U/L (ref 0–44)
AST: 22 U/L (ref 15–41)
Albumin: 4.9 g/dL (ref 3.5–5.0)
Alkaline Phosphatase: 67 U/L (ref 38–126)
Anion gap: 14 (ref 5–15)
BUN: 13 mg/dL (ref 6–20)
CO2: 22 mmol/L (ref 22–32)
Calcium: 9.5 mg/dL (ref 8.9–10.3)
Chloride: 102 mmol/L (ref 98–111)
Creatinine, Ser: 1.52 mg/dL — ABNORMAL HIGH (ref 0.61–1.24)
GFR, Estimated: >60 mL/min (ref >60)
Glucose, Bld: 106 mg/dL — ABNORMAL HIGH (ref 70–99)
Potassium: 3.2 mmol/L — ABNORMAL LOW (ref 3.5–5.1)
Sodium: 138 mmol/L (ref 135–145)
Total Bilirubin: 0.7 mg/dL (ref 0.3–1.2)
Total Protein: 8.8 g/dL — ABNORMAL HIGH (ref 6.5–8.1)

## 2022-02-17 LAB — RESP PANEL BY RT-PCR (FLU A&B, COVID) ARPGX2
Influenza A by PCR: NEGATIVE
Influenza B by PCR: NEGATIVE
SARS Coronavirus 2 by RT PCR: NEGATIVE

## 2022-02-17 LAB — ACETAMINOPHEN LEVEL: Acetaminophen (Tylenol), Serum: <10 ug/mL — ABNORMAL LOW (ref 10–30)

## 2022-02-17 LAB — SALICYLATE LEVEL: Salicylate Lvl: <7 mg/dL — ABNORMAL LOW (ref 7.0–30.0)

## 2022-02-17 LAB — CK: Total CK: 266 U/L (ref 49–397)

## 2022-02-17 LAB — CBG MONITORING, ED: Glucose-Capillary: 111 mg/dL — ABNORMAL HIGH (ref 70–99)

## 2022-02-17 LAB — ETHANOL: Alcohol, Ethyl (B): <10 mg/dL (ref <10)

## 2022-02-17 MED ORDER — STERILE WATER FOR INJECTION IJ SOLN
INTRAMUSCULAR | Status: AC
  Filled 2022-02-17: qty 10

## 2022-02-17 MED ORDER — SODIUM CHLORIDE 0.9 % IV BOLUS
1000.0000 mL | Freq: Once | INTRAVENOUS | Status: AC
  Administered 2022-02-17: 1000 mL via INTRAVENOUS

## 2022-02-17 MED ORDER — ZIPRASIDONE MESYLATE 20 MG IM SOLR
20.0000 mg | Freq: Once | INTRAMUSCULAR | Status: AC
  Administered 2022-02-17: 20 mg via INTRAMUSCULAR
  Filled 2022-02-17: qty 20

## 2022-02-17 NOTE — ED Triage Notes (Signed)
[place]IB [facility] [place] initial call out for seizures. On arrival to ED pt is non-verbal, generalized tremors, tachycardiac, and diaphoretic.  ? ?

## 2022-02-17 NOTE — ED Provider Notes (Signed)
?[facility] ?Provider Note ? ? ?CSN: [id number] ?Arrival date & time: 02/17/22  1944 ? ?  ? ?History ? ?Chief Complaint  ?Patient presents with  ? Altered Mental Status  ? ? ?Gabriel Nelson is a 21 y.o. male. ? ?Patient brought in by ambulance.  Reportedly seen at a gas station with bizarre behavior.  On arrival patient will not speak and will not give any additional history.  No additional family or EMS present currently to give additional history either. ? ? ?  ? ?Home Medications ?Prior to Admission medications   ?Medication Sig Start Date End Date Taking? Authorizing Provider  ?amLODipine (NORVASC) 5 MG tablet Take 1 tablet (5 mg total) by mouth daily. 04/08/20   Money, Gerlene Burdock, FNP  ?OLANZapine (ZYPREXA) 5 MG tablet Take 1 tablet (5 mg total) by mouth 2 (two) times daily. 02/07/21   Benjiman Core, MD  ?   ? ?Allergies    ?Patient has no known allergies.   ? ?Review of Systems   ?Review of Systems  ?Unable to perform ROS: Psychiatric disorder  ? ?Physical Exam ?Updated Vital Signs ?BP (!) 152/70   Pulse 80   Temp 99.4 ?F (37.4 ?C) (Axillary)   Resp (!) 24   SpO2 96%  ?Physical Exam ?Constitutional:   ?   Appearance: He is well-developed.  ?HENT:  ?   Head: Normocephalic.  ?   Nose: Nose normal.  ?Eyes:  ?   Extraocular Movements: Extraocular movements intact.  ?Cardiovascular:  ?   Rate and Rhythm: Tachycardia present.  ?Pulmonary:  ?   Effort: Pulmonary effort is normal.  ?Skin: ?   Coloration: Skin is not jaundiced.  ?Neurological:  ?   Mental Status: He is alert.  ?   Comments: Patient is awake and stares at me as a walk-in.  He is able to track movement.  He has no focal neurodeficit moving all extremities turning his head left and right.  However patient will not speak with me and will not answer any of my questions.  ? ? ?ED Results / Procedures / Treatments   ?Labs ?(all labs ordered are listed, but only abnormal results are displayed) ?Labs Reviewed  ?COMPREHENSIVE  METABOLIC PANEL - Abnormal; Notable for the following components:  ?    Result Value  ? Potassium 3.2 (*)   ? Glucose, Bld 106 (*)   ? Creatinine, Ser 1.52 (*)   ? Total Protein 8.8 (*)   ? All other components within normal limits  ?RAPID URINE DRUG SCREEN, HOSP PERFORMED - Abnormal; Notable for the following components:  ? Tetrahydrocannabinol POSITIVE (*)   ? All other components within normal limits  ?ACETAMINOPHEN LEVEL - Abnormal; Notable for the following components:  ? Acetaminophen (Tylenol), Serum <10 (*)   ? All other components within normal limits  ?SALICYLATE LEVEL - Abnormal; Notable for the following components:  ? Salicylate Lvl <7.0 (*)   ? All other components within normal limits  ?CBG MONITORING, ED - Abnormal; Notable for the following components:  ? Glucose-Capillary 111 (*)   ? All other components within normal limits  ?RESP PANEL BY RT-PCR (FLU A&B, COVID) ARPGX2  ?CBC WITH DIFFERENTIAL/PLATELET  ?ETHANOL  ?CK  ? ? ?EKG ?None ? ?Radiology ?DG Chest Portable 1 View ? ?Result Date: 02/17/2022 ?CLINICAL DATA:  Seizure. EXAM: PORTABLE CHEST 1 VIEW COMPARISON:  August 04, 2018 FINDINGS: The heart size and mediastinal contours are within normal limits. Both lungs are clear. The  visualized skeletal structures are unremarkable. IMPRESSION: No active disease. Electronically Signed   By: Aram Candela M.D.   On: 02/17/2022 20:48   ? ?Procedures ?Procedures  ? ? ?Medications Ordered in ED ?Medications  ?sodium chloride 0.9 % bolus 1,000 mL (0 mLs Intravenous Stopped 02/17/22 2214)  ?ziprasidone (GEODON) injection 20 mg (20 mg Intramuscular Given 02/17/22 2020)  ?sterile water (preservative free) injection (  Given 02/17/22 2022)  ? ? ?ED Course/ Medical Decision Making/ A&P ?  ?                        ?Medical Decision Making ?Amount and/or Complexity of Data Reviewed ?Labs: ordered. ?Radiology: ordered. ? ?Risk ?Prescription drug management. ? ? ?Chart review shows multiple prior visits for  psychosis or aggressive behavior. ? ?Labs were ordered which were unremarkable UDS positive for marijuana. ? ?Patient became agitated combative and uncooperative, given Geodon with improvement of vitals. ? ?CT imaging of the brain pursued.  I suspect recurrent psychiatric disorder. ? ? ? ? ? ? ? ?Final Clinical Impression(s) / ED Diagnoses ?Final diagnoses:  ?Bizarre behavior  ? ? ?Rx / DC Orders ?ED Discharge Orders   ? ? None  ? ?  ? ? ?  ?Cheryll Cockayne, MD ?02/17/22 2253 ? ?

## 2022-02-17 NOTE — ED Notes (Signed)
Pt is gesturing and nodding appropriately. No verbal communication. No resting comfortably.  ?

## 2022-02-17 NOTE — ED Notes (Signed)
Pt is alert but not responding to staff. No verbal communication from pt and not following commands. Currently with generalized tremors.  ?

## 2022-02-17 NOTE — ED Notes (Signed)
Pt given IM Geodon for agitation.  ?

## 2022-02-18 DIAGNOSIS — F12988 Cannabis use, unspecified with other cannabis-induced disorder: Secondary | ICD-10-CM | POA: Diagnosis present

## 2022-02-18 DIAGNOSIS — F39 Unspecified mood [affective] disorder: Secondary | ICD-10-CM | POA: Diagnosis present

## 2022-02-18 MED ORDER — OLANZAPINE 5 MG PO TBDP: 5.0000 mg | ORAL_TABLET | Freq: Every day | ORAL | 0 refills | Status: DC

## 2022-02-18 MED ORDER — AMLODIPINE BESYLATE 5 MG PO TABS
5.0000 mg | ORAL_TABLET | Freq: Every day | ORAL | Status: DC
  Administered 2022-02-18: 5 mg via ORAL
  Filled 2022-02-18: qty 1

## 2022-02-18 MED ORDER — AMLODIPINE BESYLATE 5 MG PO TABS: 5.0000 mg | ORAL_TABLET | Freq: Every day | ORAL | 0 refills | Status: DC

## 2022-02-18 MED ORDER — SERTRALINE HCL 25 MG PO TABS: 25.0000 mg | ORAL_TABLET | Freq: Every day | ORAL | 0 refills | Status: DC

## 2022-02-18 MED ORDER — OLANZAPINE 5 MG PO TBDP: 5.0000 mg | ORAL_TABLET | Freq: Every day | ORAL | Status: DC

## 2022-02-18 MED ORDER — SERTRALINE HCL 50 MG PO TABS
25.0000 mg | ORAL_TABLET | Freq: Every day | ORAL | Status: DC
  Administered 2022-02-18: 25 mg via ORAL
  Filled 2022-02-18: qty 1

## 2022-02-18 NOTE — ED Provider Notes (Signed)
Labs and imaging have been reviewed, no acute abnormalities noted.  Patient medically cleared for psychiatric evaluation. ?  ?Orpah Greek, MD ?02/18/22 0034 ? ?

## 2022-02-18 NOTE — Progress Notes (Signed)
CSW provided the following resource to follow-up with listed below:  ? ?[facility]-will provide timely access to mental health services for children and adolescents (4-17) and adults presenting in a mental health crisis. The program is designed for those who need urgent Behavioral Health or Substance Use treatment and are not experiencing a medical crisis that would typically require an emergency room visit.  ?  ?[address] ?[place], [place] [address] ?Phone: [phone] ?[email or web address] ?  ?[facility] will also offer the following outpatient services: (Monday through Friday 8am-5pm) ?  ?Partial Hospitalization Program (PHP) ?Substance Abuse Intensive Outpatient Program (SA-IOP) ?Group Therapy ?Medication Management ?Peer Living Room ?  ?We also provide (24/7):  ?  ?Assessments: Our mental health clinician and providers will conduct a focused mental health evaluation, assessing for immediate safety concerns and further mental health needs. ?  ?Referral: Our team will provide resources and help connect to community based mental health treatment, when indicated, including psychotherapy, psychiatry, and other specialized behavioral health or substance use disorder services (for those not already in treatment). ?  ?Transitional Care: Our team providers in person bridging and/or telphonic follow-up during the patient's transition to outpatient services.  ?  ?  ?Crissie Reese, MSW, LCSW-A, LCAS-A ?Phone: [phone] ?Disposition/TOC ? ?

## 2022-02-18 NOTE — ED Notes (Signed)
Pt changed into purple scrubs. Pt belongings placed in nurse's station cabinet 16-18. Pt has one belongings bag.  ?

## 2022-02-18 NOTE — Consult Note (Signed)
[facility] Psych ED Discharge ? ?02/18/2022 12:13 PM ?Gabriel Nelson  ?MRN:  [id number] ? ?Method of visit?: Face to Face  ? ?Principal Problem: Cannabis-induced mood disorder (HCC) ?Discharge Diagnoses: Principal Problem: ?  Cannabis-induced mood disorder (HCC) ? ? ?Subjective: "  I am depressed, I stopped taking my Medications a while ago" ?Patient came in yesterday to the ER via Ambulance for bizarre behavior picked up from a gas station.  Today this patient was seen awake, alert and contributed to the assessment.  He reported feeling depressed and has been having crying spell.  He also reported going through a relationship that ended recently.He ended the relationship and stated that he is trying to get over the breakup.  Patient also admitted he smokes "weed" 3-4 blunts a day and that he has ben told by various people that this is not good for his Brain.  He reported that as a teenager he was depressed, was in therapy and taking Medications.  He named Zoloft and Olanzapine as medications he tried in the past.  He also is willing to get back on both medications.  He is interested in receiving car from [facility] and information is already in his discharge document.  He has not been sleeping, missed jobs for few days.  He want to go back to the job because he helps his mother pay bills.  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  He vehemently denied feeling suicidal and denied previous suicide attempt.   We discussed the need to avoid any use of Cannabis or cut down on use.  Patient is discharged. ? ?Total Time spent with patient: 30 minutes ? ?Past Psychiatric History: Cannabis use disorder, Psychosis, aggression, Multiple ER visits  ? ?Past Medical History:  ?Past Medical History:  ?Diagnosis Date  ? ADHD   ? Medical history non-contributory   ?  ?Past Surgical History:  ?Procedure Laterality Date  ? NO PAST SURGERIES    ? ?Family History:  ?Family History  ?Problem Relation Age of Onset  ? Healthy Mother   ? ?Family  Psychiatric  History: denied ?Social History:  ?Social History  ? ?Substance and Sexual Activity  ?Alcohol Use Yes  ? Comment: He states occassionally, reports last beer was when his brother died.   ?   ?Social History  ? ?Substance and Sexual Activity  ?Drug Use Yes  ? Types: Marijuana  ? Comment: Daily. Last used: yesterday   ?  ?Social History  ? ?Socioeconomic History  ? Marital status: Single  ?  Spouse name: Not on file  ? Number of children: Not on file  ? Years of education: Not on file  ? Highest education level: Not on file  ?Occupational History  ? Not on file  ?Tobacco Use  ? Smoking status: Every Day  ?  Packs/day: 1.00  ?  Years: 4.00  ?  Pack years: 4.00  ?  Types: Cigarettes, Cigars  ? Smokeless tobacco: Never  ?Vaping Use  ? Vaping Use: Never used  ?Substance and Sexual Activity  ? Alcohol use: Yes  ?  Comment: He states occassionally, reports last beer was when his brother died.   ? Drug use: Yes  ?  Types: Marijuana  ?  Comment: Daily. Last used: yesterday   ? Sexual activity: Yes  ?  Comment: uta, patient not fully oriented  ?Other Topics Concern  ? Not on file  ?Social History Narrative  ? Not on file  ? ?Social  Determinants of Health  ? ?Financial Resource Strain: Not on file  ?Food Insecurity: Not on file  ?Transportation Needs: Not on file  ?Physical Activity: Not on file  ?Stress: Not on file  ?Social Connections: Not on file  ? ? ?Tobacco Cessation:  N/A, patient does not currently use tobacco products ? ?Current Medications: ?Current Facility-Administered Medications  ?Medication Dose Route Frequency Provider Last Rate Last Admin  ? amLODipine (NORVASC) tablet 5 mg  5 mg Oral Daily Sherra Kimmons C, NP      ? OLANZapine zydis (ZYPREXA) disintegrating tablet 5 mg  5 mg Oral QHS Cherron Blitzer C, NP      ? sertraline (ZOLOFT) tablet 25 mg  25 mg Oral Daily Earney Navynuoha, Lynesha Bango C, NP      ? ?Current Outpatient Medications  ?Medication Sig Dispense Refill  ? OVER THE COUNTER MEDICATION Take  1 tablet by mouth every morning. Hydroxycut    ? amLODipine (NORVASC) 5 MG tablet Take 1 tablet (5 mg total) by mouth daily. (Patient not taking: Reported on 02/18/2022) 30 tablet 1  ? OLANZapine zydis (ZYPREXA) 5 MG disintegrating tablet Take 1 tablet (5 mg total) by mouth at bedtime. 30 tablet 0  ? sertraline (ZOLOFT) 25 MG tablet Take 1 tablet (25 mg total) by mouth daily. 30 tablet 0  ? ?PTA Medications: ?(Not in a hospital admission) ? ? ?Musculoskeletal: ?Strength & Muscle Tone: within normal limits ?Gait & Station: normal ?Patient leans: Front ? ?Psychiatric Specialty Exam: ? ?Presentation  ?General Appearance: Appropriate for Environment; Casual ?Eye Contact:Good ?Speech:Clear and Coherent; Normal Rate ?Speech Volume:Normal ?Handedness:Right ? ?Mood and Affect  ?Mood:Depressed ?Affect:Congruent ? ?Thought Process  ?Thought Processes:Coherent ?Descriptions of Associations:Intact ? ?Orientation:Full (Time, Place and Person) ? ?Thought Content:Logical ? ?History of Schizophrenia/Schizoaffective disorder:No ? ?Duration of Psychotic Symptoms:N/A ? ?Hallucinations:Hallucinations: None ? ?Ideas of Reference:None ? ?Suicidal Thoughts:Suicidal Thoughts: No ? ?Homicidal Thoughts:Homicidal Thoughts: No ? ? ?Sensorium  ?Memory:Immediate Good; Recent Good; Remote Fair ?Judgment:Fair ?Insight:Good ? ?Executive Functions  ?Concentration:Good ?Attention Span:Good ?Recall:Good ?Fund of Knowledge:Fair ?Language:Good ? ?Psychomotor Activity  ?Psychomotor Activity:Psychomotor Activity: Normal ? ?Assets  ?Assets:Communication Skills; Desire for Improvement; Housing; Health and safety inspectorinancial Resources/Insurance; Physical Health ? ?Sleep  ?Sleep:Sleep: Fair ? ? ?Physical Exam: ?Physical Exam ?Vitals and nursing note reviewed.  ?Constitutional:   ?   Appearance: Normal appearance.  ?HENT:  ?   Head: Normocephalic and atraumatic.  ?   Nose: Nose normal.  ?Cardiovascular:  ?   Rate and Rhythm: Normal rate and regular rhythm.  ?Musculoskeletal:     ?    General: Normal range of motion.  ?   Cervical back: Normal range of motion and neck supple.  ?Skin: ?   General: Skin is warm and dry.  ?Neurological:  ?   General: No focal deficit present.  ?   Mental Status: He is alert and oriented to person, place, and time.  ? ?Review of Systems  ?Constitutional: Negative.   ?HENT: Negative.    ?Eyes: Negative.   ?Respiratory: Negative.    ?Cardiovascular: Negative.   ?Gastrointestinal: Negative.   ?Genitourinary: Negative.   ?Skin: Negative.   ?Neurological: Negative.   ?Endo/Heme/Allergies: Negative.   ?Psychiatric/Behavioral:  Positive for depression.   ?Blood pressure (!) 157/100, pulse 75, temperature 99.4 ?F (37.4 ?C), temperature source Axillary, resp. rate 17, SpO2 99 %. There is no height or weight on file to calculate BMI. ? ? ?Demographic Factors:  ?Male, Adolescent or young adult, and Low socioeconomic status ? ?Loss Factors: ?Loss of significant  relationship ? ?Historical Factors: ?Family history of mental illness or substance abuse ? ?Risk Reduction Factors:   ?Sense of responsibility to family, Employed, Living with another person, especially a relative, and Positive therapeutic relationship ? ?Continued Clinical Symptoms:  ?Depression:   Insomnia ?Alcohol/Substance Abuse/Dependencies ?Previous Psychiatric Diagnoses and Treatments ? ?Cognitive Features That Contribute To Risk:  ?None   ? ?Suicide Risk:  ?Minimal: No identifiable suicidal ideation.  Patients presenting with no risk factors but with morbid ruminations; may be classified as minimal risk based on the severity of the depressive symptoms ? ? ? ?Plan Of Care/Follow-up recommendations: Follow up with Psychiatrist at [facility] , take Medications as prescribed. ?Activity:  as tolerated ?Diet:  regular ? ?Disposition: discharge home ?Earney Navy, NP-PMHNP-BC ?02/18/2022, 12:13 PM ? ?

## 2022-02-18 NOTE — Discharge Instructions (Signed)
[facility]-will provide timely access to mental health services for children and adolescents (4-17) and adults presenting in a mental health crisis. The program is designed for those who need urgent Behavioral Health or Substance Use treatment and are not experiencing a medical crisis that would typically require an emergency room visit.    [address] [place], [place] [address] Phone: [phone] [email or web address]   [facility] will also offer the following outpatient services: (Monday through Friday 8am-5pm)    Partial Hospitalization Program (PHP)  Substance Abuse Intensive Outpatient Program (SA-IOP)  Group Therapy  Medication Management  Peer Living Room   We also provide (24/7):    Assessments: Our mental health clinician and providers will conduct a focused mental health evaluation, assessing for immediate safety concerns and further mental health needs.   Referral: Our team will provide resources and help connect to community based mental health treatment, when indicated, including psychotherapy, psychiatry, and other specialized behavioral health or substance use disorder services (for those not already in treatment).   Transitional Care: Our team providers in person bridging and/or telphonic follow-up during the patient's transition to outpatient services.      

## 2022-02-18 NOTE — ED Notes (Signed)
Pt is now alert and answering all questions. Pt states he is here because he had an "episode" and is able to recall getting here. He denies Si/HI. Was updated regarding pending TTS ?
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
## 2022-02-18 NOTE — ED Provider Notes (Signed)
?  Physical Exam  ?BP (!) 157/100 (BP Location: Left Arm) Comment: nurse advised  Pulse 75   Temp 99.4 ?F (37.4 ?C) (Axillary)   Resp 17   SpO2 99%  ? ?Physical Exam ? ?Procedures  ?Procedures ? ?ED Course / MDM  ?  ?Medical Decision Making ?Amount and/or Complexity of Data Reviewed ?Labs: ordered. ?Radiology: ordered. ? ?Risk ?Prescription drug management. ? ? ?Received care from previous providers.  Please see notes for prior hx and care. Briefly, this is 20yo here for bizarre behavior. Was evaluated by psychiatry and is now stabilized and cleared for outpatient follow up. He as previously rx amlodipine for htn, did have some values 150s, will renew rx per request and psychiatry provided other rx. Denies SI/HI. Patient discharged in stable condition with understanding of reasons to return.  ? ? ? ? ?  ?Gareth Morgan, MD ?02/19/22 [id number] ? ?

## 2022-02-18 NOTE — ED Notes (Signed)
TTS at bedside. 

## 2022-04-07 ENCOUNTER — Other Ambulatory Visit: Payer: Self-pay

## 2022-04-07 ENCOUNTER — Emergency Department (HOSPITAL_COMMUNITY)
Admission: EM | Admit: 2022-04-07 | Discharge: 2022-04-08 | Disposition: A | Discharge status: Home / Self Care | Payer: Medicaid Other | Attending: Emergency Medicine | Admitting: Emergency Medicine

## 2022-04-07 ENCOUNTER — Encounter (HOSPITAL_COMMUNITY): Payer: Self-pay | Admitting: *Deleted

## 2022-04-07 DIAGNOSIS — R4689 Other symptoms and signs involving appearance and behavior: Secondary | ICD-10-CM

## 2022-04-07 DIAGNOSIS — R462 Strange and inexplicable behavior: Secondary | ICD-10-CM | POA: Insufficient documentation

## 2022-04-07 DIAGNOSIS — E876 Hypokalemia: Secondary | ICD-10-CM | POA: Insufficient documentation

## 2022-04-07 DIAGNOSIS — R748 Abnormal levels of other serum enzymes: Secondary | ICD-10-CM | POA: Diagnosis not present

## 2022-04-07 DIAGNOSIS — Y900 Blood alcohol level of less than 20 mg/100 ml: Secondary | ICD-10-CM | POA: Insufficient documentation

## 2022-04-07 DIAGNOSIS — Z046 Encounter for general psychiatric examination, requested by authority: Secondary | ICD-10-CM | POA: Diagnosis present

## 2022-04-07 DIAGNOSIS — R000 Tachycardia, unspecified: Secondary | ICD-10-CM | POA: Insufficient documentation

## 2022-04-07 LAB — COMPREHENSIVE METABOLIC PANEL
ALT: 28 U/L (ref 0–44)
AST: 41 U/L (ref 15–41)
Albumin: 4.7 g/dL (ref 3.5–5.0)
Alkaline Phosphatase: 56 U/L (ref 38–126)
Anion gap: 15 (ref 5–15)
BUN: 15 mg/dL (ref 6–20)
CO2: 20 mmol/L — ABNORMAL LOW (ref 22–32)
Calcium: 9.5 mg/dL (ref 8.9–10.3)
Chloride: 101 mmol/L (ref 98–111)
Creatinine, Ser: 1.54 mg/dL — ABNORMAL HIGH (ref 0.61–1.24)
GFR, Estimated: >60 mL/min (ref >60)
Glucose, Bld: 108 mg/dL — ABNORMAL HIGH (ref 70–99)
Potassium: 2.9 mmol/L — ABNORMAL LOW (ref 3.5–5.1)
Sodium: 136 mmol/L (ref 135–145)
Total Bilirubin: 1.9 mg/dL — ABNORMAL HIGH (ref 0.3–1.2)
Total Protein: 7.9 g/dL (ref 6.5–8.1)

## 2022-04-07 LAB — CBC
HCT: 47.1 % (ref 39.0–52.0)
Hemoglobin: 15.4 g/dL (ref 13.0–17.0)
MCH: 28.4 pg (ref 26.0–34.0)
MCHC: 32.7 g/dL (ref 30.0–36.0)
MCV: 86.9 fL (ref 80.0–100.0)
Platelets: 210 10*3/uL (ref 150–400)
RBC: 5.42 MIL/uL (ref 4.22–5.81)
RDW: 12 % (ref 11.5–15.5)
WBC: 8.9 10*3/uL (ref 4.0–10.5)
nRBC: 0 % (ref 0.0–0.2)

## 2022-04-07 LAB — ETHANOL: Alcohol, Ethyl (B): <10 mg/dL (ref <10)

## 2022-04-07 LAB — CK: Total CK: 1225 U/L — ABNORMAL HIGH (ref 49–397)

## 2022-04-07 MED ORDER — POTASSIUM CHLORIDE CRYS ER 20 MEQ PO TBCR
40.0000 meq | EXTENDED_RELEASE_TABLET | Freq: Once | ORAL | Status: AC
Start: 2022-04-07 — End: 2022-04-07
  Administered 2022-04-07: 40 meq via ORAL
  Filled 2022-04-07: qty 2

## 2022-04-07 MED ORDER — LORAZEPAM 2 MG/ML IJ SOLN
0.5000 mg | Freq: Once | INTRAMUSCULAR | Status: AC
  Administered 2022-04-07: 0.5 mg via INTRAVENOUS
  Filled 2022-04-07: qty 1

## 2022-04-07 MED ORDER — SODIUM CHLORIDE 0.9 % IV BOLUS
1000.0000 mL | Freq: Once | INTRAVENOUS | Status: AC
  Administered 2022-04-07: 1000 mL via INTRAVENOUS

## 2022-04-07 MED ORDER — POTASSIUM CHLORIDE 10 MEQ/100ML IV SOLN
10.0000 meq | Freq: Once | INTRAVENOUS | Status: AC
  Administered 2022-04-07: 10 meq via INTRAVENOUS
  Filled 2022-04-07: qty 100

## 2022-04-07 NOTE — Discharge Instructions (Signed)
Get help right away if you feel like hurting yourself or others. ?

## 2022-04-07 NOTE — ED Triage Notes (Signed)
The pt  [facility]rived [facility]  they were called for strange behavior  he is constantly repeating a jingle he refused to allow the paramedics to do an iv  and he has bursts of almost runnningACROSS  HIS HEART RATE WAS 150  HISTORY OF PSY problems  family may or may not come to the ed  he had 2 shots of alcohol earlier today ?

## 2022-04-07 NOTE — ED Notes (Signed)
E-signature pad unavailable at time of pt discharge. This RN discussed discharge materials with pt and answered all pt questions. Pt stated understanding of discharge material. ? ?

## 2022-04-07 NOTE — ED Provider Notes (Signed)
?[facility] EMERGENCY DEPARTMENT ?Provider Note ? ? ?CSN: [id number] ?Arrival date & time: 04/07/22  1800 ? ?  ? ?History ? ?Chief Complaint  ?Patient presents with  ? Tachycardia  ? Psychiatric Evaluation  ? ? ?Gabriel Nelson is a 21 y.o. male patient who was brought in by ems for bizarre behavior and tachycardia the patient reports that he has no idea why he is here.  He states he does not really remember coming in but he does know that he was up all night using drugs. He said that it was mostly Marijuana but thinks " there might have been something in it" He denies SI, HI, AVH. He is alert and oriented to person, place , and time. ?He has been seen for psychotic behavior in the past. ? ?HPI ? ?  ? ?Home Medications ?Prior to Admission medications   ?Medication Sig Start Date End Date Taking? Authorizing Provider  ?amLODipine (NORVASC) 5 MG tablet Take 1 tablet (5 mg total) by mouth daily. 02/18/22 04/07/22 Yes Alvira Monday, MD  ?OLANZapine zydis (ZYPREXA) 5 MG disintegrating tablet Take 1 tablet (5 mg total) by mouth at bedtime. 02/18/22 04/07/22 Yes Earney Navy, NP  ?OVER THE COUNTER MEDICATION Take 1 tablet by mouth See admin instructions. Hydroxycut on days when working out   Yes [provider]  ?sertraline (ZOLOFT) 25 MG tablet Take 1 tablet (25 mg total) by mouth daily. 02/18/22 04/07/22 Yes Earney Navy, NP  ?   ? ?Allergies    ?Patient has no known allergies.   ? ?Review of Systems   ?Review of Systems ? ?Physical Exam ?Updated Vital Signs ?BP (!) 125/96   Pulse 72   Temp 98.3 ?F (36.8 ?C) (Oral)   Resp (!) 25   Ht 5\' 10"  (1.778 m)   Wt 85.7 kg   SpO2 100%   BMI 27.11 kg/m?  ?Physical Exam ?Vitals and nursing note reviewed.  ?Constitutional:   ?   General: He is not in acute distress. ?   Appearance: He is well-developed. He is not diaphoretic.  ?HENT:  ?   Head: Normocephalic and atraumatic.  ?Eyes:  ?   General: No scleral icterus. ?   Conjunctiva/sclera:  Conjunctivae normal.  ?Cardiovascular:  ?   Rate and Rhythm: Normal rate and regular rhythm.  ?   Heart sounds: Normal heart sounds.  ?Pulmonary:  ?   Effort: Pulmonary effort is normal. No respiratory distress.  ?   Breath sounds: Normal breath sounds.  ?Abdominal:  ?   Palpations: Abdomen is soft.  ?   Tenderness: There is no abdominal tenderness.  ?Musculoskeletal:  ?   Cervical back: Normal range of motion and neck supple.  ?Skin: ?   General: Skin is warm and dry.  ?Neurological:  ?   Mental Status: He is alert and oriented to person, place, and time.  ?Psychiatric:     ?   Mood and Affect: Mood normal.     ?   Behavior: Behavior normal.     ?   Thought Content: Thought content normal.     ?   Judgment: Judgment normal.  ? ? ?ED Results / Procedures / Treatments   ?Labs ?(all labs ordered are listed, but only abnormal results are displayed) ?Labs Reviewed  ?COMPREHENSIVE METABOLIC PANEL - Abnormal; Notable for the following components:  ?    Result Value  ? Potassium 2.9 (*)   ? CO2 20 (*)   ? Glucose, Bld  108 (*)   ? Creatinine, Ser 1.54 (*)   ? Total Bilirubin 1.9 (*)   ? All other components within normal limits  ?CK - Abnormal; Notable for the following components:  ? Total CK 1,225 (*)   ? All other components within normal limits  ?ETHANOL  ?CBC  ?RAPID URINE DRUG SCREEN, HOSP PERFORMED  ? ? ?EKG ?EKG Interpretation ? ?Date/Time:  Saturday Apr 07 2022 18:10:53 EDT ?Ventricular Rate:  134 ?PR Interval:  138 ?QRS Duration: 82 ?QT Interval:  300 ?QTC Calculation: 448 ?R Axis:   118 ?Text Interpretation: Sinus tachycardia with Premature atrial complexes Anterolateral infarct , age undetermined Abnormal ECG When compared with ECG of 06-Feb-2021 19:47, Since last tracing rate faster Confirmed by Richardean Canal [phone]) on 04/07/2022 8:59:10 PM ? ?Radiology ?No results found. ? ?Procedures ?Procedures  ? ? ?Medications Ordered in ED ?Medications  ?potassium chloride 10 mEq in 100 mL IVPB (10 mEq Intravenous New  Bag/Given 04/07/22 2136)  ?potassium chloride SA (KLOR-CON M) CR tablet 40 mEq (40 mEq Oral Given 04/07/22 2134)  ?sodium chloride 0.9 % bolus 1,000 mL (1,000 mLs Intravenous New Bag/Given 04/07/22 2136)  ?LORazepam (ATIVAN) injection 0.5 mg (0.5 mg Intravenous Given 04/07/22 2133)  ? ? ?ED Course/ Medical Decision Making/ A&P ?  ?                        ?Medical Decision Making ?Patient sent in for bizarre behavior.  UDS pending.  He was notably hypokalemic, tachycardic with elevated CK.  He denies using methamphetamine however his clinical picture seems pretty persistent with this.  He does use marijuana.  Patient has not been acting bizarrely, he is alert and oriented.  He is unsure why he is here.  He is asked to eat food.  He has fairly normal thought content and judgment.  He does not eyes feeling suicidal or homicidal and denies audiovisual hallucinations.  At this point after fluid resuscitation and potassium I think he can be discharged back to the care of his family. ? ?Amount and/or Complexity of Data Reviewed ?Labs: ordered. ? ?Risk ?Prescription drug management. ? ? ?king why or why not admission, treatments were needed:1} ?Final Clinical Impression(s) / ED Diagnoses ?Final diagnoses:  ?Elevated CK  ?Abnormal behavior  ?Hypokalemia  ? ? ?Rx / DC Orders ?ED Discharge Orders   ? ? None  ? ?  ? ? ?  ?Arthor Captain, PA-C ?04/07/22 2355 ? ?  ?Charlynne Pander, MD ?04/08/22 1454 ? ?

## 2022-04-08 ENCOUNTER — Other Ambulatory Visit: Payer: Self-pay

## 2022-04-08 ENCOUNTER — Encounter (HOSPITAL_COMMUNITY): Payer: Self-pay

## 2022-04-08 ENCOUNTER — Emergency Department (HOSPITAL_COMMUNITY)
Admission: EM | Admit: 2022-04-08 | Discharge: 2022-04-09 | Disposition: A | Discharge status: Home / Self Care | Payer: Medicaid Other | Attending: Emergency Medicine | Admitting: Emergency Medicine

## 2022-04-08 DIAGNOSIS — Z20822 Contact with and (suspected) exposure to covid-19: Secondary | ICD-10-CM | POA: Diagnosis not present

## 2022-04-08 DIAGNOSIS — F39 Unspecified mood [affective] disorder: Secondary | ICD-10-CM | POA: Diagnosis present

## 2022-04-08 DIAGNOSIS — Z79899 Other long term (current) drug therapy: Secondary | ICD-10-CM | POA: Insufficient documentation

## 2022-04-08 DIAGNOSIS — F12288 Cannabis dependence with other cannabis-induced disorder: Secondary | ICD-10-CM | POA: Diagnosis not present

## 2022-04-08 DIAGNOSIS — Z046 Encounter for general psychiatric examination, requested by authority: Secondary | ICD-10-CM | POA: Diagnosis not present

## 2022-04-08 DIAGNOSIS — Y900 Blood alcohol level of less than 20 mg/100 ml: Secondary | ICD-10-CM | POA: Diagnosis not present

## 2022-04-08 DIAGNOSIS — R451 Restlessness and agitation: Secondary | ICD-10-CM | POA: Diagnosis not present

## 2022-04-08 DIAGNOSIS — F12988 Cannabis use, unspecified with other cannabis-induced disorder: Secondary | ICD-10-CM | POA: Diagnosis not present

## 2022-04-08 HISTORY — DX: Essential (primary) hypertension: I10

## 2022-04-08 HISTORY — DX: Depression, unspecified: F32.A
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
## 2022-04-08 NOTE — ED Notes (Signed)
Pt arrived to ED without shirt, only shoes, boxers, and shorts. Disposable shirt provided upon discharge.  ?

## 2022-04-09 ENCOUNTER — Encounter (HOSPITAL_COMMUNITY): Payer: Self-pay | Admitting: Registered Nurse

## 2022-04-09 DIAGNOSIS — Z046 Encounter for general psychiatric examination, requested by authority: Secondary | ICD-10-CM | POA: Insufficient documentation

## 2022-04-09 DIAGNOSIS — F39 Unspecified mood [affective] disorder: Secondary | ICD-10-CM

## 2022-04-09 DIAGNOSIS — F12988 Cannabis use, unspecified with other cannabis-induced disorder: Secondary | ICD-10-CM

## 2022-04-09 LAB — COMPREHENSIVE METABOLIC PANEL
ALT: 29 U/L (ref 0–44)
AST: 49 U/L — ABNORMAL HIGH (ref 15–41)
Albumin: 4.4 g/dL (ref 3.5–5.0)
Alkaline Phosphatase: 52 U/L (ref 38–126)
Anion gap: 10 (ref 5–15)
BUN: 14 mg/dL (ref 6–20)
CO2: 23 mmol/L (ref 22–32)
Calcium: 9.3 mg/dL (ref 8.9–10.3)
Chloride: 103 mmol/L (ref 98–111)
Creatinine, Ser: 1.24 mg/dL (ref 0.61–1.24)
GFR, Estimated: >60 mL/min (ref >60)
Glucose, Bld: 83 mg/dL (ref 70–99)
Potassium: 3.3 mmol/L — ABNORMAL LOW (ref 3.5–5.1)
Sodium: 136 mmol/L (ref 135–145)
Total Bilirubin: 1.5 mg/dL — ABNORMAL HIGH (ref 0.3–1.2)
Total Protein: 7.6 g/dL (ref 6.5–8.1)

## 2022-04-09 LAB — CBC WITH DIFFERENTIAL/PLATELET
Abs Immature Granulocytes: 0.02 10*3/uL (ref 0.00–0.07)
Basophils Absolute: 0.1 10*3/uL (ref 0.0–0.1)
Basophils Relative: 1 %
Eosinophils Absolute: 0 10*3/uL (ref 0.0–0.5)
Eosinophils Relative: 0 %
HCT: 43.5 % (ref 39.0–52.0)
Hemoglobin: 14.4 g/dL (ref 13.0–17.0)
Immature Granulocytes: 0 %
Lymphocytes Relative: 32 %
Lymphs Abs: 2.4 10*3/uL (ref 0.7–4.0)
MCH: 28.7 pg (ref 26.0–34.0)
MCHC: 33.1 g/dL (ref 30.0–36.0)
MCV: 86.8 fL (ref 80.0–100.0)
Monocytes Absolute: 0.9 10*3/uL (ref 0.1–1.0)
Monocytes Relative: 12 %
Neutro Abs: 4.1 10*3/uL (ref 1.7–7.7)
Neutrophils Relative %: 55 %
Platelets: 180 10*3/uL (ref 150–400)
RBC: 5.01 MIL/uL (ref 4.22–5.81)
RDW: 12 % (ref 11.5–15.5)
WBC: 7.4 10*3/uL (ref 4.0–10.5)
nRBC: 0 % (ref 0.0–0.2)

## 2022-04-09 LAB — RESP PANEL BY RT-PCR (FLU A&B, COVID) ARPGX2
Influenza A by PCR: NEGATIVE
Influenza B by PCR: NEGATIVE
SARS Coronavirus 2 by RT PCR: NEGATIVE

## 2022-04-09 LAB — RAPID URINE DRUG SCREEN, HOSP PERFORMED
Amphetamines: NOT DETECTED
Barbiturates: NOT DETECTED
Benzodiazepines: NOT DETECTED
Cocaine: NOT DETECTED
Opiates: NOT DETECTED
Tetrahydrocannabinol: POSITIVE — AB

## 2022-04-09 LAB — ETHANOL: Alcohol, Ethyl (B): <10 mg/dL (ref <10)

## 2022-04-09 LAB — ACETAMINOPHEN LEVEL: Acetaminophen (Tylenol), Serum: <10 ug/mL — ABNORMAL LOW (ref 10–30)

## 2022-04-09 LAB — SALICYLATE LEVEL: Salicylate Lvl: <7 mg/dL — ABNORMAL LOW (ref 7.0–30.0)

## 2022-04-09 MED ORDER — STERILE WATER FOR INJECTION IJ SOLN
INTRAMUSCULAR | Status: AC
  Administered 2022-04-09: 1.2 mL via INTRAMUSCULAR
  Filled 2022-04-09: qty 10

## 2022-04-09 MED ORDER — ZIPRASIDONE MESYLATE 20 MG IM SOLR
INTRAMUSCULAR | Status: AC
  Administered 2022-04-09: 20 mg via INTRAMUSCULAR
  Filled 2022-04-09: qty 20

## 2022-04-09 MED ORDER — STERILE WATER FOR INJECTION IJ SOLN
INTRAMUSCULAR | Status: DC
Start: 2022-04-09 — End: 2022-04-09
  Filled 2022-04-09: qty 10

## 2022-04-09 MED ORDER — STERILE WATER FOR INJECTION IJ SOLN
INTRAMUSCULAR | Status: AC
  Administered 2022-04-09: 1.2 mL
  Filled 2022-04-09: qty 10

## 2022-04-09 MED ORDER — ZIPRASIDONE MESYLATE 20 MG IM SOLR
20.0000 mg | Freq: Once | INTRAMUSCULAR | Status: AC
  Administered 2022-04-09: 20 mg via INTRAMUSCULAR
  Filled 2022-04-09: qty 20

## 2022-04-09 MED ORDER — ZIPRASIDONE MESYLATE 20 MG IM SOLR
INTRAMUSCULAR | Status: AC
  Filled 2022-04-09: qty 20

## 2022-04-09 MED ORDER — ZIPRASIDONE MESYLATE 20 MG IM SOLR: 20.0000 mg | Freq: Once | INTRAMUSCULAR | Status: AC

## 2022-04-09 NOTE — Discharge Instructions (Signed)
Substance Abuse Treatment Programs  Intensive Outpatient Programs [facility]     [address]      [place], [place]                   [phone]       [facility] [address] [place], [place] [phone]  [facility]     (Inpatient and outpatient)     [address]           [phone]    [facility] [phone] (Suboxone and Methadone)  [address]      [place], [place] [address]      [phone]       [address] [place], [place] [phone]  [facility] (Outpatient/Inpatient, [facility])    (insurance only) [phone]             [facility] (Groups & Residential) [place], [place] [phone]     [facility]     [address]     [place], [place]      [phone]       [facility] (for caregivers and family) [address] [place], [place] [phone]      [facility]idential [facility]      [address], [place], [place] [address]  [phone]       [facility] [address] [place], [place] [address] [phone]  [facility]        [phone]       [facility] [phone]  [facility]             [address]                                         [place], [place]                                                [phone] or [phone]                               [facility] [place] [address] [place], [address] [phone]  [facility]    [address]      [place], [place]     [phone]       [facility] [address] [place], [place] [phone]  [facility]   [address]     [place], [place] [address]     [phone]      Admissions: 8am-3pm M-F  [facility] ([facility]) [address] [place],  [place] [phone]  [facility]: Residential Program (90 Days)   [place], [place]      [phone] or [phone]     [facility]: [facility] [place], [place] (Walk in Hours over the weekend or by referral)  [place] [facility] [address], [place],  [address] [phone]  Crisis Mobile: [facility]:  1-877-626-1772 (for crisis response 24 hours a day) [facility] Hotline:      1-800-256-2452 Outpatient Psychiatry and Counseling  [facility]: Mobile Crisis   Alternatives: Mobile Crisis Management 24 hours:  [phone] ? ?[facility] [employer] sliding scale fee and walk in schedule: M-F 8am-12pm/1pm-3pm ?[address]  ?[place], [place] [address] ?[phone] ? ?[facility] ?[address] ?[place], [place] [address] ?[phone] ? ?[facility] new patient walk-in appointments available Monday - Friday 8am -3pm.          ?[address] ?[place], [place] [address] ?[phone] or crisis line- [phone] ? ?[facility]/ Intensive Outpatient Therapy Program ?[address] ?[place], [place] [address] ?[phone] ? ?[facility]                  ?Crisis Services      ?[phone]      ?[address]     ?[place], [place] [address]                ? ?[facility]   ?[facility] ?[phone] ?[address] ?[place], [place] [address] ? ? ?[facility]          ?[address],  ?[place], [place] [address]       ?[phone] ? ?[facility] ?[address] ?[place], [place] [address] ?[phone] ? ?[facility]    ?[address]    ?[place], [place] [address]     ?[phone]      ? ?Andee Poles, MD     ?[phone]     ?[place] [address]     ?[phone]     ?  ?[facility] ?[address] ?[place] [address] ? ?[facility]     ?[address]     ?[place], [place]      ?[phone]      ? ?[facility] ?Eulogio Ditch, MD ?[address] ?[place], [place] [address] ?[phone] ? ?[facility]     ?[address] [phone]     ?[place], [place] [address]     ?[phone]      ? ?[facility] ?[address] [place], [place] [address] ?[phone] ?Resources for Temporary Residential Assistance/Crisis Centers ? ?[facility] ?[facility]) ?M-F 8am-3pm   ?[address] [place], [place] [address]   [phone] ?Services include: laundry, barbering, support groups, case management, phone  & computer access, showers, AA/NA mtgs, mental health/substance abuse nurse, job skills class, disability information, VA assistance, spiritual classes, etc.  ? ?[facility] ? ?[employer]     ?[facility]   ?[address] [place]     ?[phone]       ?       ?[facility] (women and children)       ?[address] [place], [place] [address] ?[phone] ?[email or web address] for application and process ?Application Required ? ?[facility]   ?[address]    ?[place] [address]     ?[phone]       ?             ?[facility] ?[address] ?[place], [place] [address] ?[phone] ?[phone](VBTYOMAY application appt.) ?Application Required ? ?[facility] (women only)    ?[address]     ?[place], [place] [address]     ?[phone]      ?  Intake starts 6pm daily Need valid ID, SSC, & Police report [facility] [place] [address] [place], [place] [phone] Application Required  [facility] (men only)     [address]      [place], [place]     [phone]       [facility] (Pregnant women only) [address] [place], [place] [phone]  [facility]      [address]      [place], [place] [address]     [phone]             [place] [facility] [address] [place], [place] [phone] 90 day commitment/SA/Application process  [facility](men only)     [address]     [place], [place]     [phone]       Check-in at 7pm            [facility] [address] [place], [place] [address] [phone] Men/Women/Women and Children must be there by 7 pm  [facility] [place], [place] [phone]                  Suicide Resources  Who to Call Call 911 National Suicide Prevention Hotline - Dial 988   [facility] at (336) 832-9700; (800) 711-2638  More Resources Suicide Awareness Voices of Education       (952) 946-7998        www.save.org National Alliance on Mental Illness(NAMI)       (800) 950-NAMI        www.nami.org American Association of Suicidology       (202) 237-2280        www.suicidology.org 

## 2022-04-09 NOTE — ED Notes (Signed)
Pt awake, exited room and asked this RN if he could leave. Pt able to be redirected, complied w/ vital signs. Educated pt on IVC process ?

## 2022-04-09 NOTE — ED Notes (Signed)
Patient walks around room. Shadow boxes wall and stands in doorway. Expresses violent feelings, but is redirectable and cooperative otherwise. ?
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
## 2022-04-09 NOTE — ED Notes (Signed)
Sitting up eating lunch tray. Tolerating well. No complaints. Calm, cooperative.  ?

## 2022-04-09 NOTE — ED Notes (Signed)
Resting with eyes closed. No signs of distress.
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
## 2022-04-09 NOTE — ED Notes (Signed)
Calm, cooperative. No complaints.  ?

## 2022-04-09 NOTE — ED Notes (Signed)
Pt walked out of room and down the hall attempting to leave. Pt stating he needs to go to work at 11. Pt redirected back to his room  ?
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
## 2022-04-09 NOTE — ED Notes (Signed)
Breakfast order placed ?

## 2022-04-09 NOTE — ED Provider Notes (Signed)
Patient has been cleared by psychiatry.  Feel this is more of an drug-induced mood disorder.  He is not altered, psychotic, suicidal or homicidal at the time I am talking to him.  Will rescind IVC and discharge. ?  ?Sherwood Gambler, MD ?04/09/22 1514 ? ?

## 2022-12-07 ENCOUNTER — Other Ambulatory Visit: Payer: Self-pay

## 2022-12-07 ENCOUNTER — Emergency Department (HOSPITAL_COMMUNITY)
Admission: EM | Admit: 2022-12-07 | Discharge: 2022-12-07 | Disposition: A | Discharge status: Other Acute Inpatient Hospital | Payer: Medicaid Other | Attending: Student | Admitting: Student

## 2022-12-07 ENCOUNTER — Encounter (HOSPITAL_COMMUNITY): Payer: Self-pay

## 2022-12-07 DIAGNOSIS — Z79899 Other long term (current) drug therapy: Secondary | ICD-10-CM | POA: Diagnosis not present

## 2022-12-07 DIAGNOSIS — R462 Strange and inexplicable behavior: Secondary | ICD-10-CM | POA: Diagnosis not present

## 2022-12-07 DIAGNOSIS — F29 Unspecified psychosis not due to a substance or known physiological condition: Secondary | ICD-10-CM | POA: Diagnosis not present

## 2022-12-07 DIAGNOSIS — I10 Essential (primary) hypertension: Secondary | ICD-10-CM | POA: Insufficient documentation

## 2022-12-07 DIAGNOSIS — Z1152 Encounter for screening for COVID-19: Secondary | ICD-10-CM | POA: Insufficient documentation

## 2022-12-07 DIAGNOSIS — F4323 Adjustment disorder with mixed anxiety and depressed mood: Secondary | ICD-10-CM

## 2022-12-07 DIAGNOSIS — F32A Depression, unspecified: Secondary | ICD-10-CM | POA: Insufficient documentation

## 2022-12-07 HISTORY — DX: Adjustment disorder with mixed anxiety and depressed mood: F43.23

## 2022-12-07 LAB — URINALYSIS, ROUTINE W REFLEX MICROSCOPIC
Bilirubin Urine: NEGATIVE
Glucose, UA: NEGATIVE mg/dL
Hgb urine dipstick: NEGATIVE
Ketones, ur: 80 mg/dL — AB
Nitrite: NEGATIVE
Protein, ur: 100 mg/dL — AB
Specific Gravity, Urine: 1.036 — ABNORMAL HIGH (ref 1.005–1.030)
WBC, UA: >50 WBC/hpf — ABNORMAL HIGH (ref 0–5)
pH: 5 (ref 5.0–8.0)

## 2022-12-07 LAB — RESP PANEL BY RT-PCR (RSV, FLU A&B, COVID)  RVPGX2
Influenza A by PCR: NEGATIVE
Influenza B by PCR: NEGATIVE
Resp Syncytial Virus by PCR: NEGATIVE
SARS Coronavirus 2 by RT PCR: NEGATIVE

## 2022-12-07 LAB — RAPID URINE DRUG SCREEN, HOSP PERFORMED
Amphetamines: NOT DETECTED
Barbiturates: NOT DETECTED
Benzodiazepines: NOT DETECTED
Cocaine: NOT DETECTED
Opiates: NOT DETECTED
Tetrahydrocannabinol: POSITIVE — AB

## 2022-12-07 LAB — CBC
HCT: 45.4 % (ref 39.0–52.0)
Hemoglobin: 14.6 g/dL (ref 13.0–17.0)
MCH: 28.7 pg (ref 26.0–34.0)
MCHC: 32.2 g/dL (ref 30.0–36.0)
MCV: 89.2 fL (ref 80.0–100.0)
Platelets: 206 10*3/uL (ref 150–400)
RBC: 5.09 MIL/uL (ref 4.22–5.81)
RDW: 12.3 % (ref 11.5–15.5)
WBC: 8.7 10*3/uL (ref 4.0–10.5)
nRBC: 0 % (ref 0.0–0.2)

## 2022-12-07 LAB — BASIC METABOLIC PANEL
Anion gap: 12 (ref 5–15)
BUN: 17 mg/dL (ref 6–20)
CO2: 19 mmol/L — ABNORMAL LOW (ref 22–32)
Calcium: 9.3 mg/dL (ref 8.9–10.3)
Chloride: 106 mmol/L (ref 98–111)
Creatinine, Ser: 1.06 mg/dL (ref 0.61–1.24)
GFR, Estimated: >60 mL/min (ref >60)
Glucose, Bld: 96 mg/dL (ref 70–99)
Potassium: 3.9 mmol/L (ref 3.5–5.1)
Sodium: 137 mmol/L (ref 135–145)

## 2022-12-07 LAB — SALICYLATE LEVEL: Salicylate Lvl: <7 mg/dL — ABNORMAL LOW (ref 7.0–30.0)

## 2022-12-07 LAB — ETHANOL: Alcohol, Ethyl (B): <10 mg/dL (ref <10)

## 2022-12-07 LAB — ACETAMINOPHEN LEVEL: Acetaminophen (Tylenol), Serum: <10 ug/mL — ABNORMAL LOW (ref 10–30)

## 2022-12-07 MED ORDER — OLANZAPINE 5 MG PO TBDP
5.0000 mg | ORAL_TABLET | ORAL | Status: AC
  Administered 2022-12-07: 5 mg via ORAL

## 2022-12-07 MED ORDER — OLANZAPINE 5 MG PO TBDP
5.0000 mg | ORAL_TABLET | Freq: Every day | ORAL | Status: DC
  Filled 2022-12-07: qty 1

## 2022-12-07 NOTE — Consult Note (Cosign Needed Addendum)
[facility] ED ASSESSMENT   Reason for Consult:  Psych Consult Referring Physician:  Genevive Bi, PA-C Patient Identification: Gabriel Nelson MRN:  [id number] ED Chief Complaint: Psychosis [facility])  Diagnosis:  Principal Problem:   Psychosis ([place]) Active Problems:   Adjustment disorder with mixed anxiety and depressed mood   ED Assessment Time Calculation: Start Time: 1415 Stop Time: 1500 Total Time in Minutes (Assessment Completion): 45   HPI:  Per Triage Note "Pt c/o depression, states he does not know how long he has been feeling like this. Pt denies SI, denies HI. Pt states home and work are fine, he just doesn't feel like himself. Pt denies drugs, denies alcohol. Pt not making eye contact, slow to answer questions".     Subjective:  Gabriel Nelson, 22 y.o., male patient seen face to face by this provider, consulted with Dr. Dwyane Dee; and chart reviewed on 12/07/22.  During evaluation Gabriel Nelson is sitting in hospital chair in no acute distress.  Provider is attempting to ask patient questions, and he is looking over to his sister.  His sister Gabriel Nelson is in the room with the patient, per sister patient has not been very responsive stated that it started at 10:00 this morning, he was talking on the phone to a friend and talking about his ex-girlfriend who overdosed and passed away in 10/25/22, said that while he was on the phone with the friend, he was laughing and joking, and then he became tearful and started balling himself up and then not responding.  Patient began rocking and gasping for air like he was trying to catch his breath.  Sister says that patient lives with her and another brother.  As provider is talking with the sister, patient looks as if he wants to say something but is not able to, in the beginning he was not even able to shake his head to yes and no questions.  Sister also stated that he came to the hospital in early 2023 due to the same nonresponsive behavior, but she  says he was also agitated and aggressive. Gabriel Nelson states that patient was in an inpatient facility almost 2 years ago and then diagnosed with schizophrenia and mania she thinks, she says he takes sertraline and Zyprexa but he is not compliant with medications because he says they make him feel like a zombie per sister.  After talking with sister patient begins to try and speak he stated that he does not drink a lot of alcohol, but smokes THC.  Patient was able to tell provider that he has a job and he works in Production designer, theatre/television/film.  When provider asked if he has an ACT team he said yes [facility], when asked if he has a Social worker he stated my brother, provider asked if his brother was a Social worker and he said "oh, no sorry ".  When provider asked patient how he was feeling all he was able to say was "I do not feel like myself I feel frustrated, down ".  Patient able to deny SI/HI/AVH.  Patient's sister says that his appetite is good and he eats about 2-3 meals a day, but he has not slept in 4 days and his sleep is poor.  He is alert, partially oriented, calm, cooperative. His mood is depressed with flat affect.  His behavior is appropriate, his speech is low in tone, and it takes him a very long time to respond.  Patient does appear to be preoccupied or distracted as provider  and sister are talking.  Patient appears to have a confused look on his face.  He denies suicidal/self-harm/homicidal ideation, psychosis, and paranoia.    Consulted with Dr. Lucianne Muss and she agreed with decision.   Past Psychiatric History: Patient has a history of altered mental status, and psychosis, and bizarre behavior  Risk to Self or Others: Is the patient at risk to self? No Has the patient been a risk to self in the past 6 months? No Has the patient been a risk to self within the distant past? No Is the patient a risk to others? No Has the patient been a risk to others in the past 6 months? No Has the patient been a risk to others  within the distant past? No  [place] Scale:  Flowsheet Row ED from 12/07/2022 in [facility] from 04/08/2022 in [facility]ERGENCY DEPART[facility] ED from 04/07/2022 in [facility] EMERGENCY DEPART[facility]  C-SSRS RISK CATEGORY No Risk Low Risk No Risk       AIMS:  , , ,  ,   ASAM:    Substance Abuse:     Past Medical History:  Past Medical History:  Diagnosis Date   ADHD    Depression    Hypertension    Medical history non-contributory     Past Surgical History:  Procedure Laterality Date   NO PAST SURGERIES     Family History:  Family History  Problem Relation Age of Onset   Healthy Mother     Social History:  Social History   Substance and Sexual Activity  Alcohol Use Yes   Comment: He states occassionally, reports last beer was when his brother died.      Social History   Substance and Sexual Activity  Drug Use Yes   Types: Marijuana   Comment: Daily. Last used: yesterday     Social History   Socioeconomic History   Marital status: Single    Spouse name: Not on file   Number of children: Not on file   Years of education: Not on file   Highest education level: Not on file  Occupational History   Not on file  Tobacco Use   Smoking status: Every Day    Packs/day: 1.00    Years: 4.00    Total pack years: 4.00    Types: Cigarettes, Cigars   Smokeless tobacco: Never  Vaping Use   Vaping Use: Every day  Substance and Sexual Activity   Alcohol use: Yes    Comment: He states occassionally, reports last beer was when his brother died.    Drug use: Yes    Types: Marijuana    Comment: Daily. Last used: yesterday    Sexual activity: Yes    Comment: uta, patient not fully oriented  Other Topics Concern   Not on file  Social History Narrative   Not on file   Social Determinants of Health   Financial Resource Strain: Not on file  Food Insecurity: Not on file  Transportation Needs: Not  on file  Physical Activity: Not on file  Stress: Not on file  Social Connections: Not on file    Allergies:  No Known Allergies  Labs:  Results for orders placed or performed during the hospital encounter of 12/07/22 (from the past 48 hour(s))  CBC     Status: None (Preliminary result)   Collection Time: 12/07/22  2:00 PM  Result Value Ref Range   WBC PENDING  4.0 - 10.5 K/uL   RBC 5.09 4.22 - 5.81 MIL/uL   Hemoglobin 14.6 13.0 - 17.0 g/dL   HCT 45.4 39.0 - 52.0 %   MCV 89.2 80.0 - 100.0 fL   MCH 28.7 26.0 - 34.0 pg   MCHC 32.2 30.0 - 36.0 g/dL   RDW 12.3 11.5 - 15.5 %   Platelets 206 150 - 400 K/uL    Comment: Performed at [facility], [place] [address] [place], [place] [address]   nRBC PENDING 0.0 - 0.2 %    No current facility-administered medications for this encounter.   Current Outpatient Medications  Medication Sig Dispense Refill   amLODipine (NORVASC) 5 MG tablet Take 1 tablet (5 mg total) by mouth daily. 30 tablet 0   OLANZapine zydis (ZYPREXA) 5 MG disintegrating tablet Take 1 tablet (5 mg total) by mouth at bedtime. 30 tablet 0   OVER THE COUNTER MEDICATION Take 1 tablet by mouth See admin instructions. Hydroxycut on days when working out     sertraline (ZOLOFT) 25 MG tablet Take 1 tablet (25 mg total) by mouth daily. 30 tablet 0    Musculoskeletal: Strength & Muscle Tone: within normal limits Gait & Station: normal Patient leans: N/A   Psychiatric Specialty Exam: Presentation  General Appearance:  Appropriate for Environment  Eye Contact: Minimal  Speech: Slow  Speech Volume: Decreased  Handedness: Right   Mood and Affect  Mood: Depressed  Affect: Constricted   Thought Process  Thought Processes: Disorganized  Descriptions of Associations:Loose  Orientation:Partial  Thought Content:WDL  History of Schizophrenia/Schizoaffective disorder:No  Duration of Psychotic Symptoms:N/A  Hallucinations:Hallucinations:  None  Ideas of Reference:None  Suicidal Thoughts:Suicidal Thoughts: No  Homicidal Thoughts:Homicidal Thoughts: No   Sensorium  Memory: Immediate Fair; Remote Fair  Judgment: Fair  Insight: Fair   Community education officer  Concentration: Fair  Attention Span: Fair  Recall: Good  Fund of Knowledge: Good  Language: Fair   Psychomotor Activity  Psychomotor Activity: Psychomotor Activity: Psychomotor Retardation   Assets  Assets: Social Support; Armed forces logistics/support/administrative officer; Housing; Transportation    Sleep  Sleep: Sleep: Poor   Physical Exam: Physical Exam Pulmonary:     Effort: Pulmonary effort is normal.  Skin:    General: Skin is warm.  Neurological:     Mental Status: He is alert.  Psychiatric:        Attention and Perception: He is inattentive.        Mood and Affect: Mood is depressed. Affect is flat.        Speech: Speech is delayed.        Behavior: Behavior is slowed. Behavior is cooperative.        Judgment: Judgment normal.    Review of Systems  Gastrointestinal: Negative.   Musculoskeletal: Negative.   Skin: Negative.   Psychiatric/Behavioral:  Positive for depression. The patient has insomnia.    Blood pressure (!) 143/94, pulse 98, temperature 98.3 F (36.8 C), resp. rate 20, SpO2 98 %. There is no height or weight on file to calculate BMI.  Medical Decision Making: Inpatient psychiatric admission recommended. SW and [facility] will review chart, for bed placement. Patient is IVC. Zyprexa 5 mg PO given NOW and @HS  for bizarre behavior/psychosis by EDP   Disposition: Recommend psychiatric Inpatient admission when medically cleared.  Michaele Offer, PMHNP 12/07/2022 3:04 PM

## 2022-12-07 NOTE — Progress Notes (Signed)
Pt was accepted to [facility] 12/07/20[facility]. Bed assignment: Main campus  Pt meets inpatient criteria per Michaele Offer, [place]  Attending Physician will be Christella Noa, MD  Report can be called to: [phone] (this is a pager, please leave call-back number when giving report)  Bed is ready now  Care Team Notified: Michaele Offer, [place], [place], RN, Wilmon Arms, RN, and [address] LCSWA  [place], [place]  12/07/2022 4:18 PM

## 2022-12-07 NOTE — ED Provider Notes (Addendum)
  Physical Exam  BP (!) 143/94 (BP Location: Right Arm)   Pulse 98   Temp 98.3 F (36.8 C)   Resp 20   SpO2 98%   Physical Exam  Procedures  Procedures  ED Course / MDM    Medical Decision Making Amount and/or Complexity of Data Reviewed Labs: ordered.  Risk Prescription drug management.   Patient is medically cleared.  Urine does show potential infection but without symptoms we will just send culture and if results will treat.       Davonna Belling, MD 12/07/22 1709  Has been accepted at [facility] by Dr. Nigel Mormon.  Reevaluated and will transfer.     Davonna Belling, MD 12/07/22 1750

## 2022-12-07 NOTE — Progress Notes (Signed)
LCSW Progress Note  [id number]   Jimmie Rueter  12/07/2022  3:48 PM  Description:   Inpatient Psychiatric Referral  Patient was recommended inpatient per [facility], [place]. There are no available beds at [facility]. Patient was referred to the following facilities:   Destination  Service Provider Address Phone Fax  [facility]  [address], [place] [address] [phone]  [place]  [address] [place] [address] [phone]  [facility]  [place], [place] [address] [phone]  [facility]  [place], [place] [address] [place]  [facility]  [address] [place] [address] [phone]  [place]  [place], [place] [address] [phone]  [facility]  [address] [place], [place] [address] [phone] [facility]  [place] [place] [address] [place]  [facility]  [address], [place] [address] [phone]  [facility]  [address], [place] [address] [phone]  [facility]  [address], [place] [address] [place]  [address] [place] [address] [phone]  [facility]  [address] [place] [address] [phone] [place]  [address], [place] [address] [phone]  [facility]  [address] [place] [address] [phone] [place]  [address] [place] [address] [place]  [facility]  [address], [place] [address] [phone]  [facility]  [address] [place] [address] [phone] [place] [facility]  [address] [place] [address] [phone]  [place] [place] [address] [phone]  [facility]  [address], [place] [address] [phone]      Situation ongoing, CSW to continue following and update chart as more information becomes available.      Herbie [place]  12/07/2022 3:48 PM

## 2022-12-07 NOTE — ED Notes (Signed)
Patient discharged off unit to [facility] per provider. Patient alert, calm, cooperative, no s/s of distress. Patient discharge information and belongings given to [facility] for transport. Patient ambulatory off unit, escorted and transported by [facility].

## 2022-12-07 NOTE — ED Provider Notes (Cosign Needed Addendum)
[facility] DEPT Provider Note   CSN: [id number] Arrival date & time: 12/07/22  1244     History  Chief Complaint  Patient presents with   Depression    Gabriel Nelson is a 22 y.o. male with medical history of ADHD, depression, hypertension, bizarre behavior.  Patient presents to ED for evaluation of bizarre behavior.  Per triage note, patient is complaining of depression and states that he does not know how long he has been feeling this way.  Patient denies SI, HI.  The patient reports that his home and work life are fine he just does not feel like himself.  The patient denies drug use, denies alcohol use.  On my examination of this patient, the patient is shown to be tapping his right foot repeatedly.  The patient will look over to me when I addressed him however he would not respond to my questions.  The patient at 1 point quickly reacted to his phone falling out of his pocket and placed the phone back in his pocket.  The patient continued to be nonverbal with me however would look at me when I addressed him.  I had my attending, Dr. Matilde Sprang, evaluate the patient.  The patient was also not responding to my attending.  Patient visitor reportedly showed up while evaluation was taking place with Dr. Matilde Sprang.  Patient visitor stated that this is how the patient gets when he becomes "acutely psychotic".   Depression       Home Medications Prior to Admission medications   Medication Sig Start Date End Date Taking? Authorizing Provider  amLODipine (NORVASC) 5 MG tablet Take 1 tablet (5 mg total) by mouth daily. 02/18/22 04/07/22  Gareth Morgan, MD  OLANZapine zydis (ZYPREXA) 5 MG disintegrating tablet Take 1 tablet (5 mg total) by mouth at bedtime. 02/18/22 04/07/22  Delfin Gant, NP  OVER THE COUNTER MEDICATION Take 1 tablet by mouth See admin instructions. Hydroxycut on days when working out    [provider]  sertraline (ZOLOFT) 25 MG tablet  Take 1 tablet (25 mg total) by mouth daily. 02/18/22 04/07/22  Delfin Gant, NP      Allergies    Patient has no known allergies.    Review of Systems   Review of Systems  Unable to perform ROS: Psychiatric disorder (Level 5 caveat)  Psychiatric/Behavioral:  Positive for depression.     Physical Exam Updated Vital Signs BP (!) 143/94 (BP Location: Right Arm)   Pulse 98   Temp 98.3 F (36.8 C)   Resp 20   SpO2 98%  Physical Exam Vitals and nursing note reviewed.  Constitutional:      General: He is not in acute distress.    Appearance: He is well-developed.  HENT:     Head: Normocephalic and atraumatic.  Eyes:     Conjunctiva/sclera: Conjunctivae normal.  Cardiovascular:     Rate and Rhythm: Normal rate and regular rhythm.     Heart sounds: No murmur heard. Pulmonary:     Effort: Pulmonary effort is normal. No respiratory distress.     Breath sounds: Normal breath sounds.  Abdominal:     Palpations: Abdomen is soft.     Tenderness: There is no abdominal tenderness.  Musculoskeletal:        General: No swelling.     Cervical back: Neck supple.  Skin:    General: Skin is warm and dry.     Capillary Refill: Capillary refill takes less than  2 seconds.  Neurological:     Mental Status: He is alert.  Psychiatric:        Speech: He is noncommunicative.        Behavior: Behavior is withdrawn.     Comments: Patient repeatedly tapping right foot.  Patient will look over at provider when addressed however would not respond to questions.      ED Results / Procedures / Treatments   Labs (all labs ordered are listed, but only abnormal results are displayed) Labs Reviewed  CBC  BASIC METABOLIC PANEL  URINALYSIS, ROUTINE W REFLEX MICROSCOPIC  RAPID URINE DRUG SCREEN, HOSP PERFORMED  ACETAMINOPHEN LEVEL  SALICYLATE LEVEL  ETHANOL    EKG None  Radiology No results found.  Procedures Procedures    Medications Ordered in ED Medications  OLANZapine zydis  (ZYPREXA) disintegrating tablet 5 mg (has no administration in time range)    ED Course/ Medical Decision Making/ A&P   {                            Medical Decision Making Amount and/or Complexity of Data Reviewed Labs: ordered.  Risk Prescription drug management.   22 year old male presents to the ED for evaluation.  Please see HPI for further details.  On my examination the patient is afebrile and nontachycardic.  The patient lung sounds are clear bilaterally, he is not hypoxic.  Patient sitting in an upright posture repeatedly tapping right foot.  The patient looks to me as I enter the room, he looks to me as I address him however the patient would not reply to any of my questions.  At 1 point during examination the patient's phone began to fall out of his pocket and the patient quickly reacted and grabbed his phone and placed it back in his pocket.  The patient continued to be nonverbal, noncommunicative during my examination and interview.  I had my attending, Dr. Matilde Sprang, I evaluate the patient as well.  The patient was noncommunicative with my attending as well.  During examination with my attending, the patient had a visitor show up to stated that this is how the patient gets when he is "psychotic".  At this time, I have placed lab orders to include CBC, BMP, salicylate level, acetaminophen level, rapid urine drug screen, urinalysis, EKG. me and my attending have involuntarily committed this patient due to his acute state as well as his need for evaluation and concern for flight risk.  The patient will be placed in a psych hold and evaluated by TTS.  Patient also has been given his home Zyprexa dose.  At this time, patient lab work has not resulted. Patient will be signed out to oncoming team pending TTS evaluation and lab work.   Final Clinical Impression(s) / ED Diagnoses Final diagnoses:  Bizarre behavior    Rx / DC Orders ED Discharge Orders     None              Lawana Chambers 12/07/22 [place], MD 12/08/22 1902

## 2022-12-07 NOTE — ED Triage Notes (Signed)
Pt c/o depression, states he does not know how long he has been feeling like this. Pt denies SI, denies HI. Pt states home and work are fine, he just doesn't feel like himself. Pt denies drugs, denies alcohol. Pt not making eye contact, slow to answer questions.

## 2022-12-07 NOTE — ED Notes (Signed)
Patient has 2 patient belonging bags placed in locker 30 in [place]. Patient dressed out into burgandy scrubs and wanded by security.

## 2022-12-08 LAB — URINE CULTURE: Culture: NO GROWTH

## 2022-12-13 ENCOUNTER — Encounter (HOSPITAL_COMMUNITY): Payer: Self-pay

## 2022-12-13 ENCOUNTER — Other Ambulatory Visit: Payer: Self-pay

## 2022-12-13 ENCOUNTER — Emergency Department (HOSPITAL_COMMUNITY)
Admission: EM | Admit: 2022-12-13 | Discharge: 2022-12-14 | Disposition: A | Discharge status: Home / Self Care | Payer: Medicaid Other | Attending: Emergency Medicine | Admitting: Emergency Medicine

## 2022-12-13 DIAGNOSIS — I10 Essential (primary) hypertension: Secondary | ICD-10-CM | POA: Diagnosis not present

## 2022-12-13 DIAGNOSIS — Z87891 Personal history of nicotine dependence: Secondary | ICD-10-CM | POA: Insufficient documentation

## 2022-12-13 DIAGNOSIS — R4689 Other symptoms and signs involving appearance and behavior: Secondary | ICD-10-CM | POA: Diagnosis not present

## 2022-12-13 DIAGNOSIS — E876 Hypokalemia: Secondary | ICD-10-CM

## 2022-12-13 DIAGNOSIS — F29 Unspecified psychosis not due to a substance or known physiological condition: Secondary | ICD-10-CM | POA: Diagnosis not present

## 2022-12-13 DIAGNOSIS — F4323 Adjustment disorder with mixed anxiety and depressed mood: Secondary | ICD-10-CM | POA: Diagnosis present

## 2022-12-13 DIAGNOSIS — Z1152 Encounter for screening for COVID-19: Secondary | ICD-10-CM | POA: Diagnosis not present

## 2022-12-13 DIAGNOSIS — R451 Restlessness and agitation: Secondary | ICD-10-CM | POA: Diagnosis present

## 2022-12-13 LAB — CBC WITH DIFFERENTIAL/PLATELET
Abs Immature Granulocytes: 0.03 10*3/uL (ref 0.00–0.07)
Basophils Absolute: 0.1 10*3/uL (ref 0.0–0.1)
Basophils Relative: 1 %
Eosinophils Absolute: 0 10*3/uL (ref 0.0–0.5)
Eosinophils Relative: 0 %
HCT: 44.6 % (ref 39.0–52.0)
Hemoglobin: 14.7 g/dL (ref 13.0–17.0)
Immature Granulocytes: 0 %
Lymphocytes Relative: 12 %
Lymphs Abs: 1.1 10*3/uL (ref 0.7–4.0)
MCH: 28.7 pg (ref 26.0–34.0)
MCHC: 33 g/dL (ref 30.0–36.0)
MCV: 87.1 fL (ref 80.0–100.0)
Monocytes Absolute: 0.9 10*3/uL (ref 0.1–1.0)
Monocytes Relative: 9 %
Neutro Abs: 7.2 10*3/uL (ref 1.7–7.7)
Neutrophils Relative %: 78 %
Platelets: 218 10*3/uL (ref 150–400)
RBC: 5.12 MIL/uL (ref 4.22–5.81)
RDW: 12.1 % (ref 11.5–15.5)
WBC: 9.2 10*3/uL (ref 4.0–10.5)
nRBC: 0 % (ref 0.0–0.2)

## 2022-12-13 LAB — COMPREHENSIVE METABOLIC PANEL
ALT: 27 U/L (ref 0–44)
AST: 26 U/L (ref 15–41)
Albumin: 4.6 g/dL (ref 3.5–5.0)
Alkaline Phosphatase: 49 U/L (ref 38–126)
Anion gap: 14 (ref 5–15)
BUN: 21 mg/dL — ABNORMAL HIGH (ref 6–20)
CO2: 23 mmol/L (ref 22–32)
Calcium: 9.3 mg/dL (ref 8.9–10.3)
Chloride: 97 mmol/L — ABNORMAL LOW (ref 98–111)
Creatinine, Ser: 1.67 mg/dL — ABNORMAL HIGH (ref 0.61–1.24)
GFR, Estimated: 59 mL/min — ABNORMAL LOW (ref >60)
Glucose, Bld: 101 mg/dL — ABNORMAL HIGH (ref 70–99)
Potassium: 3 mmol/L — ABNORMAL LOW (ref 3.5–5.1)
Sodium: 134 mmol/L — ABNORMAL LOW (ref 135–145)
Total Bilirubin: 1.2 mg/dL (ref 0.3–1.2)
Total Protein: 8.1 g/dL (ref 6.5–8.1)

## 2022-12-13 LAB — SALICYLATE LEVEL: Salicylate Lvl: <7 mg/dL — ABNORMAL LOW (ref 7.0–30.0)

## 2022-12-13 LAB — RESP PANEL BY RT-PCR (RSV, FLU A&B, COVID)  RVPGX2
Influenza A by PCR: NEGATIVE
Influenza B by PCR: NEGATIVE
Resp Syncytial Virus by PCR: NEGATIVE
SARS Coronavirus 2 by RT PCR: NEGATIVE

## 2022-12-13 LAB — CK: Total CK: 384 U/L (ref 49–397)

## 2022-12-13 LAB — ACETAMINOPHEN LEVEL: Acetaminophen (Tylenol), Serum: <10 ug/mL — ABNORMAL LOW (ref 10–30)

## 2022-12-13 LAB — ETHANOL: Alcohol, Ethyl (B): <10 mg/dL (ref <10)

## 2022-12-13 MED ORDER — SERTRALINE HCL 50 MG PO TABS
25.0000 mg | ORAL_TABLET | Freq: Every day | ORAL | Status: DC
  Administered 2022-12-14: 25 mg via ORAL
  Filled 2022-12-13: qty 1

## 2022-12-13 MED ORDER — ACETAMINOPHEN 325 MG PO TABS: 650.0000 mg | ORAL_TABLET | ORAL | Status: DC | PRN

## 2022-12-13 MED ORDER — AMLODIPINE BESYLATE 5 MG PO TABS
5.0000 mg | ORAL_TABLET | Freq: Every day | ORAL | Status: DC
  Administered 2022-12-14: 5 mg via ORAL
  Filled 2022-12-13: qty 1

## 2022-12-13 MED ORDER — NICOTINE 21 MG/24HR TD PT24
21.0000 mg | MEDICATED_PATCH | Freq: Every day | TRANSDERMAL | Status: DC
  Administered 2022-12-14: 21 mg via TRANSDERMAL
  Filled 2022-12-13: qty 1

## 2022-12-13 MED ORDER — OLANZAPINE 5 MG PO TBDP: 5.0000 mg | ORAL_TABLET | Freq: Every day | ORAL | Status: DC

## 2022-12-13 MED ORDER — ONDANSETRON HCL 4 MG PO TABS: 4.0000 mg | ORAL_TABLET | Freq: Three times a day (TID) | ORAL | Status: DC | PRN

## 2022-12-13 MED ORDER — SODIUM CHLORIDE 0.9 % IV BOLUS
1000.0000 mL | Freq: Once | INTRAVENOUS | Status: AC
  Administered 2022-12-13: 1000 mL via INTRAVENOUS

## 2022-12-13 MED ORDER — ALUM & MAG HYDROXIDE-SIMETH 200-200-20 MG/5ML PO SUSP: 30.0000 mL | Freq: Four times a day (QID) | ORAL | Status: DC | PRN

## 2022-12-13 MED ORDER — POTASSIUM CHLORIDE CRYS ER 20 MEQ PO TBCR
40.0000 meq | EXTENDED_RELEASE_TABLET | Freq: Once | ORAL | Status: DC
  Filled 2022-12-13: qty 2

## 2022-12-13 NOTE — ED Provider Notes (Signed)
Emergency Department Provider Note   I have reviewed the triage vital signs and the nursing notes.   HISTORY  Chief Complaint Psychiatric Evaluation   HPI Gabriel Nelson is a 22 y.o. male with past history of depression and recent ED evaluation for psychosis leading to psychiatry admission at [facility] presents to the emergency department with increased agitation at work.  Per EMS, he was shouting and pacing.  He reportedly has not been taking his risperidone.  Unclear when he was discharged from [facility] at this time.  Patient required intramuscular Versed and Haldol prior to arrival.  He is somnolent and unable to provide significant history.   Past Medical History:  Diagnosis Date   ADHD    Depression    Hypertension    Medical history non-contributory     Review of Systems  Level 5 caveat: Sedation after IM medications  ____________________________________________   PHYSICAL EXAM:  VITAL SIGNS: ED Triage Vitals  Enc Vitals Group     BP 12/13/22 2004 129/75     Pulse Rate 12/13/22 2004 (!) 104     Resp 12/13/22 2004 16     Temp 12/13/22 2004 98.4 F (36.9 C)     Temp Source 12/13/22 2004 Oral     SpO2 12/13/22 2004 94 %     Weight 12/13/22 2004 225 lb (102.1 kg)     Height 12/13/22 2004 5\' 10"  (1.778 m)   Constitutional: Arouses briefly to pain. Purposeful movement.  Eyes: Conjunctivae are normal. PERRL (4 mm).  Head: Atraumatic. Nose: No congestion/rhinnorhea. Mouth/Throat: Mucous membranes are moist.   Neck: No stridor.   Cardiovascular: Tachycardia. Good peripheral circulation. Grossly normal heart sounds.   Respiratory: Normal respiratory effort.  No retractions. Lungs CTAB. Gastrointestinal: Soft and nontender. No distention.  Musculoskeletal: No lower extremity tenderness nor edema. No gross deformities of extremities. Neurologic: Arouses to pain with purposeful movement. Moving all extremities.  Skin:  Skin is warm, dry and intact. No rash  noted.  ____________________________________________   LABS (all labs ordered are listed, but only abnormal results are displayed)  Labs Reviewed  ACETAMINOPHEN LEVEL - Abnormal; Notable for the following components:      Result Value   Acetaminophen (Tylenol), Serum <10 (*)    All other components within normal limits  COMPREHENSIVE METABOLIC PANEL - Abnormal; Notable for the following components:   Sodium 134 (*)    Potassium 3.0 (*)    Chloride 97 (*)    Glucose, Bld 101 (*)    BUN 21 (*)    Creatinine, Ser 1.67 (*)    GFR, Estimated 59 (*)    All other components within normal limits  SALICYLATE LEVEL - Abnormal; Notable for the following components:   Salicylate Lvl <7.0 (*)    All other components within normal limits  RESP PANEL BY RT-PCR (RSV, FLU A&B, COVID)  RVPGX2  ETHANOL  CBC WITH DIFFERENTIAL/PLATELET  CK  URINALYSIS, ROUTINE W REFLEX MICROSCOPIC  RAPID URINE DRUG SCREEN, HOSP PERFORMED   ____________________________________________  EKG   EKG Interpretation  Date/Time:  Thursday December 13 2022 21:15:09 EST Ventricular Rate:  95 PR Interval:  151 QRS Duration: 87 QT Interval:  347 QTC Calculation: 437 R Axis:   89 Text Interpretation: Sinus rhythm Consider right atrial enlargement ST changes similar to prior Confirmed by 01-29-1994 [phone]) on 12/13/2022 9:53:55 PM        ____________________________________________   PROCEDURES  Procedure(s) performed:   .Critical Care  Performed by:  Aleyda Gindlesperger, Wonda Olds, MD Authorized by: Margette Fast, MD   Critical care provider statement:    Critical care time (minutes):  35   Critical care time was exclusive of:  Separately billable procedures and treating other patients and teaching time   Critical care was necessary to treat or prevent imminent or life-threatening deterioration of the following conditions:  CNS failure or compromise (sedation for psychosis)   Critical care was time spent personally  by me on the following activities:  Development of treatment plan with patient or surrogate, discussions with consultants, evaluation of patient's response to treatment, examination of patient, ordering and review of laboratory studies, ordering and review of radiographic studies, ordering and performing treatments and interventions, pulse oximetry, re-evaluation of patient's condition, review of old charts and obtaining history from patient or surrogate   I assumed direction of critical care for this patient from another provider in my specialty: no     Care discussed with: admitting provider      ____________________________________________   INITIAL IMPRESSION / [place] / ED COURSE  Pertinent labs & imaging results that were available during my care of the patient were reviewed by me and considered in my medical decision making (see chart for details).   This patient is Presenting for Evaluation of AMS, which does require a range of treatment options, and is a complaint that involves a high risk of morbidity and mortality.  The Differential Diagnoses includes but is not exclusive to alcohol, illicit or prescription medications, intracranial pathology such as stroke, intracerebral hemorrhage, fever or infectious causes including sepsis, hypoxemia, uremia, trauma, endocrine related disorders such as diabetes, hypoglycemia, thyroid-related diseases, etc.   Critical Interventions-    Medications  potassium chloride SA (KLOR-CON M) CR tablet 40 mEq (40 mEq Oral Not Given 12/13/22 2230)  sertraline (ZOLOFT) tablet 25 mg (has no administration in time range)  OLANZapine zydis (ZYPREXA) disintegrating tablet 5 mg (has no administration in time range)  amLODipine (NORVASC) tablet 5 mg (has no administration in time range)  sodium chloride 0.9 % bolus 1,000 mL (1,000 mLs Intravenous New Bag/Given 12/13/22 2229)    Reassessment after intervention: mental status improving.    I did  obtain Additional Historical Information from EMS.   I decided to review pertinent External Data, and in summary ED evaluation for psychosis on 12/07/22 leading to admission at [facility].    Clinical Laboratory Tests Ordered, included CMP with creatinine of 1.67.  Labs in the past have been in the 1.4-1.5 range.  Plan for IV fluids.  Slightly low potassium.  Plan to replace orally.  Salicylate and Tylenol negative.  Alcohol negative.  No abnormality on CBC.   Cardiac Monitor Tracing which shows NSR.    Social Determinants of Health Risk patient with a smoking history.   Consult complete with TTS.   Medical Decision Making: Summary:  Patient presents to the emergency department for evaluation of apparent psychosis type agitation, pacing, shouting.  He required intramuscular Versed and Haldol with EMS and so on my assessment is somnolent although arouses to pain briefly.  Not providing significant history.  Patient appears to be maintaining his airway at this time and not requiring additional support.  Plan for screening labs and reassess.   Reevaluation with update and discussion with patient is more alert but remains drowsy.  No indication for advanced neuroimaging.  Patient will continue to have the medications given by EMS metabolize.  Will place order for potassium supplementation over  the next 3 days. IVF bolus given here. Continue to encourage PO hydration. CK pending.   CK normal. Patient is medically clear. Did not arrive under IVC.   Patient's presentation is most consistent with acute presentation with potential threat to life or bodily function.   Disposition: pending TTS  ____________________________________________  FINAL CLINICAL IMPRESSION(S) / ED DIAGNOSES  Final diagnoses:  Aggressive behavior  Hypokalemia    Note:  This document was prepared using Dragon voice recognition software and may include unintentional dictation errors.  Nanda Quinton, MD, [facility] Emergency  Medicine    Kenae Lindquist, Wonda Olds, MD 12/13/22 2325

## 2022-12-13 NOTE — ED Triage Notes (Addendum)
Pt BIB EMS, pt had a psychotic episode at work today and wants a Management consultant. Pt was just screaming and walking around. Mom states that pt has not been taking his Risperidone. Pt states that he "just had a bad day".  5 mg versed IM  5 mg haldol IM

## 2022-12-14 DIAGNOSIS — F29 Unspecified psychosis not due to a substance or known physiological condition: Secondary | ICD-10-CM

## 2022-12-14 MED ORDER — OLANZAPINE 5 MG PO TBDP: 5.0000 mg | ORAL_TABLET | Freq: Every day | ORAL | 0 refills | Status: DC

## 2022-12-14 MED ORDER — SERTRALINE HCL 25 MG PO TABS: 25.0000 mg | ORAL_TABLET | Freq: Every day | ORAL | 0 refills | Status: DC

## 2022-12-14 NOTE — ED Notes (Signed)
TTS in progress 

## 2022-12-14 NOTE — BH Assessment (Signed)
At this time RN Irine Seal and this clinician are having technical problems with the teleassessment machine.

## 2022-12-14 NOTE — Discharge Summary (Signed)
[facility] Psych ED Discharge  12/14/2022 11:54 AM Gabriel Nelson  MRN:  [id number]  Principal Problem: Psychosis [facility]) Discharge Diagnoses: Principal Problem:   Psychosis (HCC) Active Problems:   Adjustment disorder with mixed anxiety and depressed mood  Clinical Impression:  Final diagnoses:  Aggressive behavior  Hypokalemia   Subjective: Gabriel Nelson, 22 y.o., male patient seen face to face by this provider, consulted with Dr. Lucianne Muss; and chart reviewed on 12/14/22.  On evaluation Gabriel Nelson reports he was at work and he began to feel overwhelmed patient states he works at [employer], and things just got hectic he states, he also said his son was born last weekend, patient states this is his first and only child and he was also feeling anxious being a dad.  Patient said he just needed some rest, patient says he is with his girlfriend the mother of his baby and they are doing well together, she is having some mild postpartum depression and he has been caring for their son so that she can rest and do some self-care for herself, and he did not know that he also needed some self-care he overlooked, he says.  Patient states his appetite is good, his sleep is poor due to anxiety and not taking his psychiatric medications, because he ran out of them.  Patient also denies SI/HI/AVH.  Patient denies using any illicit drug or alcohol except for THC, his UDS was positive for THC.  Patient was admitted on December 07, 2022 and was sent to an inpatient facility due to bizarre behavior/psychosis, this admission during evaluation patient is sitting on the hospital bed in no acute distress.  His affect and mood are much more brighter at this admission, patient even had a smile, and more insight into his actions and is looking towards the future for his son.  Patient appears to be happy about being a new father, he says he wants to be there for his son. He is alert, oriented x 4, calm, cooperative and attentive.  He has normal speech, and behavior, his last admission he was having a hard time speaking and allowing his sister to speak for him.  Objectively there is no evidence of psychosis/mania or delusional thinking.  Patient is able to converse coherently, goal directed thoughts, no distractibility, or pre-occupation. He denies suicidal/self-harm/homicidal ideation, psychosis, and paranoia.  Patient is very pleasant upon assessment, knows that he needs to take his medications and get them refilled on time.  Provider informed him about the [facility], and offered to give him resources, to help him with therapy and medication management, patient said he would definitely look into it.  At this time Gabriel Nelson is educated and verbalizes understanding of mental health resources and other crisis services in the community. He is instructed to call 911 and present to the nearest emergency room should he experience any suicidal/homicidal ideation, auditory/visual/hallucinations, or detrimental worsening of his mental health condition. He was a also advised by Clinical research associate that he could call the toll-free phone on back of  insurance card to assist with identifying in network counselors and agencies or number on back of Medicaid card to speak with care coordinator   ED Assessment Time Calculation: Start Time: 1015 Stop Time: 1045 Total Time in Minutes (Assessment Completion): 30   Past Psychiatric History: Patient has a history of altered mental status, and psychosis, and bizarre behavior, he has also been admitted to an inpatient facility.     Past Medical History:  Past Medical  History:  Diagnosis Date   ADHD    Depression    Hypertension    Medical history non-contributory     Past Surgical History:  Procedure Laterality Date   NO PAST SURGERIES     Family History:  Family History  Problem Relation Age of Onset   Healthy Mother     Social History:  Social History   Substance and Sexual Activity  Alcohol Use  Yes   Comment: He states occassionally, reports last beer was when his brother died.      Social History   Substance and Sexual Activity  Drug Use Yes   Types: Marijuana   Comment: Daily. Last used: yesterday     Social History   Socioeconomic History   Marital status: Single    Spouse name: Not on file   Number of children: Not on file   Years of education: Not on file   Highest education level: Not on file  Occupational History   Not on file  Tobacco Use   Smoking status: Every Day    Packs/day: 1.00    Years: 4.00    Total pack years: 4.00    Types: Cigarettes, Cigars   Smokeless tobacco: Never  Vaping Use   Vaping Use: Every day  Substance and Sexual Activity   Alcohol use: Yes    Comment: He states occassionally, reports last beer was when his brother died.    Drug use: Yes    Types: Marijuana    Comment: Daily. Last used: yesterday    Sexual activity: Yes    Comment: uta, patient not fully oriented  Other Topics Concern   Not on file  Social History Narrative   Not on file   Social Determinants of Health   Financial Resource Strain: Not on file  Food Insecurity: Not on file  Transportation Needs: Not on file  Physical Activity: Not on file  Stress: Not on file  Social Connections: Not on file    Tobacco Cessation:  N/A, patient does not currently use tobacco products  Current Medications: Current Facility-Administered Medications  Medication Dose Route Frequency Provider Last Rate Last Admin   acetaminophen (TYLENOL) tablet 650 mg  650 mg Oral Q4H PRN Long, Wonda Olds, MD       alum & mag hydroxide-simeth (MAALOX/MYLANTA) 200-200-20 MG/5ML suspension 30 mL  30 mL Oral Q6H PRN Long, Wonda Olds, MD       amLODipine (NORVASC) tablet 5 mg  5 mg Oral Daily Long, Wonda Olds, MD   5 mg at 12/14/22 0956   nicotine (NICODERM CQ - dosed in mg/24 hours) patch 21 mg  21 mg Transdermal Daily Long, Wonda Olds, MD   21 mg at 12/14/22 0957   OLANZapine zydis (ZYPREXA)  disintegrating tablet 5 mg  5 mg Oral QHS Long, Wonda Olds, MD       ondansetron (ZOFRAN) tablet 4 mg  4 mg Oral Q8H PRN Long, Wonda Olds, MD       potassium chloride SA (KLOR-CON M) CR tablet 40 mEq  40 mEq Oral Once Margette Fast, MD       sertraline (ZOLOFT) tablet 25 mg  25 mg Oral Daily Long, Wonda Olds, MD   25 mg at 12/14/22 8563   Current Outpatient Medications  Medication Sig Dispense Refill   amLODipine (NORVASC) 5 MG tablet Take 1 tablet (5 mg total) by mouth daily. 30 tablet 0   OLANZapine zydis (ZYPREXA) 5 MG disintegrating tablet Take 1 tablet (  5 mg total) by mouth at bedtime for 7 days. 7 tablet 0   OVER THE COUNTER MEDICATION Take 1 tablet by mouth See admin instructions. Hydroxycut on days when working out     sertraline (ZOLOFT) 25 MG tablet Take 1 tablet (25 mg total) by mouth daily for 7 days. 7 tablet 0   PTA Medications: (Not in a hospital admission)   [place] Scale:  Flowsheet Row ED from [facility] ED from 12/07/2022 in [facility] Emergency Department at [facility] ED from 04/08/2022 in [facility] Department at [facility]  C-SSRS RISK CATEGORY No Risk No Risk Low Risk       Musculoskeletal: Strength & Muscle Tone: within normal limits Gait & Station: normal Patient leans: N/A  Psychiatric Specialty Exam: Presentation  General Appearance:  Appropriate for Environment  Eye Contact: Good  Speech: Clear and Coherent  Speech Volume: Normal  Handedness: Right   Mood and Affect  Mood: Anxious; Euthymic  Affect: Appropriate   Thought Process  Thought Processes: Coherent  Descriptions of Associations:Intact  Orientation:Full (Time, Place and Person)  Thought Content:WDL  History of Schizophrenia/Schizoaffective disorder:No  Duration of Psychotic Symptoms:N/A  Hallucinations:Hallucinations: None  Ideas of Reference:None  Suicidal Thoughts:Suicidal  Thoughts: No  Homicidal Thoughts:Homicidal Thoughts: No   Sensorium  Memory: Immediate Good; Remote Good  Judgment: Good  Insight: Good   Executive Functions  Concentration: Good  Attention Span: Good  Recall: Good  Fund of Knowledge: Good  Language: Good   Psychomotor Activity  Psychomotor Activity: Psychomotor Activity: Normal   Assets  Assets: Communication Skills; Desire for Improvement; Financial Resources/Insurance; Housing; Social Support   Sleep  Sleep: Sleep: Poor    Physical Exam: Physical Exam Pulmonary:     Effort: Pulmonary effort is normal.  Musculoskeletal:     Cervical back: Normal range of motion.  Neurological:     Mental Status: He is alert.  Psychiatric:        Mood and Affect: Mood normal.        Behavior: Behavior normal.        Thought Content: Thought content normal.        Judgment: Judgment normal.    Review of Systems  Constitutional: Negative.   HENT: Negative.    Cardiovascular: Negative.   Musculoskeletal: Negative.   Psychiatric/Behavioral: Negative.     Blood pressure 125/81, pulse 93, temperature 97.9 F (36.6 C), temperature source Oral, resp. rate 18, height 5\' 10"  (1.778 m), weight 102.1 kg, SpO2 97 %. Body mass index is 32.28 kg/m.   Demographic Factors:  Male and Adolescent or young adult  Historical Factors: Impulsivity  Risk Reduction Factors:   Responsible for children under 66 years of age, Sense of responsibility to family, and Positive social support  Continued Clinical Symptoms:  Severe Anxiety and/or Agitation Panic Attacks  Suicide Risk:  Minimal: No identifiable suicidal ideation.  Patients presenting with no risk factors but with morbid ruminations; may be classified as minimal risk based on the severity of the depressive symptoms   Follow-up Information     [facility]. Schedule an appointment as soon as possible for a visit in 5 day(s).    Specialty: Urgent Care Why: Please follow up here for therapy and medication management. Contact information: [address] [place] [phone]                Medical Decision Making:  At time of discharge, patient denies SI, HI, AVH and is able to contract for safety. He demonstrated no overt evidence of psychosis or mania. Prior to discharge, patient verbalized that they understood warning signs, triggers, and symptoms of worsening mental health and how to access emergency mental health care if they felt it was needed. Patient was instructed to call 911 or return to the emergency room if they experienced any concerning symptoms after discharge. Patient voiced understanding and agreed to the above. Resources given to [facility], patient given prescription for 7 days of Zoloft 25 mg daily and Zyprexa Zydis 5 mg @ HS.    Disposition: Patient does not meet criteria for psychiatric inpatient admission. Supportive therapy provided about ongoing stressors. Discussed crisis plan, support from social network, calling 911, coming to the Emergency Department, and calling Suicide Hotline.   Joee Iovine MOTLEY-MANGRUM, PMHNP 12/14/2022, 11:54 AM

## 2022-12-14 NOTE — BH Assessment (Addendum)
Clinician talked with Erasmo Score NP again and she changed recommendation to patient to be seen by provider during rounds this morning.

## 2022-12-14 NOTE — BH Assessment (Addendum)
Comprehensive Clinical Assessment (CCA) Note  12/14/2022 [facility] [id number] Disposition: Clinician discussed patient care with Gabriel Ghee, NP.  She recommends that patient follow up with [facility] as per his discharge instructions from [facility] last week.  Pt is psych cleared, does not meet inpatient care criteria.  Clinician informed RN [facility] of disposition recommendation via secure messaging.    Patient is calm and cooperative during assessment.  His eye contact is normal and he is oriented x4.  Patient is not responding to internal stimuli.  He does not evidence any delusional thought processes.  Patient speaks clearly and coherently.  Judgment is fair.  Patient reports appetite to be WNL.    Patient says he will follow up with [facility].  He admits he needs to have therapy and medication monitoring.     Chief Complaint:  Chief Complaint  Patient presents with   Psychiatric Evaluation   Visit Diagnosis: Anxiety d/o    CCA Screening, Triage and Referral (STR)  Patient Reported Information How did you hear about [place]? Self (Pt was brought to [facility] by EMS.)  What Is the Reason for Your Visit/Call Today? Pt says that things were going well at work.  He cannot pinpoint what happened to make him want the EMS called.  When they arrived he was talking loudly and pacing around.  He said that some stressors are that he recenltly had a son born.  He is also on probation and had to complete a zoom class yesterday morning.  Patient says that he got very little sleep.  He is on two medications but they are outdated and he needs to get the prescription renewed.  Patient denies any SI "I love my life."  Pt denies any hx of self harm.  Pt denies any HI.  Pt denies any A/V hallucinations and has no access to weapons.  Pt is on probation for another 1.5 years.  He reports sleep has been fair and his appetite has been WNL.  Pt is employed at [employer].  He does not have a therapist.  he is  unsure of who is prescribing his medications.  Pt was recently at [facility] about a week ago and was to follow up with [facility] but he has not done this yet.  He says he intends to follow up with them.  Patient feels safe to return to his home.  How Long Has This Been Causing You Problems? <Week  What Do You Feel Would Help You the Most Today? Medication(s); Treatment for Depression or other mood problem   Have You Recently Had Any Thoughts About Hurting Yourself? No  Are You Planning to Commit Suicide/Harm Yourself At This time? No   Flowsheet Row ED from 12/13/2022 in [place] [facility] from 12/07/2022 in [facility] from 04/08/2022 in [facility]  C-SSRS RISK CATEGORY No Risk No Risk Low Risk       Have you Recently Had Thoughts About Hurting Someone [facility]? No  Are You Planning to Harm Someone at This Time? No  Explanation: Pt is denying SI and HI   Have You Used Any Alcohol or Drugs in the Past 24 Hours? No  What Did You Use and How Much? No use   Do You Currently Have a Therapist/Psychiatrist? No  Name of Therapist/Psychiatrist: Name of Therapist/Psychiatrist: None   Have You Been Recently Discharged From Any Office Practice or Programs? Yes  Explanation  of Discharge From Practice/Program: Patient says he was [facility] last [facility].     CCA Screening Triage Referral Assessment Type of Contact: Tele-Assessment  Telemedicine Service Delivery:   Is this Initial or Reassessment? Is this Initial or Reassessment?: Initial Assessment  Date Telepsych consult ordered in [facility]:  Date Telepsych consult ordered in [facility]: 12/13/22  Time Telepsych consult ordered in [facility]:  Time Telepsych consult ordered in [place] [facility]: 2327  Location of Assessment: [facility] ED  Provider Location: [facility] Assessment Services   Collateral Involvement: No one   Does Patient Have a Mono? No  Legal Guardian Contact Information: No guardian  Copy of Legal Guardianship Form: -- (No guardian)  Legal Guardian Notified of Arrival: -- (No guardian)  Legal Guardian Notified of Pending Discharge: -- (No guardian)  If Minor and Not Living with Parent(s), Who has Custody? Pt is a adult  Is CPS involved or ever been involved? Never  Is APS involved or ever been involved? Never   Patient Determined To Be At Risk for Harm To Self or Others Based on Review of Patient Reported Information or Presenting Complaint? No  Method: No Plan (No SI or HI or self harm)  Availability of Means: -- (No SI, HI or self harm reported)  Intent: -- (No SI, HI or self harm reported.)  Notification Required: -- (None)  Additional Information for Danger to Others Potential: -- (No SI, Hi or self harm reported.)  Additional Comments for Danger to Others Potential: None noted  Are There Guns or Other Weapons in [name]? No  Types of Guns/Weapons: Pt says he is on probation and can't be "around that stuff."  Are These Weapons Safely Secured?                            No (No gun)  Who Could Verify You Are Able To Have These Secured: No gun reported  Do You Have any Outstanding Charges, Pending Court Dates, Parole/Probation? Pt is on probation.  Is supposed to see his P.O on 12/19/22.  Contacted To Inform of Risk of Harm To Self or Others: Other: Comment (N/A)    Does Patient Present under Involuntary Commitment? No    [place] of Residence: [facility]   Patient Currently Receiving the Following Services: Not Receiving Services   Determination of Need: Urgent (48 hours)   Options For Referral: Other: Comment (Per Erasmo Score, NP pt should follow up with [facility].  Psych cleared.)     CCA Biopsychosocial Patient Reported Schizophrenia/Schizoaffective Diagnosis in Past: No   Strengths: Pt has good family support. He is able to identify his thoughts,  feelings, and concerns. Pt states he currently lives with his fiance, and they have a son that was born 12/08/22..   Mental Health Symptoms Depression:   Difficulty Concentrating; Irritability; Fatigue; Change in energy/activity   Duration of Depressive symptoms:  Duration of Depressive Symptoms: Greater than two weeks   Mania:   Increased Energy; Irritability   Anxiety:    Restlessness; Tension   Psychosis:   None   Duration of Psychotic symptoms:    Trauma:   None   Obsessions:   None   Compulsions:   None   Inattention:   None   Hyperactivity/Impulsivity:   N/A   Oppositional/Defiant Behaviors:   None   Emotional Irregularity:   Potentially harmful impulsivity; Mood lability; Intense/inappropriate anger   Other Mood/Personality Symptoms:  None noted    Mental Status Exam Appearance and self-care  Stature:   Average   Weight:   Average weight   Clothing:   Casual   Grooming:   Normal   Cosmetic use:   None   Posture/gait:   Normal   Motor activity:   Not Remarkable   Sensorium  Attention:   Normal   Concentration:   Normal   Orientation:   X5   Recall/memory:   Normal   Affect and Mood  Affect:   Appropriate   Mood:   Depressed   Relating  Eye contact:   Normal   Facial expression:   Sad   Attitude toward examiner:   Cooperative   Thought and Language  Speech flow:  Clear and Coherent   Thought content:   Appropriate to Mood and Circumstances   Preoccupation:   None   Hallucinations:   None   Organization:   Coherent   [employer] of Knowledge:   Fair   Intelligence:   Average   Abstraction:   Normal   Judgement:   Fair   Art therapist:   Realistic   Insight:   Fair   Decision Making:   Impulsive   Social Functioning  Social Maturity:   Irresponsible   Social Judgement:   "Games developer"   Stress  Stressors:   Scientist, research (physical sciences); Museum/gallery curator; Transitions   Coping  Ability:   Programme researcher, broadcasting/film/video Deficits:   Self-control; Decision making   Supports:   Friends/Service system; Family     Religion: Religion/Spirituality Are You A Religious Person?: Yes What is Your Religious Affiliation?: Christian How Might This Affect Treatment?: Not in the slightest  Leisure/Recreation: Leisure / Recreation Do You Have Hobbies?: Yes Leisure and Hobbies: Writing, gaming, reading.  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 Patient's Job has Been Impacted by Current Illness: Yes Describe how Patient's Job has Been Impacted: Pt had a panic attack at work yesterday. Has Patient ever Been in the [place]?: No  Education: Education Is Patient Currently Attending School?: No Last Grade Completed: 12 Did You Attend College?: No Did You Have An Individualized Education Program (IIEP): No Did You Have Any Difficulty At School?: No Patient's Education Has Been Impacted by Current Illness: No   CCA Family/Childhood History Family and Relationship History: Family history Marital status: Long term relationship Long term relationship, how long?: Not assessed What types of issues is patient dealing with in the relationship?: Not assessed Additional relationship information: Pt has a son that was just born Saturday 12/08/22. Does patient have children?: Yes How many children?: 2 How is patient's relationship with their children?: One newborn and a 59 year old stepson  Childhood History:  Childhood  History By whom was/is the patient raised?: Mother, Father Did patient suffer any verbal/emotional/physical/sexual abuse as a child?: No Did patient suffer from severe childhood neglect?: No Has patient ever been sexually abused/assaulted/raped as an adolescent or adult?: No Was the patient ever a victim of a crime or a disaster?: No  Witnessed domestic violence?: No Has patient been affected by domestic violence as an adult?: No       CCA Substance Use Alcohol/Drug Use: Alcohol / Drug Use Pain Medications: None Prescriptions: Zyprexa and another med he cannot recall Over the Counter: None History of alcohol / drug use?: Yes (Pt says he has not been using drugs lately.) Longest period of sobriety (when/how long): Four to five months Negative Consequences of Use: Legal                         ASAM's:  Six Dimensions of Multidimensional Assessment  Dimension 1:  Acute Intoxication and/or Withdrawal Potential:      Dimension 2:  Biomedical Conditions and Complications:      Dimension 3:  Emotional, Behavioral, or Cognitive Conditions and Complications:     Dimension 4:  Readiness to Change:     Dimension 5:  Relapse, Continued use, or Continued Problem Potential:     Dimension 6:  Recovery/Living Environment:     ASAM Severity Score:    ASAM Recommended Level of Treatment:     Substance use Disorder (SUD)    Recommendations for Services/Supports/Treatments:    Discharge Disposition:    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

## 2022-12-14 NOTE — ED Notes (Signed)
Pt has been dressed out into [place] scrubs. Pt had a shirt, pants, socks, shoes, in his pants pocket a lighter, headphones, airpods, vape, wallet. Pt has been waded by security. Belongings have been placed in locker 34

## 2022-12-14 NOTE — ED Provider Notes (Addendum)
Emergency Medicine Observation Re-evaluation Note  Breeze Angell is a 22 y.o. male, seen on rounds today.  Pt initially presented to the ED for complaints of Psychiatric Evaluation Currently, the patient is resting.  Physical Exam  BP 112/72   Pulse 69   Temp 98.4 F (36.9 C) (Oral)   Resp 19   Ht 1.778 m (5\' 10" )   Wt 102.1 kg   SpO2 98%   BMI 32.28 kg/m  Physical Exam General: nad Cardiac: regular rate Lungs: breathing easily Psych:   ED Course / MDM  EKG:EKG Interpretation  Date/Time:  Thursday December 13 2022 21:15:09 EST Ventricular Rate:  95 PR Interval:  151 QRS Duration: 87 QT Interval:  347 QTC Calculation: 437 R Axis:   89 Text Interpretation: Sinus rhythm Consider right atrial enlargement ST changes similar to prior Confirmed by Nanda Quinton [phone]) on 12/13/2022 9:53:55 PM  I have reviewed the labs performed to date as well as medications administered while in observation.  Recent changes in the last 24 hours include initial med clearance.  Plan  Current plan is for reassessment by psychiatry this am.    Dorie Rank, MD 12/14/22 [phone] Patient was evaluated by psychiatry.  Patient is cleared for discharge   Dorie Rank, MD 12/14/22 1345

## 2024-01-13 ENCOUNTER — Emergency Department (HOSPITAL_COMMUNITY)
Admission: EM | Admit: 2024-01-13 | Discharge: 2024-01-17 | Disposition: A | Discharge status: Home / Self Care | Payer: MEDICAID | Attending: Emergency Medicine | Admitting: Emergency Medicine

## 2024-01-13 DIAGNOSIS — R4689 Other symptoms and signs involving appearance and behavior: Secondary | ICD-10-CM

## 2024-01-13 DIAGNOSIS — F209 Schizophrenia, unspecified: Secondary | ICD-10-CM | POA: Insufficient documentation

## 2024-01-13 DIAGNOSIS — F419 Anxiety disorder, unspecified: Secondary | ICD-10-CM | POA: Diagnosis not present

## 2024-01-13 DIAGNOSIS — Z79899 Other long term (current) drug therapy: Secondary | ICD-10-CM | POA: Insufficient documentation

## 2024-01-13 DIAGNOSIS — F29 Unspecified psychosis not due to a substance or known physiological condition: Secondary | ICD-10-CM | POA: Diagnosis not present

## 2024-01-13 DIAGNOSIS — F1220 Cannabis dependence, uncomplicated: Secondary | ICD-10-CM | POA: Diagnosis not present

## 2024-01-13 DIAGNOSIS — F4323 Adjustment disorder with mixed anxiety and depressed mood: Secondary | ICD-10-CM | POA: Diagnosis present

## 2024-01-13 LAB — CBC WITH DIFFERENTIAL/PLATELET
Abs Immature Granulocytes: 0.03 10*3/uL (ref 0.00–0.07)
Basophils Absolute: 0 10*3/uL (ref 0.0–0.1)
Basophils Relative: 0 %
Eosinophils Absolute: 0 10*3/uL (ref 0.0–0.5)
Eosinophils Relative: 0 %
HCT: 49 % (ref 39.0–52.0)
Hemoglobin: 15.6 g/dL (ref 13.0–17.0)
Immature Granulocytes: 0 %
Lymphocytes Relative: 24 %
Lymphs Abs: 1.9 10*3/uL (ref 0.7–4.0)
MCH: 28.3 pg (ref 26.0–34.0)
MCHC: 31.8 g/dL (ref 30.0–36.0)
MCV: 88.8 fL (ref 80.0–100.0)
Monocytes Absolute: 0.5 10*3/uL (ref 0.1–1.0)
Monocytes Relative: 7 %
Neutro Abs: 5.3 10*3/uL (ref 1.7–7.7)
Neutrophils Relative %: 69 %
Platelets: 238 10*3/uL (ref 150–400)
RBC: 5.52 MIL/uL (ref 4.22–5.81)
RDW: 12.2 % (ref 11.5–15.5)
WBC: 7.7 10*3/uL (ref 4.0–10.5)
nRBC: 0 % (ref 0.0–0.2)

## 2024-01-13 LAB — COMPREHENSIVE METABOLIC PANEL
ALT: 18 U/L (ref 0–44)
AST: 16 U/L (ref 15–41)
Albumin: 4.4 g/dL (ref 3.5–5.0)
Alkaline Phosphatase: 54 U/L (ref 38–126)
Anion gap: 9 (ref 5–15)
BUN: 16 mg/dL (ref 6–20)
CO2: 25 mmol/L (ref 22–32)
Calcium: 9.2 mg/dL (ref 8.9–10.3)
Chloride: 103 mmol/L (ref 98–111)
Creatinine, Ser: 1 mg/dL (ref 0.61–1.24)
GFR, Estimated: >60 mL/min (ref >60)
Glucose, Bld: 90 mg/dL (ref 70–99)
Potassium: 3.6 mmol/L (ref 3.5–5.1)
Sodium: 137 mmol/L (ref 135–145)
Total Bilirubin: 0.9 mg/dL (ref 0.0–1.2)
Total Protein: 8 g/dL (ref 6.5–8.1)

## 2024-01-13 LAB — ETHANOL: Alcohol, Ethyl (B): <10 mg/dL (ref <10)

## 2024-01-13 LAB — SALICYLATE LEVEL: Salicylate Lvl: <7 mg/dL — ABNORMAL LOW (ref 7.0–30.0)

## 2024-01-13 LAB — ACETAMINOPHEN LEVEL: Acetaminophen (Tylenol), Serum: <10 ug/mL — ABNORMAL LOW (ref 10–30)

## 2024-01-13 MED ORDER — AMLODIPINE BESYLATE 5 MG PO TABS
5.0000 mg | ORAL_TABLET | Freq: Every day | ORAL | Status: DC
  Administered 2024-01-15 – 2024-01-17 (×3): 5 mg via ORAL
  Filled 2024-01-13 (×3): qty 1

## 2024-01-13 NOTE — ED Triage Notes (Signed)
 Pt from home, family called , reports pt that is not talking to anymore , this is typically his first step in his mental break. Pt with hx of possible schizophrenia, off med.

## 2024-01-13 NOTE — ED Provider Notes (Cosign Needed Addendum)
 [place] EMERGENCY DEPARTMENT AT [facility] Provider Note   CSN: [id number] Arrival date & time: 01/13/24  2021     History  Chief Complaint  Patient presents with   Mental Health Problem    [facility] is a 23 y.o. male brought in by family members from home.  They report patient has not been talking like normal and this is typically his first step in a mental breakdown.  Family report he may have a history of schizophrenia and is not currently taking medications.  When I attempt to talk to patient, he is not answering any my questions.   Mental Health Problem Presenting symptoms: no suicidal thoughts        Home Medications Prior to Admission medications   Medication Sig Start Date End Date Taking? Authorizing Provider  amLODipine (NORVASC) 5 MG tablet Take 1 tablet (5 mg total) by mouth daily. 02/18/22 04/07/22  Alvira Monday, MD  OLANZapine zydis (ZYPREXA) 5 MG disintegrating tablet Take 1 tablet (5 mg total) by mouth at bedtime for 7 days. 12/14/22 12/21/22  Motley-Mangrum, Ezra Sites, PMHNP  OVER THE COUNTER MEDICATION Take 1 tablet by mouth See admin instructions. Hydroxycut on days when working out    [provider]  sertraline (ZOLOFT) 25 MG tablet Take 1 tablet (25 mg total) by mouth daily for 7 days. 12/14/22 12/21/22  Motley-Mangrum, Ezra Sites, PMHNP      Allergies    Patient has no known allergies.    Review of Systems   Review of Systems  Psychiatric/Behavioral:  Positive for behavioral problems. Negative for suicidal ideas.     Physical Exam Updated Vital Signs BP 131/77 (BP Location: Right Arm)   Pulse (!) 50   Temp 97.9 F (36.6 C) (Bladder)   Resp 20   SpO2 98%  Physical Exam Vitals and nursing note reviewed.  Constitutional:      General: He is not in acute distress.    Appearance: He is well-developed.     Comments: Well-appearing, but not answering any questions. Moving slowly but reacting to me being at bedside, looking at  me   HENT:     Head: Normocephalic and atraumatic.  Eyes:     Conjunctiva/sclera: Conjunctivae normal.  Cardiovascular:     Rate and Rhythm: Normal rate and regular rhythm.     Heart sounds: No murmur heard. Pulmonary:     Effort: Pulmonary effort is normal. No respiratory distress.     Breath sounds: Normal breath sounds.  Abdominal:     Palpations: Abdomen is soft.     Tenderness: There is no abdominal tenderness.  Musculoskeletal:        General: No swelling.     Cervical back: Neck supple.  Skin:    General: Skin is warm and dry.     Capillary Refill: Capillary refill takes less than 2 seconds.  Neurological:     Mental Status: He is alert.  Psychiatric:        Mood and Affect: Mood normal.     ED Results / Procedures / Treatments   Labs (all labs ordered are listed, but only abnormal results are displayed) Labs Reviewed  SALICYLATE LEVEL - Abnormal; Notable for the following components:      Result Value   Salicylate Lvl <7.0 (*)    All other components within normal limits  ACETAMINOPHEN LEVEL - Abnormal; Notable for the following components:   Acetaminophen (Tylenol), Serum <10 (*)    All other components  within normal limits  COMPREHENSIVE METABOLIC PANEL  ETHANOL  CBC WITH DIFFERENTIAL/PLATELET  RAPID URINE DRUG SCREEN, HOSP PERFORMED    EKG None  Radiology No results found.  Procedures Procedures    Medications Ordered in ED Medications  amLODipine (NORVASC) tablet 5 mg (has no administration in time range)    ED Course/ Medical Decision Making/ A&P Clinical Course as of 01/13/24 2329  Mon Jan 13, 2024  2056 Attempted to [facility] patient, patient has been in the restroom [AF]    Clinical Course User Index [AF] Arabella Merles, PA-C                                 Medical Decision Making Amount and/or Complexity of Data Reviewed Labs: ordered.  Risk Prescription drug management. Decision regarding hospitalization.     Differential  diagnosis includes but is not limited to major depressive disorder, mania, medication reaction  ED Course:  Patient well-appearing, no acute distress.  Vital signs stable.  Physical exam unremarkable.  I Ordered, and personally interpreted labs.  The pertinent results include:  CBC and CMP without any abnormalities.  Slight level, acetaminophen level, and ethanol level within normal limits.  I spoke to patient's mom, she reports concern for "psychotic episodes".  States patient has been very quiet over the last couple of days.  She says patient has not been exhibiting any SI or HI.  When I asked patient is, he does not endorse any SI or HI either.  No indication for IVC at this time.  He is here voluntarily.   Medically cleared for psychiatric consult.  Home medications have been ordered.    Impression: Difference in behavior  Disposition:  Patient awaiting psychiatry consult  Consultations Obtained: I requested consultation with the psychiatry/TTS  External records from outside source obtained and reviewed including previous evaluation by psychiatry on 12/14/2022 where he did not meet criteria for inpatient treatment, prescribed 7 days of Zoloft and Zyprexa.   Spoke to patient's Mom Cyncyriae Atkins at [phone]. She states he goes through "psychotic episodes". She says he has been very quiet over the last couple of days. Sometimes violent but says that has not happened recently. Supposed to be on meds, unsure which ones. States he is not taking them. States he has not mentioned any thoughts of hurting himself or others.               Final Clinical Impression(s) / ED Diagnoses Final diagnoses:  Change in behavior    Rx / DC Orders ED Discharge Orders     None         Arabella Merles, PA-C 01/13/24 2329    Arabella Merles, PA-C 01/13/24 2329    Loetta Rough, MD 01/14/24 0004

## 2024-01-14 MED ORDER — LORAZEPAM 1 MG PO TABS
1.0000 mg | ORAL_TABLET | Freq: Three times a day (TID) | ORAL | Status: DC
  Administered 2024-01-14 – 2024-01-17 (×7): 1 mg via ORAL
  Filled 2024-01-14 (×7): qty 1

## 2024-01-14 MED ORDER — OLANZAPINE 5 MG PO TBDP
5.0000 mg | ORAL_TABLET | Freq: Every day | ORAL | Status: DC
  Administered 2024-01-14 – 2024-01-16 (×3): 5 mg via ORAL
  Filled 2024-01-14 (×3): qty 1

## 2024-01-14 MED ORDER — SERTRALINE HCL 50 MG PO TABS
50.0000 mg | ORAL_TABLET | Freq: Every day | ORAL | Status: DC
  Administered 2024-01-14 – 2024-01-17 (×4): 50 mg via ORAL
  Filled 2024-01-14 (×4): qty 1

## 2024-01-14 NOTE — BH Assessment (Signed)
 Disposition Social Work Note:   Patient was recommended inpatient per [name] (NP). There are no available beds at [facility], per [facility] [name] RN. Patient was re-referred/re-faxed to the following out of network facilities during this pm shift for consideration of bed placement:    Destination  Service Provider Address Phone Fax  [facility] Patient Placement [facility], [place] [phone]  [facility] [address] [place] [address] [phone]  [facility] [address] [place], [place] [address] [phone]  [facility] [address] [place] [address] [phone]  [facility] [address] [place] [address] [phone]  [facility] [address] [place] [address] [phone]  [facility] [address], [place] [address] [phone]  [facility] EFAX [address] [place], [place] [phone]  [facility] [address] [place] [address] [phone]  [facility] [address] [place], [place] [address]

## 2024-01-14 NOTE — ED Notes (Signed)
 Pt had telecom with psych, but pt was nonverbal and did not answer question to psych provider. Pt qualified for in pt admission.

## 2024-01-14 NOTE — ED Notes (Cosign Needed)
 Patient is currently resting with family beside him. Symmetrical rise and fall of the chest noted.

## 2024-01-14 NOTE — BH Assessment (Addendum)
 Comprehensive Clinical Assessment (CCA) Note  01/14/2024 Gabriel Nelson [id number] Disposition: Patient care discussed with [name], NP.  She recommended inpatient psychiatric care.  Patient disposition recommendation given to RN [facility] via secure messaging.  Patient did not answer any questions.  He stared and did not make any vocalizations.    Patient reportedly is not taking his medications.  Unknown if he has any outpatient care.     Chief Complaint:  Chief Complaint  Patient presents with   Mental Health Problem   Visit Diagnosis: Psychotic d/o    CCA Screening, Triage and Referral (STR)  Patient Reported Information How did you hear about [place]? Family/Friend (Family memers brought patient to [facility].)  What Is the Reason for Your Visit/Call Today? PA Arabella Merles documented "is a 23 y.o. male brought in by family members from home.  They report patient has not been talking like normal and this is typically his first step in a mental breakdown.  Family report he may have a history of schizophrenia and is not currently taking medications.  When I attempt to talk to patient, he is not answering any of  my questions."  She went on to documented that "I spoke to patient's mom, she reports concern for "psychotic episodes".  States patient has been very quiet over the last couple of days.  She says patient has not been exhibiting any SI or HI.  When I asked patient is, he does not endorse any SI or HI either.  No indication for IVC at this time.  He is here voluntarily."  When this clinician saw patient, patien tdid not respond to any questions at all.  He stared and did not make any vocalizations.  How Long Has This Been Causing You Problems? <Week  What Do You Feel Would Help You the Most Today? Treatment for Depression or other mood problem   Have You Recently Had Any Thoughts About Hurting Yourself? -- (Pt does not respond.)  Are You Planning to Commit Suicide/Harm Yourself At  This time? -- (Pt does not respond.)   Flowsheet Row ED from 01/13/20[facility] in [facility] from 12/13/2022 in [facility] at [facility] ED from 12/07/2022 in [facility] Department at [facility]  C-SSRS RISK CATEGORY No Risk No Risk No Risk       Have you Recently Had Thoughts About Hurting Someone [name]? -- (Pt does not respond)  Are You Planning to Harm Someone at This Time? -- (Pt does not respond.)  Explanation: Pt does not respond to question.   Have You Used Any Alcohol or Drugs in the Past 24 Hours? -- (Pt does not respond to question.)  How Long Ago Did You Use Drugs or Alcohol? No data recorded What Did You Use and How Much? No data recorded  Do You Currently Have a Therapist/Psychiatrist? -- (Pt does not respond to question.)  Name of Therapist/Psychiatrist:    Have You Been Recently Discharged From Any Office Practice or Programs? -- (Pt does not respond to question.)  Explanation of Discharge From Practice/Program: No data recorded    CCA Screening Triage Referral Assessment Type of Contact: Tele-Assessment  Telemedicine Service Delivery:   Is this Initial or Reassessment? Is this Initial or Reassessment?: Initial Assessment  Date Telepsych consult ordered in [facility]:  Date Telepsych consult ordered in [facility]: 01/13/24  Time Telepsych consult ordered in [facility]:  Time Telepsych consult ordered in [facility]: 2323  Location of Assessment: [facility] ED  Provider Location: [facility] Assessment Services   Collateral Involvement: PA Arabella Merles contacted pt's mother Mendel Corning [phone]   Does Patient Have a Court Appointed Legal Guardian? No  Legal Guardian Contact Information: Pt has no legal guardian  Copy of Legal Guardianship Form: -- (Pt has no legal guardian)  Legal Guardian Notified of Arrival: -- (Pt has no legal guardian)  Legal Guardian Notified of Pending Discharge:  -- (Pt has no legal guardian)  If Minor and Not Living with Parent(s), Who has Custody? Pt is an adult  Is CPS involved or ever been involved? Never  Is APS involved or ever been involved? Never   Patient Determined To Be At Risk for Harm To Self or Others Based on Review of Patient Reported Information or Presenting Complaint? -- (Pt has voiced no SI or HI.)  Method: -- (Pt has voiced no SI or HI.)  Availability of Means: -- (Pt has voiced no SI or HI.)  Intent: -- (Pt has voiced no SI or HI.)  Notification Required: -- (Pt has voiced no SI or HI.)  Additional Information for Danger to Others Potential: -- (Pt has voiced no SI or HI.)  Additional Comments for Danger to Others Potential: None noted  Are There Guns or Other Weapons in Your Home? No  Types of Guns/Weapons: None  Are These Weapons Safely Secured?                            No  Who Could Verify You Are Able To Have These Secured: No gun reported  Do You Have any Outstanding Charges, Pending Court Dates, Parole/Probation? Pt as on probation as of last January '24.  Contacted To Inform of Risk of Harm To Self or Others: -- (Pt did not respond to question.)    Does Patient Present under Involuntary Commitment? No    [place] of Residence: [place]   Patient Currently Receiving the Following Services: Not Receiving Services (Unknown)   Determination of Need: Urgent (48 hours)   Options For Referral: Inpatient Hospitalization     CCA Biopsychosocial Patient Reported Schizophrenia/Schizoaffective Diagnosis in Past: No   Strengths: Patient has family support   Mental Health Symptoms Depression:  -- (Pt did not respond to question.)   Duration of Depressive symptoms:    Mania:  -- (Pt did not respond to question.)   Anxiety:   -- (Pt did not respond to question.)   Psychosis:  -- (Pt did not respond to question.)   Duration of Psychotic symptoms:    Trauma:  -- (Pt did not respond to  question.)   Obsessions:  -- (Pt did not respond to question.)   Compulsions:  -- (Pt did not respond to question.)   Inattention:  -- (Pt did not respond to question.)   Hyperactivity/Impulsivity:  -- (Pt did not respond to question.)   Oppositional/Defiant Behaviors:  -- (Pt did not respond to question.)   Emotional Irregularity:  Mood lability   Other Mood/Personality Symptoms:  None noted    Mental Status Exam Appearance and self-care  Stature:  Average   Weight:  Average weight   Clothing:  -- (In scrubs)   Grooming:  Normal   Cosmetic use:  None   Posture/gait:  Normal   Motor activity:  Not Remarkable   Sensorium  Attention:  Inattentive   Concentration:  Preoccupied   Orientation:  -- (Pt did not  respond to question.)   Recall/memory:  Normal   Affect and Mood  Affect:  Blunted; Flat   Mood:  Dysphoric   Relating  Eye contact:  Staring   Facial expression:  Depressed   Attitude toward examiner:  Dependent; Passive; Uninterested   Thought and Language  Speech flow: -- (Pt did nto speak.)   Thought content:  -- (Pt did not respond to question.)   Preoccupation:  None   Hallucinations:  None (Pt did not respond to question.)   Organization:  Other (Comment) (Pt did not respond to question.)   [employer] of Knowledge:  Fair   Intelligence:  Average   Abstraction:  Normal   Judgement:  -- (Pt did not respond to question.)   Reality Testing:  -- (Pt did not respond to question.)   Insight:  Unaware; None/zero insight   Decision Making:  -- (Pt did not respond to question.)   Social Functioning  Social Maturity:  -- (Pt did not respond to question.)   Social Judgement:  -- (Pt did not respond to question.)   Stress  Stressors:  -- (Pt did not respond to question.)   Coping Ability:  Overwhelmed   Skill Deficits:  Self-care; Responsibility; Decision making (Pt did not respond to question.)   Supports:   Friends/Service system; Family     Religion: Religion/Spirituality Are You A Religious Person?: Yes What is Your Religious Affiliation?: Christian How Might This Affect Treatment?: Not in the slightest  Leisure/Recreation: Leisure / Recreation Do You Have Hobbies?:  (Pt did not respond to question.)  Exercise/Diet: Exercise/Diet Do You Exercise?:  (Pt did not respond to question.) Have You Gained or Lost A Significant Amount of Weight in the Past Six Months?:  (Pt did not respond to question.) Do You Follow a Special Diet?:  (Pt did not respond to question.) Do You Have Any Trouble Sleeping?:  (Pt did not respond to question.)   CCA Employment/Education Employment/Work Situation: Employment / Work Situation Employment Situation: Unemployed Patient's Job has Been Impacted by Current Illness: No Has Patient ever Been in Equities trader?: No  Education: Education Is Patient Currently Attending School?: No Last Grade Completed: 12 Did You Product manager?: No Did You Have An Individualized Education Program (IIEP): No Did You Have Any Difficulty At [employer]?: No Patient's Education Has Been Impacted by Current Illness: No   CCA Family/Childhood History Family and Relationship History: Family history Marital status: Single Does patient have children?: Yes How many children?: 2 How is patient's relationship with their children?: One newborn and a 75 year old stepson  Childhood History:  Childhood History By whom was/is the patient raised?: Mother, Father Did patient suffer any verbal/emotional/physical/sexual abuse as a child?: No Did patient suffer from severe childhood neglect?: No Has patient ever been sexually abused/assaulted/raped as an adolescent or adult?: No Was the patient ever a victim of a crime or a disaster?: No Witnessed domestic violence?: No Has patient been affected by domestic violence as an adult?: No       CCA Substance Use Alcohol/Drug  Use: Alcohol / Drug Use Pain Medications: None Prescriptions: Pt did not respond to question. Over the Counter: Pt did not respond to question. History of alcohol / drug use?: Yes Longest period of sobriety (when/how long): Four to five months Negative Consequences of Use: Legal Withdrawal Symptoms: None Substance #1 Name of Substance 1: Pt did not respond to question. 1 - Age of First Use: Pt did not respond  to question. 1 - Amount (size/oz): Pt did not respond to question. 1 - Frequency: Pt did not respond to question. 1 - Duration: Pt did not respond to question. 1 - Last Use / Amount: Pt did not respond to question. 1 - Method of Aquiring: Pt did not respond to question. 1- Route of Use: Pt did not respond to question.                       ASAM's:  Six Dimensions of Multidimensional Assessment  Dimension 1:  Acute Intoxication and/or Withdrawal Potential:      Dimension 2:  Biomedical Conditions and Complications:      Dimension 3:  Emotional, Behavioral, or Cognitive Conditions and Complications:     Dimension 4:  Readiness to Change:     Dimension 5:  Relapse, Continued use, or Continued Problem Potential:     Dimension 6:  Recovery/Living Environment:     ASAM Severity Score:    ASAM Recommended Level of Treatment:     Substance use Disorder (SUD)    Recommendations for Services/Supports/Treatments:    Disposition Recommendation per psychiatric provider: We recommend inpatient psychiatric hospitalization when medically cleared. Patient is under voluntary admission status at this time; please IVC if attempts to leave hospital.   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

## 2024-01-14 NOTE — Progress Notes (Signed)
 LCSW Progress Note  [id number]   Gabriel Nelson  01/14/2024  11:45 AM  Description:   Inpatient Psychiatric Referral  Patient was recommended inpatient per [name] (NP). There are no available beds at [facility], per [facility] [name] RN. Patient was referred to the following out of network facilities:   Destination  Service Provider Address Phone Fax  [facility], [place] [phone]  [facility] [address] [place] [address] [phone]  [facility] [address] [place], [place] [address] [phone]  [facility] [address] [place] [address] [phone]  [facility] [address] [place] [address] [phone]  [facility] [address] [place] [address] [phone]  [facility] [address], [place] [address] [phone]  [facility] EFAX [address] [place], [place] [phone]  [facility] [address] [place] [address] [phone]  [facility] [address] [place] [address] [phone]       Situation ongoing, CSW to continue following and update chart as more information becomes available.      [place] Melany Wiesman, MSW, LCSW  01/14/2024 11:45 AM

## 2024-01-14 NOTE — ED Notes (Signed)
 Pt belonging in cabinet 23-25 in top cabinet.

## 2024-01-14 NOTE — ED Provider Notes (Signed)
 Emergency Medicine Observation Re-evaluation Note  Gabriel Nelson is a 23 y.o. male, seen on rounds today.  Pt initially presented to the ED for complaints of Mental Health Problem Currently, the patient is awaiting inpatient psychiatric care.  Currently sleeping, no concerns per staff.   Physical Exam  BP 123/76 (BP Location: Right Arm)   Pulse (!) 53   Temp 97.8 F (36.6 C) (Oral)   Resp 18   SpO2 98%  Physical Exam General: NAD Cardiac: RR Lungs: even unlabored Psych: NA  ED Course / MDM  EKG:   I have reviewed the labs performed to date as well as medications administered while in observation.  Recent changes in the last 24 hours include none.  Plan  Current plan is for inpatient psychiatric care.    Alvira Monday, MD 01/14/24 [phone]

## 2024-01-15 LAB — RAPID URINE DRUG SCREEN, HOSP PERFORMED
Amphetamines: NOT DETECTED
Barbiturates: NOT DETECTED
Benzodiazepines: NOT DETECTED
Cocaine: NOT DETECTED
Opiates: NOT DETECTED
Tetrahydrocannabinol: POSITIVE — AB

## 2024-01-15 NOTE — Progress Notes (Signed)
 01/15/2024  [facility]ed [facility] to transport patient to [facility]. Per [facility], they are not able to transport patient that far due to road conditions. Informed them that per police patrol the roads are clear. [facility] still refused to take patient.

## 2024-01-15 NOTE — Progress Notes (Signed)
 Pt has been accepted to [facility] TODAY Bed assignment: 5 WEST    Pt meets inpatient criteria per: Phebe Colla NP  Attending Physician will be: Loanne Drilling MD   Report can be called to: [phone]  Pt can arrive ASAP    Care Team Notified: Phebe Colla NP, Elayne Snare DO  [place] Catrell Morrone LCSW-A   01/15/2024 2:18 PM

## 2024-01-15 NOTE — Progress Notes (Signed)
 LCSW Progress Note  [id number]   Gabriel Nelson  01/15/2024  1:01 PM  Description:   Inpatient Psychiatric Referral  Patient was recommended inpatient per Phebe Colla NP. There are no available beds at [facility], per [facility] [name] RN. Patient was referred to the following out of network facilities:   Destination  Service Provider Address Phone Fax  [facility], [place] [phone]  [facility] [address] [place] [address] [phone]  [facility] [address] [place], [place] [address] [phone]  [facility] [address] [place] [address] [phone]  [facility] [address], [place] [address] [phone]  [facility] [address], [place] [address] [phone]  [facility] [address] [place] [address] [phone]  [facility] EFAX [address] [place], [place] [phone]  [facility] [address] [place] [address] [phone]  [facility] [address] [place] [address] [phone]      Situation ongoing, CSW to continue following and update chart as more information becomes available.    Gabriel Nelson, MSW, LCSW  01/15/2024 1:01 PM

## 2024-01-15 NOTE — BH Assessment (Addendum)
 Disposition Social Work Note:  The patient has been accepted to [facility] today, with a bed assignment in 5 WEST.  The patient meets inpatient criteria per Phebe Colla, NP. The attending physician will be Loanne Drilling, MD. The report can be called to [phone]. The patient can arrive ASAP.  Care Team Notified: Phebe Colla, NP Elayne Snare, DO

## 2024-01-15 NOTE — ED Provider Notes (Addendum)
 Emergency Medicine Observation Re-evaluation Note  Gabriel Nelson is a 23 y.o. male, seen on rounds today.  Pt initially presented to the ED for complaints of Mental Health Problem Currently, the patient is asleep, no new concerns by staff.  Physical Exam  BP 125/89 (BP Location: Left Arm)   Pulse (!) 51   Temp 97.9 F (36.6 C) (Oral)   Resp 16   SpO2 100%  Physical Exam General: Asleep, no acute distress Cardiac: Regular rate Lungs: No increased WOB Psych: Calm, asleep  ED Course / MDM  EKG:   I have reviewed the labs performed to date as well as medications administered while in observation.  Recent changes in the last 24 hours include patient remains medically cleared, recommended for inpatient psych.  Plan  Current plan is for Inpatient psych.  2:20 PM Patient accepted to [facility] by Loanne Drilling MD today.    Elayne Snare K, DO 01/15/24 1420

## 2024-01-16 DIAGNOSIS — F29 Unspecified psychosis not due to a substance or known physiological condition: Secondary | ICD-10-CM | POA: Diagnosis not present

## 2024-01-16 LAB — RESP PANEL BY RT-PCR (RSV, FLU A&B, COVID)  RVPGX2
Influenza A by PCR: NEGATIVE
Influenza B by PCR: NEGATIVE
Resp Syncytial Virus by PCR: NEGATIVE
SARS Coronavirus 2 by RT PCR: NEGATIVE

## 2024-01-16 NOTE — ED Notes (Signed)
 Pt care taken, resting, in room quiet, no complaints at this time.

## 2024-01-16 NOTE — Consult Note (Signed)
 [facility] Psychiatric Consult Follow-up  Patient Name: .Shashank Kwasnik  MRN: [id number]  DOB: [birth year]  Consult Order details:  Orders (From admission, onward)     Start     Ordered   01/13/24 2323  CONSULT TO CALL ACT TEAM       Ordering Provider: Arabella Merles, PA-C  Provider:  (Not yet assigned)  Question:  Reason for Consult?  Answer:  Psych consult   01/13/24 2323             Mode of Visit: In person    Psychiatry Consult Evaluation  Service Date: January 16, 2024 LOS:  LOS: 0 days  Chief Complaint "possible mental breakdown"  Primary Psychiatric Diagnoses  Schizophrenia 2.   Anxiety  Assessment  Donivin Wirt is a 23 y.o. male admitted: Presented to the ED on 01/13/2024  8:30 PM for "possible mental breakdown". He carries the psychiatric diagnoses of schizophrenia and has a past medical history of rhabdomyolysis.    [name], 23 y.o., male patient seen face to face by this provider, consulted with Dr. Woodroe Mode; and chart reviewed on 01/16/24.  On evaluation Nolon Yellin reports that he is currently living with his mother and 5 siblings.  He states his father is mildly supportive and lives in [place].  He states he is currently not working, and has not had a job in about a year.  Patient states he does not do a lot throughout the day, he states he takes walks, playing video games, sometimes exercises.  He states he does isolate a lot from time to time, due to feeling like he needs his own space.  Patient endorses using marijuana 3-4 times a month denies using any other illicit substances or alcohol.  He states he is not compliant with his medications, due to not being able to afford them states the last time he took his Zoloft and Zyprexa was about a year ago.  He states he has been admitted to [facility] about a year ago.  Patient denies any access to firearms, states that his appetite and sleep are fair.  During evaluation Beauford Lando is sitting up in bed and  appears to be in no acute distress.  He is alert, oriented x 3, calm, cooperative and attentive.  His mood is dysphoric with congruent affect.  He has slow speech, and slow behavior.   Unsure if patient is responding to internal stimuli or having some thought blocking as he appears to be thinking about what he has to say next, and very slow to respond, very slow movements.  Patient denies SI/HI/AVH. Patient is able to converse coherently, goal directed thoughts, no distractibility, or pre-occupation.  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  Patient denies having any access to firearms.  Patient states he is ready to go back home with his family.   Diagnoses:  Active Hospital problems: Principal Problem:   Psychosis (HCC) Active Problems:   Adjustment disorder with mixed anxiety and depressed mood    Plan   ## Psychiatric Medication Recommendations:  Continue patient on home medications  ## Medical Decision Making Capacity: Not specifically addressed in this encounter  ##  Further Work-up:  -- No further workup needed at this time EKG or UDS -- most recent EKG on 01/14/24 had QtC of 404 -- Pertinent labwork reviewed earlier this admission includes: CMP, CBC, EKG, UDS   ## Disposition:--We will monitor patient overnight, until we are able to get collateral from patient's mother for possible discharge.  ## Behavioral / Environmental: -To minimize splitting of staff, assign one staff person to communicate all information from the team when feasible. or Utilize compassion and acknowledge the patient's experiences while setting clear and realistic expectations for care.    ## Safety and Observation Level:  -  Based on my clinical evaluation, I estimate the patient to be at no risk of self harm in the current setting. - At this time, we recommend  routine. This decision is based on my review of the chart including patient's history and current presentation, interview of the patient, mental status examination, and consideration of suicide risk including evaluating suicidal ideation, plan, intent, suicidal or self-harm behaviors, risk factors, and protective factors. This judgment is based on our ability to directly address suicide risk, implement suicide prevention strategies, and develop a safety plan while the patient is in the clinical setting. Please contact our team if there is a concern that risk level has changed.  CSSR Risk Category:C-SSRS RISK CATEGORY: No Risk  Suicide Risk Assessment: Patient has following modifiable risk factors for suicide: none, recommending the patient be monitored overnight until we are able to receive collateral from patient's mother. Patient has following non-modifiable or demographic risk factors for suicide: male gender and psychiatric hospitalization Patient has the following protective factors against suicide: Access to outpatient mental health care, Supportive family, and Minor children in the home  Thank you for this consult request. Recommendations have been communicated to the primary team.  We are recommending that the patient be monitored overnight until we are able to receive collateral from patient's mother. at this time.   Alona Bene, PMHNP       History of Present Illness  Relevant Aspects of Hospital ED Course:  Admitted on 01/13/2024 for "possible mental breakdown"  Patient Report:   [name], 23 y.o., male patient seen face to face by this provider, consulted with Dr. Woodroe Mode; and chart reviewed on 01/16/24.  On evaluation Armando Bukhari reports that he is currently living with his mother and 5 siblings.  He states his father is mildly  supportive and lives in [place].  He states he is currently not working, and has not had a job in about a year.  Patient states he does not do a lot throughout the day, he states he takes walks, playing video games, sometimes exercises.  He states he does isolate a lot from time to time, due to feeling like he needs his own space.  Patient endorses using marijuana 3-4 times a month denies using any other illicit substances or alcohol.  He states he is not compliant with his medications, due to not being able to afford them states the last time he took his Zoloft and Zyprexa was about a year ago.  He states he has been admitted to [facility] about a year ago.  Patient denies any access to firearms, states that his appetite and sleep are fair.  During evaluation Kaydence Baba is sitting up in bed and appears to be in no acute distress.  He is alert, oriented x 3, calm, cooperative and attentive.  His mood is dysphoric with congruent affect.  He has slow speech, and slow behavior.   Unsure if patient is responding to internal stimuli or having some thought blocking as he appears to be thinking about what he has to say next, and very slow to respond, very slow movements.  Patient denies SI/HI/AVH. Patient is able to converse coherently, goal directed thoughts, no distractibility, or pre-occupation.  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  Patient denies having any access to firearms.  Patient states he is ready to go back home with his family. Per chart review patient has not had any aggressive behaviors or had to receive any IM medications for agitation while here in the hospital.  Patient is open to receiving ACT services to  help him be compliant with medications.   Psych ROS:  Depression: Denies Anxiety:  Denies Mania (lifetime and current): Denies Psychosis: (lifetime and current): Lifetime  Collateral information:  Attempted to contact patient's mother, no answer will attempt again  Review of Systems  Psychiatric/Behavioral: Negative.       Psychiatric and Social History  Psychiatric History:  Information collected from patient and chart reviewed  Prev Dx/Sx: Schizophrenia Current Psych Provider: None Home Meds (current): See above Previous Med Trials: Yes Therapy: None  Prior Psych Hospitalization: Yes Prior Self Harm: Yes Prior Violence: No  Family Psych History: Unknown Family Hx suicide: Unknown  Social History:  Developmental Hx: Unknown Educational Hx: Patient graduated high school Occupational Hx: Patient unemployed Armed forces operational officer Hx: Yes, has court case coming up Living Situation: Lives with mother and siblings Spiritual Hx: [employer] Access to weapons/lethal means: Denies  Substance History Alcohol: Denies  Type of alcohol Denies Last Drink Unknown  Number of drinks per day Denies History of alcohol withdrawal seizures Denies History of DT's Denies Tobacco: Denies Illicit drugs: THC Prescription drug abuse: Denies Rehab hx: Denies  Exam Findings  Physical Exam:  Vital Signs:  Temp:  [97.6 F (36.4 C)-99 F (37.2 C)] 99 F (37.2 C) (02/20 1346) Pulse Rate:  [48-70] 70 (02/20 1346) Resp:  [17-18] 18 (02/20 1346) BP: (114-130)/(68-82) 125/81 (02/20 1346) SpO2:  [99 %-100 %] 100 % (02/20 1346) Blood pressure 125/81, pulse 70, temperature 99 F (37.2 C), temperature source Oral, resp. rate 18, SpO2 100%. There is no height or weight on file to calculate BMI.  Physical Exam Neurological:     Mental Status: He is alert.  Psychiatric:        Attention and Perception: Attention normal.        Mood and Affect: Mood normal. Affect is flat.        Speech: Speech is delayed.         Behavior: Behavior is cooperative.        Thought Content: Thought content normal.     Mental Status Exam: General Appearance: Disheveled  Orientation:  Full (Time, Place, and Person)  Memory:  Immediate;   Fair Remote;   Fair  Concentration:  Concentration: Fair and Attention Span: Fair  Recall:  Fair  Attention  Fair  Eye Contact:  Fair  Speech:  Clear and Coherent and Slow  Language:  Fair  Volume:  Decreased  Mood: dysphoric  Affect:  Congruent  Thought Process:  Coherent  Thought Content:  WDL  Suicidal Thoughts:  No  Homicidal Thoughts:  No  Judgement:  Poor  Insight:  Lacking  Psychomotor Activity:  Normal  Akathisia:  No  Fund of Knowledge:  Fair      Assets:  Manufacturing systems engineer Desire for Improvement Social Support  Cognition:  WNL  ADL's:  Intact  AIMS (if indicated):        Other History   These have been pulled in through the EMR, reviewed, and updated if appropriate.  Family History:  The patient's family history includes Healthy in his mother.  Medical History: Past Medical History:  Diagnosis Date   ADHD    Depression    Hypertension    Medical history non-contributory     Surgical History: Past Surgical History:  Procedure Laterality Date   NO PAST SURGERIES       Medications:   Current Facility-Administered Medications:    amLODipine (NORVASC) tablet 5 mg, 5 mg, Oral, Daily, Truddie Hidden, Amanda, PA-C, 5 mg at 01/16/24 0908   LORazepam (ATIVAN) tablet 1 mg, 1 mg, Oral, Q8H, Onuoha, Josephine C, NP, 1 mg at 01/16/24 1406   OLANZapine zydis (ZYPREXA) disintegrating tablet 5 mg, 5 mg, Oral, QHS, Onuoha, Josephine C, NP, 5 mg at 01/15/24 2137   sertraline (ZOLOFT) tablet 50 mg, 50 mg, Oral, Daily, Onuoha, Josephine C, NP, 50 mg at 01/16/24 0908  Current Outpatient Medications:    amLODipine (NORVASC) 5 MG tablet, Take 1 tablet (5 mg total) by mouth daily., Disp: 30 tablet, Rfl: 0   OLANZapine zydis (ZYPREXA) 5 MG disintegrating  tablet, Take 1 tablet (5 mg total) by mouth at bedtime for 7 days., Disp: 7 tablet, Rfl: 0   OVER THE COUNTER MEDICATION, Take 1 tablet by mouth See admin instructions. Hydroxycut on days when working out, Disp: , Rfl:    sertraline (ZOLOFT) 25 MG tablet, Take 1 tablet (25 mg total) by mouth daily for 7 days., Disp: 7 tablet, Rfl: 0  Allergies: No Known Allergies  Natausha Jungwirth MOTLEY-MANGRUM, PMHNP

## 2024-01-16 NOTE — ED Provider Notes (Signed)
 Attempted to call patient mother Sande Brothers x2, to get collateral, patient voicemail full, unable to leave a voicemail.

## 2024-01-16 NOTE — ED Notes (Addendum)
 Pt woke up used restroom laid back down

## 2024-01-16 NOTE — ED Provider Notes (Signed)
 Emergency Medicine Observation Re-evaluation Note  Gabriel Nelson is a 23 y.o. male, seen on rounds today.  Pt initially presented to the ED for complaints of Mental Health Problem Currently, the patient is calm   Physical Exam  BP 114/68 (BP Location: Left Arm)   Pulse (!) 48   Temp 97.6 F (36.4 C) (Oral)   Resp 16   SpO2 100%  Physical Exam General: NAD Cardiac: RR Lungs: Non-labored  Psych: Calm   ED Course / MDM  EKG:EKG Interpretation Date/Time:  Tuesday January 14 2024 09:41:42 EST Ventricular Rate:  58 PR Interval:  160 QRS Duration:  84 QT Interval:  412 QTC Calculation: 404 R Axis:   55  Text Interpretation: Sinus bradycardia Nonspecific T wave abnormality Abnormal ECG  Prior t-wave inversions resolved Confirmed by Elayne Snare ([id number]) on 01/15/2024 7:11:36 AM  I have reviewed the labs performed to date as well as medications administered while in observation.  Recent changes in the last 24 hours include accepted to inpatient.  Plan  Current plan is for inpatient; transport delayed yesterday due to snow.     Coral Spikes, DO 01/16/24 [phone]

## 2024-01-16 NOTE — ED Notes (Signed)
 Safe Transport on location. Patient refused to go.

## 2024-01-16 NOTE — ED Notes (Signed)
 Patient refusing to go to next location, not IVCd , AAO x4.

## 2024-01-17 DIAGNOSIS — F29 Unspecified psychosis not due to a substance or known physiological condition: Secondary | ICD-10-CM

## 2024-01-17 MED ORDER — SERTRALINE HCL 50 MG PO TABS
50.0000 mg | ORAL_TABLET | Freq: Every day | ORAL | 0 refills | Status: DC
Start: 2024-01-18 — End: 2024-07-01

## 2024-01-17 MED ORDER — AMLODIPINE BESYLATE 5 MG PO TABS
5.0000 mg | ORAL_TABLET | Freq: Every day | ORAL | 0 refills | Status: DC
Start: 2024-01-17 — End: 2024-07-01

## 2024-01-17 MED ORDER — OLANZAPINE 5 MG PO TBDP: 5.0000 mg | ORAL_TABLET | Freq: Every day | ORAL | 0 refills | Status: DC

## 2024-01-17 NOTE — Consult Note (Signed)
 [facility] Psychiatric Consult Follow-up  Patient Name: .Gabriel Nelson  MRN: [id number]  DOB: 2001-01-29  Consult Order details:  Orders (From admission, onward)     Start     Ordered   01/13/24 2323  CONSULT TO CALL ACT TEAM       Ordering Provider: Arabella Merles, PA-C  Provider:  (Not yet assigned)  Question:  Reason for Consult?  Answer:  Psych consult   01/13/24 2323             Mode of Visit: In person    Psychiatry Consult Evaluation  Service Date: January 17, 2024 LOS:  LOS: 0 days  Chief Complaint "possible mental breakdown"   Primary Psychiatric Diagnoses  Schizophrenia  Anxiety THC dependence   Assessment  Gabriel Nelson is a 23 y.o. male admitted Presented to the ED on 01/13/2024  8:30 PM for "possible mental breakdown". He carries the psychiatric diagnoses of schizophrenia and has a past medical history of rhabdomyolysis.      Gabriel Nelson, 23 y.o., male patient seen face to face by this provider, consulted with Dr. Woodroe Mode; and chart reviewed on 01/17/24.  On evaluation today, the patient is sitting up in bed, and appears to be in no acute distress.  He is calm and cooperative during this assessment. His appearance is appropriate for environment. His eye contact is good.  Speech is clear and coherent, decreased pace and decreased volume. He is alert and oriented x 3 to person, place, time, and situation. He reports his mood is euthymic.  Affect is congruent with mood. Thought process is coherent.  Thought content is within normal limits. He denies auditory and visual hallucinations.  No indication that he is responding to internal stimuli during this assessment.  No delusions elicited during this assessment. He denies suicidal ideations.  He denies homicidal ideations. Appetite and sleep are fair. Patient able to contract for safety. Patient denies having any access to firearms.  Patient states he is ready to go back home with his family. This provider talked with  him about receiving services through [facility] and let him know that a referral has been made. Patient continues to say he is open to going to [facility] and is in agreement that he needs resources to help him be accountable with his mental health and medications.    Diagnoses:  Active Hospital problems: Principal Problem:   Psychosis (HCC) Active Problems:   Adjustment disorder with mixed anxiety and depressed mood    Plan   ## Psychiatric Medication Recommendations:  Continue patient on home medications   ## Medical Decision Making Capacity: Not specifically addressed in this encounter  ## Further Work-up:  -- No further workup needed at this time EKG or UDS -- most recent EKG on 01/14/24 had QtC of 404 -- Pertinent labwork reviewed earlier this admission includes: CMP, CBC, EKG, UDS   ## Disposition:-- Patient is psychiatrically cleared. Patient case review and discussed with Dr. Woodroe Mode, and patient does not meet inpatient criteria for inpatient psychiatric treatment. At time of discharge, patient denies SI, HI, AVH and can contract for safety. He demonstrated no overt evidence of psychosis or mania. Prior to discharge, he verbalized that they understood warning signs, triggers, and symptoms of worsening mental health and how to access emergency mental health care if they felt it was needed. Patient was instructed to call 911 or return to the emergency room if they experienced any concerning symptoms after discharge. Patient voiced understanding and agreed to the above.  Patient given resources to follow up with behavioral health urgent care. Patient also given a referral to follow up with [facility] for ACTT team services for therapy and medication management. Patient denies access to weapons. Safety planning completed. Patient sent home with 30 day supply of medications of Norvasc 5 mg PO daily for hypertension, Zyprexa 5 mg PO @ HS for mood/psychosis, and Zoloft 50 mg PO daily for depression.  Patient mother and sister will be here to pick patient up about 1230p or 1p.   Safety Plan Candace Ramus will reach out to his mother Sande Brothers, call 911 or call mobile crisis, or go to nearest emergency room if condition worsens or if suicidal thoughts become active Patients' will follow up with [facility] or behavioral health urgent care for outpatient psychiatric services (therapy/medication management).  The suicide prevention education provided includes the following: Suicide risk factors Suicide prevention and interventions National Suicide Hotline telephone number [facility] assessment telephone number [facility] Emergency Assistance 911 [facility] and/or Residential Mobile Crisis Unit telephone number Request made of family/significant other to:  mother Cyncirae Atkins Remove weapons (e.g., guns, rifles, knives), all items previously/currently identified as safety concern.   Remove drugs/medications (over the counter, prescriptions, illicit drugs), all items previously/currently identified as a safety concern.   ## Behavioral / Environmental: - No specific recommendations at this time.     ## Safety and Observation Level:  - Based on my clinical evaluation, I estimate the patient to be at no risk of self harm in the current setting. - At this time, we recommend  routine. This decision is based on my review of the chart including patient's history and current presentation, interview of the patient, mental status examination, and consideration of suicide risk including evaluating suicidal ideation, plan, intent, suicidal or self-harm behaviors, risk factors, and protective factors. This judgment is based on our ability to directly address suicide risk, implement suicide prevention strategies, and develop a safety plan while the patient is in the clinical setting. Please contact our team if there is a concern that risk level has changed.  CSSR Risk Category:C-SSRS  RISK CATEGORY: No Risk  Suicide Risk Assessment: Patient has following modifiable risk factors for suicide: none, which we are addressing by recommending that patient be psychiatrically cleared. Patient has following non-modifiable or demographic risk factors for suicide: male gender and psychiatric hospitalization Patient has the following protective factors against suicide: Access to outpatient mental health care, Supportive family, and Minor children in the home  Thank you for this consult request. Recommendations have been communicated to the primary team.  We will recommend that patient be psychiatrically cleared. at this time.   Alona Bene, PMHNP       History of Present Illness  Relevant Aspects of Hospital ED Course:  Admitted on 01/13/2024 for  "possible mental breakdown"   Patient Report:  Gabriel Nelson, 23 y.o., male patient seen face to face by this provider, consulted with Dr. Woodroe Mode; and chart reviewed on 01/17/24.  On evaluation today, the patient is sitting up in bed, and appears to be in no acute distress.  He is calm and cooperative during this assessment. His appearance is appropriate for environment. His eye contact is good.  Speech is clear and coherent, decreased pace and decreased volume. He is alert and oriented x 3 to person, place, time, and situation. He reports his mood is euthymic.  Affect is congruent with mood. Thought process is coherent.  Thought content is within normal  limits. He denies auditory and visual hallucinations.  No indication that he is responding to internal stimuli during this assessment.  No delusions elicited during this assessment. He denies suicidal ideations.  He denies homicidal ideations. Appetite and sleep are fair. Patient able to contract for safety. Patient denies having any access to firearms.  Patient states he is ready to go back home with his family. This provider talked with him about receiving services through [facility] and let  him know that a referral has been made. Patient continues to say he is open to going to [facility] and is in agreement that he needs resources to help him be accountable with his mental health and medications.    Psych ROS:  Depression: Denies Anxiety:  Denies Mania (lifetime and current): Denies Psychosis: (lifetime and current): Lifetime    Collateral information:  Contacted patient mother Sande Brothers and she stated that patient has been declining with his mental health, says he has a hard time keeping a job, because of his mood swings. She agrees that patient would benefit from an ACTT team to help him with accountability and to have people follow up with him. She states that patient father side of family has mental health history. She states she is very supportive of her son, and does not mind him returning home, does not feel he is a danger to self or anyone else. She will be home this weekend to monitor his behavior and help him follow up with the ACTT/ [facility] referral.    ROS   Psychiatric and Social History  Psychiatric History:  Information collected from patient and chart reviewed, patient mother   Prev Dx/Sx: Schizophrenia Current Psych Provider: None Home Meds (current): See above Previous Med Trials: Yes Therapy: None   Prior Psych Hospitalization: Yes Prior Self Harm: Yes Prior Violence: No   Family Psych History: Paternal family Family Hx suicide: Unknown   Social History:  Developmental Hx: Patient appears age appropriate  Educational Hx: Patient graduated high school Occupational Hx: Patient unemployed Legal Hx: Yes, has court case coming up Living Situation: Lives with mother and siblings Spiritual Hx: [employer] Access to weapons/lethal means: Denies   Substance History Alcohol: Denies  Type of alcohol Denies Last Drink Unknown  Number of drinks per day Denies History of alcohol withdrawal seizures Denies History of DT's Denies Tobacco:  Denies Illicit drugs: THC Prescription drug abuse: Denies Rehab hx: Denies  Exam Findings  Physical Exam:  Vital Signs:  Temp:  [97.8 F (36.6 C)-99 F (37.2 C)] 97.8 F (36.6 C) (02/21 0618) Pulse Rate:  [67-70] 69 (02/21 0618) Resp:  [18] 18 (02/21 0618) BP: (123-129)/(81-85) 129/85 (02/21 0618) SpO2:  [100 %] 100 % (02/21 0618) Blood pressure 129/85, pulse 69, temperature 97.8 F (36.6 C), temperature source Oral, resp. rate 18, SpO2 100%. There is no height or weight on file to calculate BMI.  Physical Exam  Mental Status Exam: General Appearance: Casual  Orientation:  Full (Time, Place, and Person)  Memory:  Immediate;   Fair Remote;   Fair  Concentration:  Concentration: Fair and Attention Span: Fair  Recall:  Fair  Attention  Good  Eye Contact:  Good  Speech:  Clear and Coherent and Slow  Language:  Good  Volume:  Decreased  Mood: euthymic  Affect:  Appropriate  Thought Process:  Coherent  Thought Content:  WDL  Suicidal Thoughts:  No  Homicidal Thoughts:  No  Judgement:  Fair  Insight:  Fair  Psychomotor Activity:  Normal  Akathisia:  No  Fund of Knowledge:  Fair      Assets:  Manufacturing systems engineer Desire for Improvement Housing Social Support  Cognition:  WNL  ADL's:  Intact  AIMS (if indicated):        Other History   These have been pulled in through the EMR, reviewed, and updated if appropriate.  Family History:  The patient's family history includes Healthy in his mother.  Medical History: Past Medical History:  Diagnosis Date   ADHD    Depression    Hypertension    Medical history non-contributory     Surgical History: Past Surgical History:  Procedure Laterality Date   NO PAST SURGERIES       Medications:   Current Facility-Administered Medications:    amLODipine (NORVASC) tablet 5 mg, 5 mg, Oral, Daily, Truddie Hidden, Amanda, PA-C, 5 mg at 01/17/24 0943   LORazepam (ATIVAN) tablet 1 mg, 1 mg, Oral, Q8H, Onuoha, Josephine C,  NP, 1 mg at 01/17/24 0618   OLANZapine zydis (ZYPREXA) disintegrating tablet 5 mg, 5 mg, Oral, QHS, Onuoha, Josephine C, NP, 5 mg at 01/16/24 2203   sertraline (ZOLOFT) tablet 50 mg, 50 mg, Oral, Daily, Onuoha, Josephine C, NP, 50 mg at 01/17/24 4782  Current Outpatient Medications:    amLODipine (NORVASC) 5 MG tablet, Take 1 tablet (5 mg total) by mouth daily., Disp: 30 tablet, Rfl: 0   OLANZapine zydis (ZYPREXA) 5 MG disintegrating tablet, Take 1 tablet (5 mg total) by mouth at bedtime., Disp: 30 tablet, Rfl: 0   [START ON 01/18/2024] sertraline (ZOLOFT) 50 MG tablet, Take 1 tablet (50 mg total) by mouth daily., Disp: 30 tablet, Rfl: 0  Allergies: No Known Allergies  Delia Slatten MOTLEY-MANGRUM, PMHNP

## 2024-01-17 NOTE — ED Provider Notes (Addendum)
 Emergency Medicine Observation Re-evaluation Note  Gabriel Nelson is a 23 y.o. male, seen on rounds today.  Pt initially presented to the ED for complaints of Mental Health Problem Currently, the patient is resting.  Physical Exam  BP 129/85 (BP Location: Right Arm)   Pulse 69   Temp 97.8 F (36.6 C) (Oral)   Resp 18   SpO2 100%  Physical Exam General: resting comfortably, NAD Lungs: normal WOB Psych: currently calm and resting  ED Course / MDM  EKG:EKG Interpretation Date/Time:  Tuesday January 14 2024 09:41:42 EST Ventricular Rate:  58 PR Interval:  160 QRS Duration:  84 QT Interval:  412 QTC Calculation: 404 R Axis:   55  Text Interpretation: Sinus bradycardia Nonspecific T wave abnormality Abnormal ECG  Prior t-wave inversions resolved Confirmed by Elayne Snare ([id number]) on 01/15/2024 7:11:36 AM  I have reviewed the labs performed to date as well as medications administered while in observation.  Recent changes in the last 24 hours include none.  Plan  Current plan is for psychiatry to continue to attempt to contact the patient's mother, determining safe disposition.  1135a: Patient has been psychiatrically cleared and we will plan for discharge.  Psychiatry team is offering outpatient resources and support.    Rozelle Logan, DO 01/17/24 1610    Rozelle Logan, DO 01/17/24 1135

## 2024-01-17 NOTE — Discharge Instructions (Addendum)
 Referral sent to [facility] for ACT team services to follow up with patient after discharge, Mrs. Gabriel Nelson please check your email as Gabriel Nelson will be sending follow up information. Thank you.  Gabriel Nelson has an appt on 2/28 at 8:30 via the phone and will receive a call at [phone].  Have a great weekend.  Gabriel Nelson Discharge Coordinator [email or web address] Gabriel Nelson   [email or web address]      Discharge recommendations:  Patient is to take medications as prescribed. Please see information for follow-up appointment with psychiatry and therapy. Please follow up with your primary care provider for all medical related needs.   Therapy: We recommend that patient participate in individual therapy to address mental health concerns.  Medications: The patient or guardian is to contact a medical professional and/or outpatient provider to address any new side effects that develop. The patient or guardian should update outpatient providers of any new medications and/or medication changes.   Atypical antipsychotics: If you are prescribed an atypical antipsychotic, it is recommended that your height, weight, BMI, blood pressure, fasting lipid panel, and fasting blood sugar be monitored by your outpatient providers.  Safety:  The patient should abstain from use of illicit substances/drugs and abuse of any medications. If symptoms worsen or do not continue to improve or if the patient becomes actively suicidal or homicidal then it is recommended that the patient return to the closest hospital emergency department, the [facility], or call 911 for further evaluation and treatment. National Suicide Prevention Lifeline 1-800-SUICIDE or (534)635-0366.  About 988 988 offers 24/7 access to trained crisis counselors who can help people experiencing mental health-related distress. People can call or text 988 or chat 988lifeline.org for themselves or if  they are worried about a loved one who may need crisis support.  Crisis Mobile: [facility]:                     (240)215-0504 (for crisis response 24 hours a day) [facility] Hotline:                                            712-408-8605   Safety Plan Gabriel Nelson will reach out to his mother Gabriel Nelson, call 911 or call mobile crisis, or go to nearest emergency room if condition worsens or if suicidal thoughts become active Patients' will follow up with [facility] or behavioral health urgent care for outpatient psychiatric services (therapy/medication management).  The suicide prevention education provided includes the following: Suicide risk factors Suicide prevention and interventions National Suicide Hotline telephone number [facility] assessment telephone number [facility] Emergency Assistance 911 [facility] and/or Residential Mobile Crisis Unit telephone number Request made of family/significant other to:  mother Gabriel Nelson Remove weapons (e.g., guns, rifles, knives), all items previously/currently identified as safety concern.   Remove drugs/medications (over the counter, prescriptions, illicit drugs), all items previously/currently identified as a safety concern.

## 2024-06-26 ENCOUNTER — Encounter (HOSPITAL_COMMUNITY): Payer: Self-pay | Admitting: Emergency Medicine

## 2024-06-26 ENCOUNTER — Emergency Department (HOSPITAL_COMMUNITY)
Admission: EM | Admit: 2024-06-26 | Discharge: 2024-06-27 | Disposition: A | Discharge status: Other Acute Inpatient Hospital | Payer: MEDICAID | Attending: Emergency Medicine | Admitting: Emergency Medicine

## 2024-06-26 ENCOUNTER — Other Ambulatory Visit: Payer: Self-pay

## 2024-06-26 DIAGNOSIS — F1220 Cannabis dependence, uncomplicated: Secondary | ICD-10-CM | POA: Diagnosis not present

## 2024-06-26 DIAGNOSIS — Z79899 Other long term (current) drug therapy: Secondary | ICD-10-CM | POA: Insufficient documentation

## 2024-06-26 DIAGNOSIS — R4587 Impulsiveness: Secondary | ICD-10-CM | POA: Diagnosis not present

## 2024-06-26 DIAGNOSIS — F332 Major depressive disorder, recurrent severe without psychotic features: Secondary | ICD-10-CM

## 2024-06-26 DIAGNOSIS — I10 Essential (primary) hypertension: Secondary | ICD-10-CM | POA: Insufficient documentation

## 2024-06-26 DIAGNOSIS — F32A Depression, unspecified: Secondary | ICD-10-CM | POA: Insufficient documentation

## 2024-06-26 DIAGNOSIS — F4321 Adjustment disorder with depressed mood: Secondary | ICD-10-CM | POA: Insufficient documentation

## 2024-06-26 DIAGNOSIS — Y900 Blood alcohol level of less than 20 mg/100 ml: Secondary | ICD-10-CM | POA: Insufficient documentation

## 2024-06-26 DIAGNOSIS — F12288 Cannabis dependence with other cannabis-induced disorder: Secondary | ICD-10-CM | POA: Insufficient documentation

## 2024-06-26 LAB — COMPREHENSIVE METABOLIC PANEL WITH GFR
ALT: 23 U/L (ref 0–44)
AST: 23 U/L (ref 15–41)
Albumin: 4.4 g/dL (ref 3.5–5.0)
Alkaline Phosphatase: 58 U/L (ref 38–126)
Anion gap: 12 (ref 5–15)
BUN: 15 mg/dL (ref 6–20)
CO2: 21 mmol/L — ABNORMAL LOW (ref 22–32)
Calcium: 9.5 mg/dL (ref 8.9–10.3)
Chloride: 104 mmol/L (ref 98–111)
Creatinine, Ser: 1.44 mg/dL — ABNORMAL HIGH (ref 0.61–1.24)
GFR, Estimated: >60 mL/min (ref >60)
Glucose, Bld: 123 mg/dL — ABNORMAL HIGH (ref 70–99)
Potassium: 3.6 mmol/L (ref 3.5–5.1)
Sodium: 137 mmol/L (ref 135–145)
Total Bilirubin: 0.9 mg/dL (ref 0.0–1.2)
Total Protein: 8.2 g/dL — ABNORMAL HIGH (ref 6.5–8.1)

## 2024-06-26 LAB — RAPID URINE DRUG SCREEN, HOSP PERFORMED
Amphetamines: NOT DETECTED
Barbiturates: NOT DETECTED
Benzodiazepines: NOT DETECTED
Cocaine: NOT DETECTED
Opiates: NOT DETECTED
Tetrahydrocannabinol: POSITIVE — AB

## 2024-06-26 LAB — ETHANOL: Alcohol, Ethyl (B): <15 mg/dL

## 2024-06-26 LAB — CBC
HCT: 47.5 % (ref 39.0–52.0)
Hemoglobin: 15.2 g/dL (ref 13.0–17.0)
MCH: 28 pg (ref 26.0–34.0)
MCHC: 32 g/dL (ref 30.0–36.0)
MCV: 87.5 fL (ref 80.0–100.0)
Platelets: 225 10*3/uL (ref 150–400)
RBC: 5.43 MIL/uL (ref 4.22–5.81)
RDW: 12.5 % (ref 11.5–15.5)
WBC: 8.8 10*3/uL (ref 4.0–10.5)
nRBC: 0 % (ref 0.0–0.2)

## 2024-06-26 LAB — CBG MONITORING, ED: Glucose-Capillary: 111 mg/dL — ABNORMAL HIGH (ref 70–99)

## 2024-06-26 NOTE — ED Provider Notes (Signed)
 [place] EMERGENCY DEPARTMENT AT [facility] Provider Note   CSN: [id number] Arrival date & time: 06/26/24  2125     Patient presents with: Depression   Gabriel Nelson is a 23 y.o. male.   The history is provided by the patient and medical records.  Depression   23 year old male with adjustment disorder, substance abuse, presenting to the ED with worsening depression.  Family reports he has seemed more down than usual, he is coming up on the anniversary of his ex-girlfriend's death.  Patient admits this has been hard for him.  He states he has been very sad but does not give much further detail than this.  Family expressed concerns that patient might hurt himself, however has not expressed any plan to do so.  He tells me he has been taking medications but family reports he has not.    Prior to Admission medications   Medication Sig Start Date End Date Taking? Authorizing Provider  amLODipine  (NORVASC ) 5 MG tablet Take 1 tablet (5 mg total) by mouth daily. 01/17/24 02/16/24  Motley-Mangrum, Jadeka A, PMHNP  OLANZapine  zydis (ZYPREXA ) 5 MG disintegrating tablet Take 1 tablet (5 mg total) by mouth at bedtime. 01/17/24 02/16/24  Motley-Mangrum, Jadeka A, PMHNP  sertraline  (ZOLOFT ) 50 MG tablet Take 1 tablet (50 mg total) by mouth daily. 01/18/24   Motley-Mangrum, Jadeka A, PMHNP    Allergies: Patient has no known allergies.    Review of Systems  Psychiatric/Behavioral:  Positive for depression.        Depression  All other systems reviewed and are negative.   Updated Vital Signs BP (!) 151/95 (BP Location: Right Arm)   Pulse (!) 127   Temp 99.1 F (37.3 C) (Oral)   Resp 20   Ht 5' 10 (1.778 m)   Wt 105 kg   SpO2 95%   BMI 33.21 kg/m   Physical Exam Vitals and nursing note reviewed.  Constitutional:      Appearance: He is well-developed.  HENT:     Head: Normocephalic and atraumatic.  Eyes:     Conjunctiva/sclera: Conjunctivae normal.     Pupils: Pupils are  equal, round, and reactive to light.  Cardiovascular:     Rate and Rhythm: Normal rate and regular rhythm.     Heart sounds: Normal heart sounds.  Pulmonary:     Effort: Pulmonary effort is normal. No respiratory distress.     Breath sounds: Normal breath sounds. No rhonchi.  Abdominal:     General: Bowel sounds are normal.     Palpations: Abdomen is soft.  Musculoskeletal:        General: Normal range of motion.     Cervical back: Normal range of motion.  Skin:    General: Skin is warm and dry.  Neurological:     Mental Status: He is alert and oriented to person, place, and time.  Psychiatric:     Comments: Seems withdrawn, not answering a lot of questions when asked, giving limited details into recent behavior and what prompted ER visit     (all labs ordered are listed, but only abnormal results are displayed) Labs Reviewed  COMPREHENSIVE METABOLIC PANEL WITH GFR - Abnormal; Notable for the following components:      Result Value   CO2 21 (*)    Glucose, Bld 123 (*)    Creatinine, Ser 1.44 (*)    Total Protein 8.2 (*)    All other components within normal limits  RAPID URINE DRUG SCREEN,  HOSP PERFORMED - Abnormal; Notable for the following components:   Tetrahydrocannabinol POSITIVE (*)    All other components within normal limits  CBG MONITORING, ED - Abnormal; Notable for the following components:   Glucose-Capillary 111 (*)    All other components within normal limits  ETHANOL  CBC    EKG: None  Radiology: No results found.   Procedures   Medications Ordered in the ED - No data to display                                  Medical Decision Making Amount and/or Complexity of Data Reviewed Labs: ordered. ECG/medicine tests: ordered and independent interpretation performed.   23 year old male presenting to the ED with worsening depression.  Coming up on anniversary of ex-girlfriend's death.  He does admit to some increased sadness but is not providing any  further details at this time.  Family was concern for self-harm and reports he is not taking his medications.  Labs obtained here--no leukocytosis or profound anemia.  Creatinine is 1.44, similar to prior.  UDS is positive for THC.  Ethanol is negative.  Medically cleared.  Will get TTS evaluation.  CTS has evaluated, recommends overnight observation and reassessment by psychiatry in the morning.  Final diagnoses:  Depression, unspecified depression type    ED Discharge Orders     None          [name], PA-C 06/27/24 0503    [name], MD 06/28/24 [phone]

## 2024-06-26 NOTE — ED Triage Notes (Signed)
 Patient BIB EMS from home c/o depression. Per report patient more depressed than usual d/t patient ex girl friend death anniversary. Per report family is just worried that patient might hurt himself. Per report patient not taking his medication. Patient denies SI/HI.

## 2024-06-26 NOTE — BH Assessment (Signed)
 Comprehensive Clinical Assessment (CCA) Note  06/27/2024 Gabriel Nelson [id number]  Chief Complaint:  Chief Complaint  Patient presents with   Depression  Disposition: Per Roxianne Olp, NP patient is recommended for overnight observation with re-evaluation in the morning.  The patient demonstrates the following risk factors for suicide: Chronic risk factors for suicide include: psychiatric disorder of adjustment disorder, ADHD, PTSD per his report and substance use disorder. Acute risk factors for suicide include: loss (financial, interpersonal, professional). Protective factors for this patient include: hope for the future. Considering these factors, the overall suicide risk at this point appears to be low. Patient is appropriate for outpatient follow up.   Patient is a 23 year old male with a history of adjustment disorder, cannabis use disorder, cannabis induced mood disorder, psychosis, ADHD and PTSD per his report. He presents voluntarily to [facility] for an assessment. Patient resides in the home with his mother, brother and sister. Patient reports feeling overwhelmed due to ongoing grief from his girlfriends death 3 years ago. He reports today is the anniversary of her death and this time of year is hard for him. He also reports stress due to having difficulty finding employment. Patient reports isolation, fatigue and sadness. Patient has a hx of Substance Abuse:Marijuana and alcohol. Last use was today an 8th of marijuana and 2-3 shots of alcohol. Patient reports daily use of marijuana 3x a day and weekly use of alcohol, about once a week. .Patient denies NSSIB, SI, HI, and AVH.  Patient reports a previous inpatient admission for mental health about 1-2 years ago but did not elaborate further. Patient reports he was previously seeing a psychiatrist Dr. Cloria but it is unclear when he last saw this provider. Patient  first said he has not seen his psychiatrist in 5-6 years, then states he see's  him regularly via telehealth, his recollection is inconsistent. Patient also reports he was seeing a therapist 3-4 years ago but he is no longer established. Patient states he was previously prescribed Zoloft  and Respiradol, but states he has not taken any medication in about 1-2 weeks. When he was asked if he still has these prescriptions he stated yes he does, but when asked why he stopped taking them he stated he was out of the medication. Patient is a poor historian. Patient states the main reason he came to the hospital was to get some rest. Patient appears to be confused at times during the assessment and appears to be still feeling affects of whatever substances he used prior to his arrival. Patient is smiling and smirking inappropriately during the assessment. Patient denies history of abuse or trauma. Patient denies current legal problems.  Patient denies access to weapons.Patient is able to to contract for safety outside of the hospital.      Visit Diagnosis:  Adjustment disorder with mixed anxiety and depressed mood   CCA Screening, Triage and Referral (STR)  Patient Reported Information How did you hear about us ? Self  What Is the Reason for Your Visit/Call Today? Patient is a 23 year old male with a history of  adjustment disorder, cannabis use disorder, cannabis induced mood disorder, psychosis, ADHD and PTSD per his report.  He presents voluntarily to [facility] for an assessment. Patient resides in the home with his mother, brother and sister. Patient reports feeling overwhelmed due to ongoing grief from his girlfriends death 3 years ago. He reports today is the anniversary of her death and this time of year is hard for him. He also  reports stress due to having difficulty finding employment. Patient reports isolation, fatigue and sadness. Patient has a hx of Substance Abuse:Marijuana and alcohol. Last use was today an 8th of marijuana and 2-3 shots of alcohol. Patient reports daily use of  marijuana 3x a day and weekly use of alcohol, about once a week. .Patient denies NSSIB, SI, HI, and AVH.  How Long Has This Been Causing You Problems? <Week  What Do You Feel Would Help You the Most Today? Treatment for Depression or other mood problem; Medication(s)   Have You Recently Had Any Thoughts About Hurting Yourself? No  Are You Planning to Commit Suicide/Harm Yourself At This time? No   Flowsheet Row ED from 06/26/2024 in [facility] Emergency Department at [facility] from 01/13/2024 in [facility] Emergency Department at [facility] ED from 12/13/2022 in [facility] Emergency Department at [facility]  Nelson-SSRS RISK CATEGORY No Risk No Risk No Risk    Have you Recently Had Thoughts About Hurting Someone [place]? No  Are You Planning to Harm Someone at This Time? No  Explanation: n/a   Have You Used Any Alcohol or Drugs in the Past 24 Hours? Yes  How Long Ago Did You Use Drugs or Alcohol? Today What Did You Use and How Much? alcohol and marijuana   Do You Currently Have a Therapist/Psychiatrist? No  Name of Therapist/Psychiatrist:    Have You Been Recently Discharged From Any Office Practice or Programs? No  Explanation of Discharge From Practice/Program: n/a    CCA Screening Triage Referral Assessment Type of Contact: Tele-Assessment  Telemedicine Service Delivery: Telemedicine service delivery: This service was provided via telemedicine using a 2-way, interactive audio and video technology  Is this Initial or Reassessment? Is this Initial or Reassessment?: Initial Assessment  Date Telepsych consult ordered in [facility]:  Date Telepsych consult ordered in [facility]: 06/26/24  Time Telepsych consult ordered in [facility]:  Time Telepsych consult ordered in [facility]: 2257  Location of Assessment: [facility] ED  Provider Location: [facility] Assessment Services   Collateral Involvement: n/a   Does Patient Have a Automotive engineer Guardian? No  Legal Guardian  Contact Information: n/a  Copy of Legal Guardianship Form: -- (n/a)  Legal Guardian Notified of Arrival: -- (n/a)  Legal Guardian Notified of Pending Discharge: -- (n/a)  If Minor and Not Living with Parent(s), Who has Custody? n/a  Is CPS involved or ever been involved? Never  Is APS involved or ever been involved? Never   Patient Determined To Be At Risk for Harm To Self or Others Based on Review of Patient Reported Information or Presenting Complaint? No  Method: No Plan  Availability of Means: No access or NA  Intent: Vague intent or NA  Notification Required: No need or identified person  Additional Information for Danger to Others Potential: -- (n/a)  Additional Comments for Danger to Others Potential: None noted  Are There Guns or Other Weapons in Your Home? No  Types of Guns/Weapons: None  Are These Weapons Safely Secured?                            -- (n/a)  Who Could Verify You Are Able To Have These Secured: No gun reported  Do You Have any Outstanding Charges, Pending Court Dates, Parole/Probation? denies  Contacted To Inform of Risk of Harm To Self or Others: Other: Comment (n/a)    Does Patient Present under  Involuntary Commitment? No    [place] of Residence: [place]   Patient Currently Receiving the Following Services: Not Receiving Services   Determination of Need: Urgent (48 hours)   Options For Referral: Medication Management; Outpatient Therapy; Intensive Outpatient Therapy     CCA Biopsychosocial Patient Reported Schizophrenia/Schizoaffective Diagnosis in Past: No   Strengths: Patient has family support   Mental Health Symptoms Depression:  Fatigue   Duration of Depressive symptoms: Duration of Depressive Symptoms: N/A   Mania:  N/A   Anxiety:   Worrying; Tension; Fatigue   Psychosis:  None   Duration of Psychotic symptoms:    Trauma:  Avoids reminders of event (girlfriends death)   Obsessions:  N/A   Compulsions:   N/A   Inattention:  N/A   Hyperactivity/Impulsivity:  N/A   Oppositional/Defiant Behaviors:  N/A   Emotional Irregularity:  N/A   Other Mood/Personality Symptoms:  None noted    Mental Status Exam Appearance and self-care  Stature:  Average   Weight:  Overweight   Clothing:  -- (In scrubs)   Grooming:  Normal   Cosmetic use:  None   Posture/gait:  Other (Comment) (lying in hospital bed)   Motor activity:  Not Remarkable   Sensorium  Attention:  Normal   Concentration:  Normal   Orientation:  Person; Place; Situation   Recall/memory:  Defective in Remote   Affect and Mood  Affect:  Flat   Mood:  Anxious   Relating  Eye contact:  Normal   Facial expression:  Depressed   Attitude toward examiner:  Cooperative   Thought and Language  Speech flow: Normal   Thought content:  Appropriate to Mood and Circumstances   Preoccupation:  None   Hallucinations:  None   Organization:  Loose   Company secretary of Knowledge:  Fair   Intelligence:  Average   Abstraction:  Normal   Judgement:  Fair   Reality Testing:  Adequate   Insight:  Lacking; Fair   Decision Making:  Impulsive   Social Functioning  Social Maturity:  Isolates; Impulsive   Social Judgement:  Normal   Stress  Stressors:  Grief/losses; Work   Coping Ability:  Human resources officer Deficits:  Self-care; Responsibility; Decision making   Supports:  Friends/Service system; Family; Support needed     Religion: Religion/Spirituality Are You A Religious Person?: Yes What is Your Religious Affiliation?: Christian How Might This Affect Treatment?: n/a  Leisure/Recreation: Leisure / Recreation Do You Have Hobbies?: Yes Leisure and Hobbies: music, exercise  Exercise/Diet: Exercise/Diet Do You Exercise?: Yes What Type of Exercise Do You Do?: Weight Training, Run/Walk How Many Times a Week Do You Exercise?: 1-3 times a week Have You Gained or Lost A Significant  Amount of Weight in the Past Six Months?: No Do You Follow a Special Diet?: No Do You Have Any Trouble Sleeping?: No   CCA Employment/Education Employment/Work Situation: Employment / Work Situation Employment Situation: Unemployed Patient's Job has Been Impacted by Current Illness: No Has Patient ever Been in Equities trader?: No  Education: Education Is Patient Currently Attending School?: No Last Grade Completed: 12 Did You Product manager?: No Did You Have An Individualized Education Program (IIEP): No Did You Have Any Difficulty At [employer]?: No Patient's Education Has Been Impacted by Current Illness: No   CCA Family/Childhood History Family and Relationship History: Family history Marital status: Single Does patient have children?: Yes How many children?: 1 How is patient's relationship with their children?: 1 year  old son  Childhood History:  Childhood History By whom was/is the patient raised?: Mother, Father Did patient suffer any verbal/emotional/physical/sexual abuse as a child?: No Did patient suffer from severe childhood neglect?: No Has patient ever been sexually abused/assaulted/raped as an adolescent or adult?: No Was the patient ever a victim of a crime or a disaster?: No Witnessed domestic violence?: No Has patient been affected by domestic violence as an adult?: No       CCA Substance Use Alcohol/Drug Use:                           ASAM's:  Six Dimensions of Multidimensional Assessment  Dimension 1:  Acute Intoxication and/or Withdrawal Potential:      Dimension 2:  Biomedical Conditions and Complications:      Dimension 3:  Emotional, Behavioral, or Cognitive Conditions and Complications:     Dimension 4:  Readiness to Change:     Dimension 5:  Relapse, Continued use, or Continued Problem Potential:     Dimension 6:  Recovery/Living Environment:     ASAM Severity Score:    ASAM Recommended Level of Treatment:     Substance use  Disorder (SUD)    Recommendations for Services/Supports/Treatments:    Disposition Recommendation per psychiatric provider: Per Roxianne Olp, NP patient is recommended for overnight observation with re-evaluation in the morning.   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

## 2024-06-26 NOTE — BH Assessment (Incomplete)
 Comprehensive Clinical Assessment (CCA) Note  06/26/2024 Gabriel Nelson [id number]  Chief Complaint:  Chief Complaint  Patient presents with  . Depression  Disposition: Per *** patient is recommended for inpatient admission/discharge with outpatient follow-up/ overnight observation with re-evaluation in the morning.  Disposition SW to pursue appropriate inpatient options.  The patient demonstrates the following risk factors for suicide: Chronic risk factors for suicide include: {Chronic Risk Factors for Dlprpiz:69585988}. Acute risk factors for suicide include: {Acute Risk Factors for Dlprpiz:69585987}. Protective factors for this patient include: {Protective Factors for Suicide Mpdx:69585986}. Considering these factors, the overall suicide risk at this point appears to be {Desc; low/moderate/high:110033}. Patient {ACTION; IS/IS WNU:78978602} appropriate for outpatient follow up.   Patient is a *** year old {Desc; male/male:11659} with a history of {Diagnoses; anxiety/depression:15361} who presents {Voluntary?:60373} to [facility] for an assessment. Patient resides in the home with *** and identifies*** as their primary support system.Patient reports isolation, crying spells, irritability, hopelessness, guilt, loss of interest to do things they enjoy, fatigue, lack of concentration, worthlessness, change in sleep, change in appetite. Patient reports history of past suicide attempts, last occurrence was ***.  Patient has a hx of Substance Abuse:  Last use was *** .Patient {Actions; denies-reports:120008} NSSIB, SI, HI, AVH.  Patient identifies {his/her/their:21314} primary stressors as ***. Patient reports a family hx of ***{Diagnoses; anxiety/depression:15361}. Patient {Actions; denies-reports:120008} history of abuse or trauma. Patient {Actions; denies-reports:120008} current legal problems. Patient {ACTION; IS/IS WNU:78978602} receiving outpatient therapy and psychiatry services,  with ***. Patient reports {he/she/they:23295} takes {his/her/their:21314} medications as prescribed (see MAR) and {Actions; denies-reports:120008} recent medication changes. Patient {Actions; denies-reports:120008} previous inpatient admission at ***for *** in ***.  Patient {Actions; denies-reports:120008} access to weapons.   Patient is {ACTIONS; CAN/NOT:18746} to contract for safety outside of the hospital.  Patient gives verbal consent for [facility] to contact ***.  Per {FAMILY FZFAZMD:80002} patient is ***.   Treatment options were discussed and patient is in agreement with recommendation for ***.       Visit Diagnosis: ***    CCA Screening, Triage and Referral (STR)  Patient Reported Information How did you hear about us ? Family/Friend (Family memers brought patient to [facility].)  What Is the Reason for Your Visit/Call Today? PA Alan Harari documented is a 23 y.o. male brought in by family members from home.  They report patient has not been talking like normal and this is typically his first step in a mental breakdown.  Family report he may have a history of schizophrenia and is not currently taking medications.  When I attempt to talk to patient, he is not answering any of  my questions.  She went on to documented that I spoke to patient's mom, she reports concern for psychotic episodes.  States patient has been very quiet over the last couple of days.  She says patient has not been exhibiting any SI or HI.  When I asked patient is, he does not endorse any SI or HI either.  No indication for IVC at this time.  He is here voluntarily.  When this clinician saw patient, patien tdid not respond to any questions at all.  He stared and did not make any vocalizations.  How Long Has This Been Causing You Problems? <Week  What Do You Feel Would Help You the Most Today? Treatment for Depression or other mood problem   Have You Recently Had Any Thoughts About Hurting Yourself? -- (Pt does  not respond.)  Are You Planning to Commit Suicide/Harm  Yourself At This time? -- (Pt does not respond.)   Flowsheet Row ED from 06/26/2024 in [facility] Emergency Department at [facility] ED from 01/13/2024 in [facility] Emergency Department at [facility] ED from 12/13/2022 in [facility] Emergency Department at [facility]  C-SSRS RISK CATEGORY No Risk No Risk No Risk    Have you Recently Had Thoughts About Hurting Someone [place]? -- (Pt does not respond)  Are You Planning to Harm Someone at This Time? -- (Pt does not respond.)  Explanation: Pt does not respond to question.   Have You Used Any Alcohol or Drugs in the Past 24 Hours? -- (Pt does not respond to question.)  How Long Ago Did You Use Drugs or Alcohol? No data recorded What Did You Use and How Much? No data recorded  Do You Currently Have a Therapist/Psychiatrist? -- (Pt does not respond to question.)  Name of Therapist/Psychiatrist:    Have You Been Recently Discharged From Any Office Practice or Programs? -- (Pt does not respond to question.)  Explanation of Discharge From Practice/Program: No data recorded    CCA Screening Triage Referral Assessment Type of Contact: Tele-Assessment  Telemedicine Service Delivery:   Is this Initial or Reassessment?   Date Telepsych consult ordered in [facility]:    Time Telepsych consult ordered in [facility]:    Location of Assessment: [facility] ED  Provider Location: [facility] Assessment Services   Collateral Involvement: PA Alan Harari contacted pt's mother Gretchen Garfinkel [phone]   Does Patient Have a Court Appointed Legal Guardian? No  Legal Guardian Contact Information: Pt has no legal guardian  Copy of Legal Guardianship Form: -- (Pt has no legal guardian)  Legal Guardian Notified of Arrival: -- (Pt has no legal guardian)  Legal Guardian Notified of Pending Discharge: -- (Pt has no legal guardian)  If Minor and Not Living with Parent(s), Who has  Custody? Pt is an adult  Is CPS involved or ever been involved? Never  Is APS involved or ever been involved? Never   Patient Determined To Be At Risk for Harm To Self or Others Based on Review of Patient Reported Information or Presenting Complaint? -- (Pt has voiced no SI or HI.)  Method: -- (Pt has voiced no SI or HI.)  Availability of Means: -- (Pt has voiced no SI or HI.)  Intent: -- (Pt has voiced no SI or HI.)  Notification Required: -- (Pt has voiced no SI or HI.)  Additional Information for Danger to Others Potential: -- (Pt has voiced no SI or HI.)  Additional Comments for Danger to Others Potential: None noted  Are There Guns or Other Weapons in Your Home? No  Types of Guns/Weapons: None  Are These Weapons Safely Secured?                            No  Who Could Verify You Are Able To Have These Secured: No gun reported  Do You Have any Outstanding Charges, Pending Court Dates, Parole/Probation? Pt as on probation as of last January '24.  Contacted To Inform of Risk of Harm To Self or Others: -- (Pt did not respond to question.)    Does Patient Present under Involuntary Commitment? No    [place] of Residence: [place]   Patient Currently Receiving the Following Services: Not Receiving Services (Unknown)   Determination of Need: Urgent (48 hours)   Options For Referral: Inpatient Hospitalization  CCA Biopsychosocial Patient Reported Schizophrenia/Schizoaffective Diagnosis in Past: No   Strengths: Patient has family support   Mental Health Symptoms Depression:  -- (Pt did not respond to question.)   Duration of Depressive symptoms:    Mania:  -- (Pt did not respond to question.)   Anxiety:   -- (Pt did not respond to question.)   Psychosis:  -- (Pt did not respond to question.)   Duration of Psychotic symptoms:    Trauma:  -- (Pt did not respond to question.)   Obsessions:  -- (Pt did not respond to question.)   Compulsions:  --  (Pt did not respond to question.)   Inattention:  -- (Pt did not respond to question.)   Hyperactivity/Impulsivity:  -- (Pt did not respond to question.)   Oppositional/Defiant Behaviors:  -- (Pt did not respond to question.)   Emotional Irregularity:  Mood lability   Other Mood/Personality Symptoms:  None noted    Mental Status Exam Appearance and self-care  Stature:  Average   Weight:  Average weight   Clothing:  -- (In scrubs)   Grooming:  Normal   Cosmetic use:  None   Posture/gait:  Normal   Motor activity:  Not Remarkable   Sensorium  Attention:  Inattentive   Concentration:  Preoccupied   Orientation:  -- (Pt did not respond to question.)   Recall/memory:  Normal   Affect and Mood  Affect:  Blunted; Flat   Mood:  Dysphoric   Relating  Eye contact:  Staring   Facial expression:  Depressed   Attitude toward examiner:  Dependent; Passive; Uninterested   Thought and Language  Speech flow: -- (Pt did nto speak.)   Thought content:  -- (Pt did not respond to question.)   Preoccupation:  None   Hallucinations:  None (Pt did not respond to question.)   Organization:  Other (Comment) (Pt did not respond to question.)   [employer] of Knowledge:  Fair   Intelligence:  Average   Abstraction:  Normal   Judgement:  -- (Pt did not respond to question.)   Reality Testing:  -- (Pt did not respond to question.)   Insight:  Unaware; None/zero insight   Decision Making:  -- (Pt did not respond to question.)   Social Functioning  Social Maturity:  -- (Pt did not respond to question.)   Social Judgement:  -- (Pt did not respond to question.)   Stress  Stressors:  -- (Pt did not respond to question.)   Coping Ability:  Overwhelmed   Skill Deficits:  Self-care; Responsibility; Decision making (Pt did not respond to question.)   Supports:  Friends/Service system; Family     Religion:    Leisure/Recreation:     Exercise/Diet:     CCA Employment/Education Employment/Work Situation:    Education:     CCA Family/Childhood History Family and Relationship History:    Childhood History:          CCA Substance Use Alcohol/Drug Use:                           ASAM's:  Six Dimensions of Multidimensional Assessment  Dimension 1:  Acute Intoxication and/or Withdrawal Potential:      Dimension 2:  Biomedical Conditions and Complications:      Dimension 3:  Emotional, Behavioral, or Cognitive Conditions and Complications:     Dimension 4:  Readiness to Change:     Dimension 5:  Relapse, Continued use, or Continued Problem Potential:     Dimension 6:  Recovery/Living Environment:     ASAM Severity Score:    ASAM Recommended Level of Treatment:     Substance use Disorder (SUD)    Recommendations for Services/Supports/Treatments:    Disposition Recommendation per psychiatric provider: {CHLmaccldispo:31820}   DSM5 Diagnoses: Patient Active Problem List   Diagnosis Date Noted  . Adjustment disorder with mixed anxiety and depressed mood 12/07/2022  . Involuntary commitment   . 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 Psychosis (HCC)   . Moderate cannabis use disorder (HCC) 08/08/2018  . 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 Rhabdomyolysis 10/27/2016  . Marijuana intoxication (HCC) 10/27/2016  . Osteomyelitis (HCC) 10/23/2015     Referrals to Alternative Service(s): Referred to Alternative Service(s):   Place:   Date:   Time:    Referred to Alternative Service(s):   Place:   Date:   Time:    Referred to Alternative Service(s):   Place:   Date:   Time:    Referred to Alternative Service(s):   Place:   Date:   Time:     Andreu Drudge C Jhalil Silvera, LCMHCA

## 2024-06-26 NOTE — ED Notes (Signed)
 2 belongings bags placed in cabinet for 23-25, [address]. Pt was wanded by security.

## 2024-06-27 ENCOUNTER — Encounter (HOSPITAL_COMMUNITY): Payer: Self-pay | Admitting: Adult Health

## 2024-06-27 ENCOUNTER — Inpatient Hospital Stay (HOSPITAL_COMMUNITY)
Admission: AD | Admit: 2024-06-27 | Discharge: 2024-07-01 | DRG: 885 | Disposition: A | Discharge status: Home / Self Care | Payer: MEDICAID | Source: Intra-hospital | Attending: Psychiatry | Admitting: Psychiatry

## 2024-06-27 DIAGNOSIS — F4322 Adjustment disorder with anxiety: Secondary | ICD-10-CM | POA: Diagnosis present

## 2024-06-27 DIAGNOSIS — F32A Depression, unspecified: Secondary | ICD-10-CM | POA: Insufficient documentation

## 2024-06-27 DIAGNOSIS — Z79899 Other long term (current) drug therapy: Secondary | ICD-10-CM | POA: Diagnosis not present

## 2024-06-27 DIAGNOSIS — I10 Essential (primary) hypertension: Secondary | ICD-10-CM | POA: Diagnosis present

## 2024-06-27 DIAGNOSIS — F4310 Post-traumatic stress disorder, unspecified: Secondary | ICD-10-CM | POA: Diagnosis present

## 2024-06-27 DIAGNOSIS — Z91148 Patient's other noncompliance with medication regimen for other reason: Secondary | ICD-10-CM

## 2024-06-27 DIAGNOSIS — F4321 Adjustment disorder with depressed mood: Principal | ICD-10-CM | POA: Diagnosis present

## 2024-06-27 DIAGNOSIS — F12288 Cannabis dependence with other cannabis-induced disorder: Secondary | ICD-10-CM | POA: Diagnosis not present

## 2024-06-27 DIAGNOSIS — F17210 Nicotine dependence, cigarettes, uncomplicated: Secondary | ICD-10-CM | POA: Diagnosis present

## 2024-06-27 DIAGNOSIS — F1220 Cannabis dependence, uncomplicated: Secondary | ICD-10-CM | POA: Diagnosis present

## 2024-06-27 DIAGNOSIS — F17290 Nicotine dependence, other tobacco product, uncomplicated: Secondary | ICD-10-CM | POA: Diagnosis present

## 2024-06-27 DIAGNOSIS — F909 Attention-deficit hyperactivity disorder, unspecified type: Secondary | ICD-10-CM | POA: Diagnosis present

## 2024-06-27 DIAGNOSIS — F39 Unspecified mood [affective] disorder: Secondary | ICD-10-CM | POA: Diagnosis present

## 2024-06-27 DIAGNOSIS — G479 Sleep disorder, unspecified: Secondary | ICD-10-CM | POA: Diagnosis present

## 2024-06-27 DIAGNOSIS — F332 Major depressive disorder, recurrent severe without psychotic features: Secondary | ICD-10-CM | POA: Diagnosis present

## 2024-06-27 HISTORY — DX: Unspecified psychosis not due to a substance or known physiological condition: F29

## 2024-06-27 HISTORY — DX: Encounter for general psychiatric examination, requested by authority: Z04.6

## 2024-06-27 MED ORDER — HYDROXYZINE HCL 25 MG PO TABS
25.0000 mg | ORAL_TABLET | Freq: Three times a day (TID) | ORAL | Status: DC | PRN
  Administered 2024-06-28: 25 mg via ORAL
  Filled 2024-06-27 (×2): qty 1

## 2024-06-27 MED ORDER — SERTRALINE HCL 50 MG PO TABS
50.0000 mg | ORAL_TABLET | Freq: Every day | ORAL | Status: DC
  Administered 2024-06-27: 50 mg via ORAL
  Filled 2024-06-27: qty 1

## 2024-06-27 MED ORDER — LORAZEPAM 2 MG/ML IJ SOLN: 2.0000 mg | Freq: Three times a day (TID) | INTRAMUSCULAR | Status: DC | PRN

## 2024-06-27 MED ORDER — HYDROXYZINE HCL 25 MG PO TABS
25.0000 mg | ORAL_TABLET | Freq: Three times a day (TID) | ORAL | Status: DC | PRN
  Administered 2024-06-27: 25 mg via ORAL
  Filled 2024-06-27: qty 1

## 2024-06-27 MED ORDER — HALOPERIDOL LACTATE 5 MG/ML IJ SOLN: 5.0000 mg | Freq: Three times a day (TID) | INTRAMUSCULAR | Status: DC | PRN

## 2024-06-27 MED ORDER — ALUM & MAG HYDROXIDE-SIMETH 200-200-20 MG/5ML PO SUSP: 30.0000 mL | ORAL | Status: DC | PRN

## 2024-06-27 MED ORDER — SERTRALINE HCL 50 MG PO TABS
50.0000 mg | ORAL_TABLET | Freq: Every day | ORAL | Status: DC
  Administered 2024-06-28: 50 mg via ORAL
  Filled 2024-06-27: qty 1

## 2024-06-27 MED ORDER — DIPHENHYDRAMINE HCL 50 MG/ML IJ SOLN: 50.0000 mg | Freq: Three times a day (TID) | INTRAMUSCULAR | Status: DC | PRN

## 2024-06-27 MED ORDER — MAGNESIUM HYDROXIDE 400 MG/5ML PO SUSP: 30.0000 mL | Freq: Every day | ORAL | Status: DC | PRN

## 2024-06-27 MED ORDER — AMLODIPINE BESYLATE 5 MG PO TABS
5.0000 mg | ORAL_TABLET | Freq: Every day | ORAL | Status: DC
  Administered 2024-06-28 – 2024-07-01 (×4): 5 mg via ORAL
  Filled 2024-06-27 (×4): qty 1

## 2024-06-27 MED ORDER — ACETAMINOPHEN 325 MG PO TABS: 650.0000 mg | ORAL_TABLET | Freq: Four times a day (QID) | ORAL | Status: DC | PRN

## 2024-06-27 MED ORDER — AMLODIPINE BESYLATE 5 MG PO TABS
5.0000 mg | ORAL_TABLET | Freq: Every day | ORAL | Status: DC
  Administered 2024-06-27: 5 mg via ORAL
  Filled 2024-06-27: qty 1

## 2024-06-27 MED ORDER — HALOPERIDOL 5 MG PO TABS: 5.0000 mg | ORAL_TABLET | Freq: Three times a day (TID) | ORAL | Status: DC | PRN

## 2024-06-27 MED ORDER — DIPHENHYDRAMINE HCL 25 MG PO CAPS: 50.0000 mg | ORAL_CAPSULE | Freq: Three times a day (TID) | ORAL | Status: DC | PRN

## 2024-06-27 MED ORDER — HALOPERIDOL LACTATE 5 MG/ML IJ SOLN: 10.0000 mg | Freq: Three times a day (TID) | INTRAMUSCULAR | Status: DC | PRN

## 2024-06-27 NOTE — Plan of Care (Signed)
   Problem: Education: Goal: Knowledge of [place] [facility] Education information/materials will improve Outcome: Progressing Goal: Verbalization of understanding the information provided will improve Outcome: Progressing

## 2024-06-27 NOTE — Progress Notes (Addendum)
 Patient is a 23 year old male who presented to [facility] from [facility] under voluntary admission due to worsening depression. Pt has a psych hx of adjustment disorder with depressed mood, depression and cannabis induced mood disorder. Per report, pt's family has been concerned for pt's safety due to pt's worsening depression with the upcoming anniversary of his ex girlfriend's death. Pt presented polite, pleasant, but was somewhat guarded, minimizing his symptoms, and was vague about why his family was concerned about him. Pt did report that when he gets overwhelmed he zones out, and would like help getting his emotions under control. Pt reported smoking weed daily, and stated that it helps with his sleep. Pt denied using other illicit substances and reported that he rarely drinks alcohol  Pt denied SI/HI and A/VH, and doesn't appear to be responding to internal stimuli. Admission paperwork completed and signed. Verbal understanding expressed.  VS monitored and recorded. Skin check performed with MHT. Belongings searched and secured in locker.   Patient was oriented to unit and schedule. Q 15 min checks initiated for safety.

## 2024-06-27 NOTE — ED Provider Notes (Signed)
  Physical Exam  BP (!) 151/95 (BP Location: Right Arm)   Pulse (!) 127   Temp 99.1 F (37.3 C) (Oral)   Resp 20   Ht 5' 10 (1.778 m)   Wt 105 kg   SpO2 95%   BMI 33.21 kg/m   Physical Exam  Procedures  Procedures  ED Course / MDM    Medical Decision Making Amount and/or Complexity of Data Reviewed Labs: ordered.   Seen by psychiatry last night.  Recommended reevaluation this morning.       Patsey Lot, MD 06/27/24 [phone]

## 2024-06-27 NOTE — Consult Note (Signed)
 [facility] Psychiatric Consult Follow-up  Patient Name: .Gabriel Nelson  MRN: [id number]  DOB: 26-Jan-2001  Consult Order details:  Orders (From admission, onward)     Start     Ordered   06/26/24 2257  CONSULT TO CALL ACT TEAM       Ordering Provider: [name], PA-C  Provider:  (Not yet assigned)  Question:  Reason for Consult?  Answer:  Psych consult   06/26/24 2256             Mode of Visit: Tele-visit Virtual Statement:TELE PSYCHIATRY ATTESTATION & CONSENT As the provider for this telehealth consult, I attest that I verified the patient's identity using two separate identifiers, introduced myself to the patient, provided my credentials, disclosed my location, and performed this encounter via a HIPAA-compliant, real-time, face-to-face, two-way, interactive audio and video platform and with the full consent and agreement of the patient (or guardian as applicable.) Patient physical location: [facility]. Telehealth provider physical location: home office in state of [place].   Video start time: 1036 Video end time: 1110    Psychiatry Consult Evaluation  Service Date: June 27, 2024 LOS:  LOS: 0 days  Chief Complaint I've been dealing with a lot but I keep it in.  Primary Psychiatric Diagnoses  Adjustment Disorder with depressed mood 2.  Depression, unspecified 3.  Moderate cannabis use disorder Assessment  Gabriel Nelson is a 23 y.o. male admitted: Presented to the EDfor 06/26/2024  9:27 PM for Per ED Provider Admission Assessment dated 06/26/2024@2125 : 23 year old male with adjustment disorder, substance abuse, presenting to the ED with worsening depression.  Family reports he has seemed more down than usual, he is coming up on the anniversary of his ex-girlfriend's death.  Patient admits this has been hard for him.  He states he has been very sad but does not give much further detail than this.  Family expressed concerns that patient might hurt himself, however  has not expressed any plan to do so.  He tells me he has been taking medications but family reports he has not.    He carries the psychiatric diagnoses of cannabis induced mood disorder, adjustment disorder with mixed anxiety and depressed mood depression and has a past medical history of  osteomyelitis, rhabdomyolysis.   Psych Assessment 06/27/2024: Patient was initially evaluated by psychiatry on 8/1, and referred for overnight observation and AM reassessment.  He was previously medically cleared; Labs reviewed, no medical contribution to symptoms identified.   His current presentation of depressed mood, low motivation, feelings of hopelessness, worthlessness for >2 weeks, is most consistent with recurrent depression.  He has a hx for depression but his sx are exacerbated d/t anniversary date of a personal loss.  He meets criteria for voluntary psychiatric admission based on above.  Current outpatient psychotropic medications include sertraline , olanzapine  and historically he has had a good response to these medications. He was non compliant with medications prior to admission as evidenced by patient reports.  After discussing his safety concerns related to untreated depressive symptoms, medication non-adherence and that he's not established for outpatient mental health, and marijuana use which can promote disinhibition and impulsivity, he accepts voluntary admission for psychiatric admission.  Plan is to restart antidepressant medications and safety monitoring.  He has a hx for HTN, blood pressure was elevated at 151/95 o admission, he is prescribed amlodipine  but has not been taking it, agrees to restart today.   On initial examination, patient is AA male who appears his  stated age; he's appropriately groomed and wearing hospital scrubs.  His appearance is not disheveled and he is not ill appearing. He's alert and oriented x5; He makes fair eye contact; speech is slow but clear and coherent.  His thoughts  are linear, devoid os psychosis, delusions or paranoia.  He denies suicidal or homicidal ideation.   Please see plan below for detailed recommendations.   Diagnoses:  Active Hospital problems: Principal Problem:   Adjustment disorder with depressed mood Active Problems:   Moderate cannabis use disorder (HCC)   Depression    Plan   ## Psychiatric Medication Recommendations:  -Restart sertraline  50mg  po daily for depression/PTSD -He declined to restart olanzapine  -Hydroxyzine  25mg  po TID prn anxiety -Restart amlodipine  5mg  po daily for HTN  ## Medical Decision Making Capacity: Not specifically addressed in this encounter  ## Further Work-up:  -- deferred TOC consult for substance abuse resources -- most recent EKG on 06/26/2024 had QtC of 435 -- Pertinent labwork reviewed earlier this admission includes: CMP,CBC, UDS, EKG  ## Disposition:-- We recommend inpatient psychiatric hospitalization when medically cleared. Patient is under voluntary admission status at this time; please IVC if attempts to leave hospital.  ## Behavioral / Environmental: - No specific recommendations at this time.     ## Safety and Observation Level:  - Based on my clinical evaluation, I estimate the patient to be at low risk of self harm in the current setting. - At this time, we recommend  routine. This decision is based on my review of the chart including patient's history and current presentation, interview of the patient, mental status examination, and consideration of suicide risk including evaluating suicidal ideation, plan, intent, suicidal or self-harm behaviors, risk factors, and protective factors. This judgment is based on our ability to directly address suicide risk, implement suicide prevention strategies, and develop a safety plan while the patient is in the clinical setting. Please contact our team if there is a concern that risk level has changed.  CSSR Risk Category:C-SSRS RISK CATEGORY: No  Risk  Suicide Risk Assessment: Patient has following modifiable risk factors for suicide: untreated depression, medication noncompliance, triggering events, and recent loss (death, isolation, vocation), which we are addressing by restarting his antidepressant medication and referring for voluntary admission where he can be monitored for mood stability. Patient has following non-modifiable or demographic risk factors for suicide: male gender and psychiatric hospitalization Patient has the following protective factors against suicide: Supportive family and Minor children in the home  Thank you for this consult request. Recommendations have been communicated to the primary team.  We will sign at this time.   Bernadette FORBES Barefoot, NP       History of Present Illness  Relevant Aspects of Hospital ED Course:  Admitted on 06/26/2024 for Per RN Triage note dated 06/26/2024@0929 :  Patient BIB EMS from home c/o depression. Per report patient more depressed than usual d/t patient ex girl friend death anniversary. Per report family is just worried that patient might hurt himself. Per report patient not taking his medication. Patient denies SI/HI.    Patient Report:  Patient is observed sitting on hospital bed, no apparent distress.  Patient is AA male who appears his stated age; he's appropriately groomed and wearing hospital scrubs.  His appearance is not disheveled and he is not ill appearing. He's alert and oriented x5; He makes fair eye contact; speech is slow but clear and coherent.  His thoughts are linear, devoid os psychosis, delusions or paranoia.  He denies suicidal or homicidal ideation.   He reports he slept okay, appetite is good, discussed what he had for breakfast.  He reports his primary reason for presenting to the hospital was d/t problems managing psychosocial stressors.  He states he did not recognize he needed help but since he's in the hospital he will not turn it down.  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 His symptoms have exacerbated since he stopped taking psychotropic medications several months ago.  He reports taking sertraline  and olanzapine  for depression, anxiety, PTSD.  He does not elaborate regarding PTSD, appears this is an uncomfortable topic for him and he requests not to talk about it.  He states combined medications previously helped, keep me calm and eat. Reports he's unsure why he stopped taking medications.  Medications were provided at his last ED visit 5 months ago; he was d/c to follow up with outpatient [facility]) but states he was busy and did not have time.  He reports smoking marijuana, 2-3 joints daily to help with meltdowns. He denies current withdrawal sx and denies hx for withdrawal symptoms when not using marijuana.  Per chart review, his family mentioned previous dx for schizophrenia, however, patient denies dx and states he's not experiencing AVH.  He also has a hx for cannabis induced psychotic disorder with hallucinations which he believes was where the discussion of  schizophrenia followed.  He reports he lives with his mother and 6 siblings whom he describes as supportive of him.  He states he has a 87 year old son that he cares for and serves as a protective factor for him.     Psych ROS:  Depression: yes Anxiety:  denies Mania (lifetime and current): denies Psychosis: (lifetime and current): denies  Collateral information:  Patient gave permission to talk with his mother Darolyn Mayer for collateral.   Review of Systems  Constitutional: Negative.   HENT: Negative.    Eyes: Negative.   Respiratory: Negative.    Cardiovascular: Negative.   Gastrointestinal: Negative.   Genitourinary: Negative.   Skin: Negative.   Neurological: Negative.    Endo/Heme/Allergies: Negative.   Psychiatric/Behavioral:  Positive for depression and substance abuse.      Psychiatric and Social History  Psychiatric History:  Information collected from patient and chart review  Prev Dx/Sx: depression, anxiety, PTSD, Cannabis abuse Current Psych Provider: none Home Meds (current): sertraline , olanzapine  Previous Med Trials: as listed above Therapy: denies  Prior Psych Hospitalization: yes  Prior Self Harm: he denies Prior Violence: has hx for aggressive behaviors  Family Psych History: unknown Family Hx suicide: unknown  Social History:  Developmental Hx: he denies concerns Educational Hx: 12 th grade diploma Occupational Hx:he states he is seeking employment, currently mows lawns for extra money Legal Hx: probation 2 years ago for drug and a gun; they tried to say I was concealed carrying but it was open carry. Living Situation: lives at home with his mom and 6 siblings; states they get along good,occasional disagreements Spiritual Hx: Christianity  Access to weapons/lethal means: denies   Substance History Alcohol: Per TTS counselor  Marijuana and alcohol. Last use was today an 8th of marijuana and 2-3 shots of alcohol. Patient reports daily use of marijuana 3x a day and weekly use of  alcohol, about once a week.   Tobacco: denies Illicit drugs: as listed above Prescription drug abuse: denies Rehab hx: denies  Exam Findings  Physical Exam:  Vital Signs:  Temp:  [98.9 F (37.2 C)-99.1 F (37.3 C)] 98.9 F (37.2 C) (08/02 1140) Pulse Rate:  [66-127] 66 (08/02 1140) Resp:  [16-20] 16 (08/02 1140) BP: (143-151)/(90-95) 143/90 (08/02 1140) SpO2:  [95 %-98 %] 98 % (08/02 1140) Weight:  [105 kg] 105 kg (08/01 2133) Blood pressure (!) 143/90, pulse 66, temperature 98.9 F (37.2 C), temperature source Oral, resp. rate 16, height 5' 10 (1.778 m), weight 105 kg, SpO2 98%. Body mass index is 33.21 kg/m.  Physical  Exam Cardiovascular:     Rate and Rhythm: Normal rate.     Pulses: Normal pulses.  Musculoskeletal:        General: Normal range of motion.     Cervical back: Normal range of motion.  Neurological:     Mental Status: He is alert and oriented to person, place, and time. Mental status is at baseline.  Psychiatric:        Attention and Perception: Attention normal.        Mood and Affect: Mood is depressed. Affect is blunt.        Speech: Speech normal.        Behavior: Behavior is slowed. Behavior is cooperative.        Thought Content: Thought content is not delusional. Thought content does not include homicidal or suicidal plan.        Cognition and Memory: Cognition and memory normal.        Judgment: Judgment is impulsive and inappropriate.     Mental Status Exam: General Appearance: Casual and Fairly Groomed  Orientation:  Full (Time, Place, and Person)  Memory:  Immediate;   Good Recent;   Fair Remote;   Fair  Concentration:  Concentration: Fair and Attention Span: Fair  Recall:  Fair  Attention  Fair  Eye Contact:  Fair  Speech:  Clear and Coherent and Slow  Language:  Good  Volume:  Decreased  Mood: Depressed  Affect:  Blunt, Congruent, and Depressed  Thought Process:  Coherent and Linear  Thought Content:  Logical  Suicidal Thoughts:  No  Homicidal Thoughts:  No  Judgement:  Fair  Insight:  Lacking  Psychomotor Activity:  Decreased  Akathisia:  No  Fund of Knowledge:  Fair      Assets:  Desire for Improvement Housing Social Support  Cognition:  WNL  ADL's:  Intact  AIMS (if indicated):        Other History   These have been pulled in through the EMR, reviewed, and updated if appropriate.  Family History:  The patient's family history includes Healthy in his mother.  Medical History: Past Medical History:  Diagnosis Date   ADHD    Depression    Hypertension    Medical history non-contributory     Surgical History: Past Surgical History:   Procedure Laterality Date   NO PAST SURGERIES       Medications:   Current Facility-Administered Medications:    amLODipine  (NORVASC ) tablet 5 mg, 5 mg, Oral, Daily, Josiane Labine E, NP, 5 mg at 06/27/24 1155   hydrOXYzine  (ATARAX ) tablet 25 mg, 25 mg, Oral, TID PRN, Rhyann Berton E, NP, 25 mg at 06/27/24 1155   sertraline  (ZOLOFT ) tablet 50 mg, 50 mg, Oral, Daily, Mayrene Bastarache E, NP, 50 mg at 06/27/24 1155  Current Outpatient Medications:  amLODipine  (NORVASC ) 5 MG tablet, Take 1 tablet (5 mg total) by mouth daily. (Patient not taking: Reported on 06/27/2024), Disp: 30 tablet, Rfl: 0   OLANZapine  zydis (ZYPREXA ) 5 MG disintegrating tablet, Take 1 tablet (5 mg total) by mouth at bedtime. (Patient not taking: Reported on 06/27/2024), Disp: 30 tablet, Rfl: 0   sertraline  (ZOLOFT ) 50 MG tablet, Take 1 tablet (50 mg total) by mouth daily. (Patient not taking: Reported on 06/27/2024), Disp: 30 tablet, Rfl: 0  Allergies: No Known Allergies  Bernadette FORBES Barefoot, NP

## 2024-06-27 NOTE — Tx Team (Addendum)
 Initial Treatment Plan 06/27/2024 6:36 PM [facility] [id number]    PATIENT STRESSORS: Medication change or noncompliance   Traumatic event     PATIENT STRENGTHS: Average or above average intelligence  Capable of independent living  Communication skills  Motivation for treatment/growth  Physical Health  Supportive family/friends    PATIENT IDENTIFIED PROBLEMS: Increased depression      I need to have help with getting my emotions under control               DISCHARGE CRITERIA:  Improved stabilization in mood, thinking, and/or behavior Need for constant or close observation no longer present Verbal commitment to aftercare and medication compliance  PRELIMINARY DISCHARGE PLAN: Outpatient therapy Return to previous living arrangement  PATIENT/FAMILY INVOLVEMENT: This treatment plan has been presented to and reviewed with the patient, Gabriel Nelson, The patient has been given the opportunity to ask questions and make suggestions.  Berwyn GORMAN Acosta, RN 06/27/2024, 6:36 PM

## 2024-06-27 NOTE — Progress Notes (Signed)
 Psychoeducational Group Note  Date:  06/27/2024 Time:  2038  Group Topic/Focus:  Wrap-Up Group:   The focus of this group is to help patients review their daily goal of treatment and discuss progress on daily workbooks.  Participation Level: Did Not Attend  Participation Quality:  Not Applicable  Affect:  Not Applicable  Cognitive:  Not Applicable  Insight:  Not Applicable  Engagement in Group: Not Applicable  Additional Comments:  The patient did not attend group this evening.   Myldred Raju S 06/27/2024, 8:38 PM

## 2024-06-27 NOTE — Plan of Care (Signed)
   Problem: Safety: Goal: Periods of time without injury will increase Outcome: Progressing

## 2024-06-27 NOTE — ED Notes (Signed)
 Report given to [facility]. Safe Transport en route.

## 2024-06-27 NOTE — Progress Notes (Signed)
 [facility] LCSW Progress Note   06/27/2024    3:07 PM  Gabriel Nelson   [id number]   Type of Contact and Topic:  Psychiatric Bed Placement   Pt accepted to [facility] 300-2     Patient meets inpatient criteria per Bernadette Barefoot, NP    The attending provider will be Dr. Raliegh  Call report to [phone]    [name], RN @ [facility] ED notified.     Pt scheduled  to arrive at [facility] TODAY.    [name], MSW, LCSW-A  3:08 PM 06/27/2024

## 2024-06-28 ENCOUNTER — Encounter (HOSPITAL_COMMUNITY): Payer: Self-pay | Admitting: Adult Health

## 2024-06-28 DIAGNOSIS — F332 Major depressive disorder, recurrent severe without psychotic features: Principal | ICD-10-CM

## 2024-06-28 DIAGNOSIS — F12288 Cannabis dependence with other cannabis-induced disorder: Secondary | ICD-10-CM

## 2024-06-28 DIAGNOSIS — F4321 Adjustment disorder with depressed mood: Secondary | ICD-10-CM | POA: Insufficient documentation

## 2024-06-28 MED ORDER — NICOTINE POLACRILEX 2 MG MT GUM
CHEWING_GUM | OROMUCOSAL | Status: AC
  Filled 2024-06-28: qty 1

## 2024-06-28 MED ORDER — MELATONIN 5 MG PO TABS
5.0000 mg | ORAL_TABLET | Freq: Every evening | ORAL | Status: DC | PRN
Start: 2024-06-28 — End: 2024-07-01
  Administered 2024-06-29: 5 mg via ORAL
  Filled 2024-06-28: qty 1

## 2024-06-28 MED ORDER — NICOTINE POLACRILEX 2 MG MT GUM
2.0000 mg | CHEWING_GUM | OROMUCOSAL | Status: DC | PRN
  Administered 2024-06-28 – 2024-06-30 (×2): 2 mg via ORAL
  Filled 2024-06-28 (×2): qty 1

## 2024-06-28 MED ORDER — FLUOXETINE HCL 10 MG PO CAPS
10.0000 mg | ORAL_CAPSULE | Freq: Every day | ORAL | Status: AC
  Administered 2024-06-29: 10 mg via ORAL
  Filled 2024-06-28: qty 1

## 2024-06-28 MED ORDER — FLUOXETINE HCL 20 MG PO CAPS
20.0000 mg | ORAL_CAPSULE | Freq: Every day | ORAL | Status: DC
  Administered 2024-06-30 – 2024-07-01 (×2): 20 mg via ORAL
  Filled 2024-06-28 (×2): qty 1

## 2024-06-28 NOTE — Progress Notes (Addendum)
 D. Pt has been appropriate during interactions- visible in the dayroom interacting well with peers and attending group- Pt reported that he slept well last night, described his appetite as 'fair', energy level as 'normal', and concentration as 'good'. Per pt's self inventory, pt rated his depression,hopelessness and anxiety all 0's.  Pt currently denies SI/HI and AVH and doesn't appear to be responding to internal stimuli.  A. Labs and vitals monitored. Pt given and educated on medications. Pt supported emotionally and encouraged to express concerns and ask questions.   R. Pt remains safe with 15 minute checks. Will continue POC.    06/28/24 1000  Psych Admission Type (Psych Patients Only)  Admission Status Voluntary  Psychosocial Assessment  Patient Complaints Anxiety  Eye Contact Fair  Facial Expression Anxious  Affect Appropriate to circumstance  Speech Logical/coherent  Interaction Guarded  Motor Activity Other (Comment) (steady gait)  Appearance/Hygiene Unremarkable  Behavior Characteristics Cooperative  Mood Anxious;Pleasant  Thought Process  Coherency WDL  Content WDL  Delusions None reported or observed  Perception WDL  Hallucination None reported or observed  Judgment Impaired  Confusion None  Danger to Self  Current suicidal ideation? Denies  Danger to Others  Danger to Others None reported or observed

## 2024-06-28 NOTE — H&P (Signed)
 Psychiatric Admission Assessment Adult  Patient Identification: Gabriel Nelson MRN:  [id number] Date of Evaluation:  06/28/2024  Chief Complaint:   MDD (major depressive disorder), recurrent severe, without psychosis (HCC)  Principal Problem:   MDD (major depressive disorder), recurrent severe, without psychosis (HCC) Active Problems:   Moderate cannabis use disorder (HCC)   Cannabis-induced mood disorder (HCC)   Grief   History of Present Illness:  Gabriel Nelson is a 23 y.o., male with history of adjustment disorder and cannabis use disorder presenting from [place], ED to [facility] due to worsening depression.  Current psychosocial stressor is anniversary of ex-girlfriend's death as well as friend passing away a few months ago.  Patient takes sertraline , has been with good response to this medication but there is concern for nonadherence to medications.  Patient is uncertain why he was recently struggling with worsening depression.  He reports that he does not notice when he has severe depression and is very insidious.  He does endorse depressed mood, anhedonia, sleep disturbance, poor concentration, guilt.  Denies ever experiencing any suicidal ideations.  Reports recently struggling with worsening depression and anxiety secondary to decreasing cannabis use.  He does feel a significant dependence on cannabis as he has had significant amount of time spent obtaining cannabis and using it.  He denies any other substance use with the exception of weekly drinking 2-3 shots of alcohol.  He denies history of mania or psychosis outside of cannabis use.  He endorses being laced with K2 in the past which has resulted in psychosis but denies ever experiencing psychotic symptoms outside of it.  He endorses history of trauma that has resulted in hypervigilance, negative alteration in mood, intrusive thoughts, flashbacks.  He denies nightmares.  He reports struggling with anxiety at times but denies panic  attacks.  He reports struggling with hypertension and sometimes feels very warm to the touch.  He is uncertain what is causing this but would like to establish care with a PCP to manage his medical health.  He also endorses history of blacking out although is unclear whether he was signs of seizures or some other etiology.  Endorses at times feeling as if there is a postictal component to it and it lasting a few minutes at a time..  Patient was amenable to me calling mother or sister for collateral.  Will reach out to them tomorrow.   Past Psychiatric History:  Previous psych diagnoses: cannabis use disorder, adjustment disorder Prior inpatient psychiatric treatment: 5x for AMS secondary to cannabis use Prior outpatient psychiatric treatment: denies Current psychiatric provider: none Current therapist:denies  History of suicide attempts: denies History of homicide: denies  Past Psychotropics: sertraline , olanzapine   Substance Use History: Alcohol: 2-3 shots a week Tobacco: denies Illicit Substance: denies Cannabis: several times per day    Is the patient at risk to self? Yes Has the patient been a risk to self in the past 6 months? Yes Has the patient been a risk to self within the distant past? No Is the patient a risk to others? No Has the patient been a risk to others in the past 6 months? No Has the patient been a risk to others within the distant past? Yes  Alcohol Screening: 1. How often do you have a drink containing alcohol?: Never 2. How many drinks containing alcohol do you have on a typical day when you are drinking?: 1 or 2 3. How often do you have six or more drinks on one occasion?: Never AUDIT-C  Score: 0 4. How often during the last year have you found that you were not able to stop drinking once you had started?: Never 5. How often during the last year have you failed to do what was normally expected from you because of drinking?: Never 6. How often during  the last year have you needed a first drink in the morning to get yourself going after a heavy drinking session?: Never 7. How often during the last year have you had a feeling of guilt of remorse after drinking?: Never 8. How often during the last year have you been unable to remember what happened the night before because you had been drinking?: Never 9. Have you or someone else been injured as a result of your drinking?: No 10. Has a relative or friend or a doctor or another health worker been concerned about your drinking or suggested you cut down?: No Alcohol Use Disorder Identification Test Final Score (AUDIT): 0 Tobacco Screening:    Substance Abuse History in the last 12 months: Yes  Allergies:  No Known Allergies  Past Medical/Surgical History:  Medical Diagnoses: osteomyelitis, rhabdomyolysis, hypertension Home Rx: amlodipine  Prior Hosp: none Prior Surgeries / non-head trauma: none  Head trauma: denies LOC: denies Seizures: unknown  Family History:  Family History  Problem Relation Age of Onset   Healthy Mother     Social History:  Developmental Hx: he denies concerns Educational Hx: 12 th grade diploma Occupational Hx:he states he is seeking employment, currently mows lawns for extra money Legal Hx: probation 2 years ago for drug and a gun; they tried to say I was concealed carrying but it was open carry. Living Situation: lives at home with his mom and 6 siblings; states they get along good,occasional disagreements Spiritual Hx: Christianity   Lab Results:  Results for orders placed or performed during the hospital encounter of 06/26/24 (from the past 48 hours)  Comprehensive metabolic panel     Status: Abnormal   Collection Time: 06/26/24  9:42 PM  Result Value Ref Range   Sodium 137 135 - 145 mmol/L   Potassium 3.6 3.5 - 5.1 mmol/L   Chloride 104 98 - 111 mmol/L   CO2 21 (L) 22 - 32 mmol/L   Glucose, Bld 123 (H) 70 - 99 mg/dL    Comment: Glucose  reference range applies only to samples taken after fasting for at least 8 hours.   BUN 15 6 - 20 mg/dL   Creatinine, Ser 8.55 (H) 0.61 - 1.24 mg/dL   Calcium 9.5 8.9 - 89.6 mg/dL   Total Protein 8.2 (H) 6.5 - 8.1 g/dL   Albumin 4.4 3.5 - 5.0 g/dL   AST 23 15 - 41 U/L   ALT 23 0 - 44 U/L   Alkaline Phosphatase 58 38 - 126 U/L   Total Bilirubin 0.9 0.0 - 1.2 mg/dL   GFR, Estimated >39 >39 mL/min    Comment: (NOTE) Calculated using the CKD-EPI Creatinine Equation (2021)    Anion gap 12 5 - 15    Comment: Performed at [facility], [address], [place], [place] [address]  Ethanol     Status: None   Collection Time: 06/26/24  9:42 PM  Result Value Ref Range   Alcohol, Ethyl (B) <15 <15 mg/dL    Comment: (NOTE) For medical purposes only. Performed at [facility], [address], [place], [place] [address]   cbc     Status: None   Collection  Time: 06/26/24  9:42 PM  Result Value Ref Range   WBC 8.8 4.0 - 10.5 K/uL    Comment: WHITE COUNT CONFIRMED ON SMEAR   RBC 5.43 4.22 - 5.81 MIL/uL   Hemoglobin 15.2 13.0 - 17.0 g/dL   HCT 52.4 60.9 - 47.9 %   MCV 87.5 80.0 - 100.0 fL   MCH 28.0 26.0 - 34.0 pg   MCHC 32.0 30.0 - 36.0 g/dL   RDW 87.4 88.4 - 84.4 %   Platelets 225 150 - 400 K/uL   nRBC 0.0 0.0 - 0.2 %    Comment: Performed at [facility], [address] [place], [place] [address]  Rapid urine drug screen (hospital performed)     Status: Abnormal   Collection Time: 06/26/24  9:48 PM  Result Value Ref Range   Opiates NONE DETECTED NONE DETECTED   Cocaine NONE DETECTED NONE DETECTED   Benzodiazepines NONE DETECTED NONE DETECTED   Amphetamines NONE DETECTED NONE DETECTED   Tetrahydrocannabinol POSITIVE (A) NONE DETECTED   Barbiturates NONE DETECTED NONE DETECTED    Comment: (NOTE) DRUG SCREEN FOR MEDICAL PURPOSES ONLY.  IF CONFIRMATION IS NEEDED FOR ANY PURPOSE, NOTIFY LAB WITHIN 5 DAYS.  LOWEST DETECTABLE  LIMITS FOR URINE DRUG SCREEN Drug Class                     Cutoff (ng/mL) Amphetamine and metabolites    1000 Barbiturate and metabolites    200 Benzodiazepine                 200 Opiates and metabolites        300 Cocaine and metabolites        300 THC                            50 Performed at [facility], [address], [place], [place] [address]   CBG monitoring, ED     Status: Abnormal   Collection Time: 06/26/24 10:27 PM  Result Value Ref Range   Glucose-Capillary 111 (H) 70 - 99 mg/dL    Comment: Glucose reference range applies only to samples taken after fasting for at least 8 hours.    Blood Alcohol level:  Lab Results  Component Value Date   [facility] <15 06/26/2024   ETH <10 01/13/2024    Metabolic Disorder Labs:  Lab Results  Component Value Date   HGBA1C 4.6 (L) 04/06/2020   MPG 85.32 04/06/2020   No results found for: PROLACTIN Lab Results  Component Value Date   CHOL 131 04/06/2020   TRIG 34 04/06/2020   HDL 35 (L) 04/06/2020   CHOLHDL 3.7 04/06/2020   VLDL 7 04/06/2020   LDLCALC 89 04/06/2020    Current Medications: Current Facility-Administered Medications  Medication Dose Route Frequency Provider Last Rate Last Admin   acetaminophen  (TYLENOL ) tablet 650 mg  650 mg Oral Q6H PRN Mills, Shnese E, NP       alum & mag hydroxide-simeth (MAALOX/MYLANTA) 200-200-20 MG/5ML suspension 30 mL  30 mL Oral Q4H PRN Mills, Shnese E, NP       amLODipine  (NORVASC ) tablet 5 mg  5 mg Oral Daily Mills, Shnese E, NP   5 mg at 06/28/24 9187   haloperidol  (HALDOL ) tablet 5 mg  5 mg Oral TID PRN Moishe Bernadette BRAVO, NP       And   diphenhydrAMINE  (BENADRYL ) capsule 50 mg  50  mg Oral TID PRN Mills, Shnese E, NP       haloperidol  lactate (HALDOL ) injection 5 mg  5 mg Intramuscular TID PRN Moishe Bernadette BRAVO, NP       And   diphenhydrAMINE  (BENADRYL ) injection 50 mg  50 mg Intramuscular TID PRN Moishe Bernadette BRAVO, NP       And   LORazepam  (ATIVAN ) injection 2 mg   2 mg Intramuscular TID PRN Mills, Shnese E, NP       haloperidol  lactate (HALDOL ) injection 10 mg  10 mg Intramuscular TID PRN Moishe Bernadette BRAVO, NP       And   diphenhydrAMINE  (BENADRYL ) injection 50 mg  50 mg Intramuscular TID PRN Moishe Bernadette BRAVO, NP       And   LORazepam  (ATIVAN ) injection 2 mg  2 mg Intramuscular TID PRN Mills, Shnese E, NP       hydrOXYzine  (ATARAX ) tablet 25 mg  25 mg Oral TID PRN Mills, Shnese E, NP       magnesium  hydroxide (MILK OF MAGNESIA) suspension 30 mL  30 mL Oral Daily PRN Mills, Shnese E, NP        PTA Medications: Medications Prior to Admission  Medication Sig Dispense Refill Last Dose/Taking   amLODipine  (NORVASC ) 5 MG tablet Take 1 tablet (5 mg total) by mouth daily. (Patient not taking: Reported on 06/27/2024) 30 tablet 0    OLANZapine  zydis (ZYPREXA ) 5 MG disintegrating tablet Take 1 tablet (5 mg total) by mouth at bedtime. (Patient not taking: Reported on 06/27/2024) 30 tablet 0    sertraline  (ZOLOFT ) 50 MG tablet Take 1 tablet (50 mg total) by mouth daily. (Patient not taking: Reported on 06/27/2024) 30 tablet 0     Physical Findings: AIMS: No  CIWA:    COWS:     Psychiatric Specialty Exam: General Appearance: Appropriate for Environment; Casual   Eye Contact: Fair   Speech: Clear and Coherent; Normal Rate   Volume: Decreased   Mood: Anxious; Depressed   Affect: Appropriate; Congruent   Thought Content: Logical   Suicidal Thoughts: Suicidal Thoughts: No   Homicidal Thoughts: Homicidal Thoughts: No   Thought Process: Coherent; Goal Directed; Linear   Orientation: Full (Time, Place and Person)     Memory: Remote Good   Judgment: Intact   Insight: Fair   Concentration: Fair   Recall: Fair   Fund of Knowledge: Fair   Language: Fair   Psychomotor Activity: Psychomotor Activity: Normal   Assets: Manufacturing systems engineer; Desire for Improvement; Financial Resources/Insurance; Physical Health; Resilience; Social Support   Sleep:  Sleep: Fair    Review of Systems Review of Systems  Respiratory:  Negative for shortness of breath.   Cardiovascular:  Negative for chest pain.  Gastrointestinal:  Negative for abdominal pain, constipation, diarrhea, heartburn, nausea and vomiting.  Neurological:  Negative for headaches.    Vital signs: Blood pressure 124/73, pulse (!) 59, temperature 98.1 F (36.7 C), temperature source Oral, resp. rate 16, height 5' 10 (1.778 m), weight 100.5 kg, SpO2 98%. Body mass index is 31.8 kg/m. Physical Exam Constitutional:      Appearance: Normal appearance.  HENT:     Head: Normocephalic and atraumatic.  Eyes:     Extraocular Movements: Extraocular movements intact.     Conjunctiva/sclera: Conjunctivae normal.     Pupils: Pupils are equal, round, and reactive to light.  Cardiovascular:     Rate and Rhythm: Normal rate.     Pulses: Normal pulses.  Pulmonary:  Effort: Pulmonary effort is normal.  Musculoskeletal:        General: Normal range of motion.     Cervical back: Normal range of motion.  Skin:    General: Skin is warm and dry.  Neurological:     General: No focal deficit present.     Mental Status: He is alert and oriented to person, place, and time. Mental status is at baseline.      Treatment Plan Summary: Daily contact with patient to assess and evaluate symptoms and progress in treatment and medication management  ASSESSMENT: Patient symptoms consistent with MDD and cannabis use disorder.  Given history of medication nonadherence, will start fluoxetine  instead of sertraline .  Unclear purpose of olanzapine  at this time so we will discontinue this for now.  Would recommend patient attend [facility] and other substance use groups to aid with substance use cessation.  Patient is presently contemplative regarding cessation of cannabis use.  Unclear reason for hypertension but may be genetic.  Will obtain TSH and vitamin D  to evaluate for other etiologies that may be  contributing to ongoing symptoms of depression.  Patient is mildly bradycardic but was tachycardic in the ED.  Will continue to monitor.   PLAN: Safety and Monitoring:  -- Voluntary admission to inpatient psychiatric unit for safety, stabilization and treatment  -- Daily contact with patient to assess and evaluate symptoms and progress in treatment  -- Patient's case to be discussed in multi-disciplinary team meeting  -- Observation Level : q15 minute checks  -- Vital signs: q12 hours  -- Precautions: suicide, elopement, and assault  2. Psychiatric Problems #Major depressive disorder, recurrent episode, severe without psychotic features #Cannabis use disorder #Cannabis induced mood disorder #Grief #PTSD -Stopped sertraline  and olanzapine  - Start fluoxetine  10 mg for 1 day then increase to 20 mg -Advised cessation of THC/cannabis use  -PRNs: maalox, milk of magnesia, hydroxyzine , trazodone  -- As needed agitation protocol in-place  The risks/benefits/side-effects/alternatives to the above medication were discussed in detail with the patient and time was given for questions. The patient consents to medication trial. FDA black box warnings, if present, were discussed.  The patient is agreeable with the medication plan, as above. We will monitor the patient's response to pharmacologic treatment, and adjust medications as necessary.  3. Medical Problems Hypertension - Continue amlodipine  5 mg   4. Routine and other pertinent labs:   Metabolism / endocrine: BMI: Body mass index is 31.8 kg/m. Prolactin: No results found for: PROLACTIN Lipid Panel: Lab Results  Component Value Date   CHOL 131 04/06/2020   TRIG 34 04/06/2020   HDL 35 (L) 04/06/2020   CHOLHDL 3.7 04/06/2020   VLDL 7 04/06/2020   LDLCALC 89 04/06/2020   HbgA1c: Hgb A1c MFr Bld (%)  Date Value  04/06/2020 4.6 (L)   TSH: TSH (uIU/mL)  Date Value  04/06/2020 1.387    5. Group Therapy:  -- Encouraged  patient to participate in unit milieu and in scheduled group therapies   -- Short Term Goals: Ability to identify changes in lifestyle to reduce recurrence of condition, verbalize feelings, identify and develop effective coping behaviors, maintain clinical measurements within normal limits, and identify triggers associated with substance abuse/mental health issues will improve. Improvement in ability to demonstrate self-control and comply with prescribed medications.  -- Long Term Goals: Improvement in symptoms so as ready for discharge -- Patient is encouraged to participate in group therapy while admitted to the psychiatric unit. -- We will address other  chronic and acute stressors, which contributed to the patient's MDD (major depressive disorder), recurrent severe, without psychosis (HCC) in order to reduce the risk of self-harm at discharge.  6. Discharge Planning:   -- Social work and case management to assist with discharge planning and identification of hospital follow-up needs prior to discharge  -- Estimated LOS: 5-7 days  -- Discharge Concerns: Need to establish a safety plan; Medication compliance and effectiveness  -- Discharge Goals: Return home with outpatient referrals for mental health follow-up including medication management/psychotherapy  I certify that inpatient services furnished can reasonably be expected to improve the patient's condition.   Signed: Prentice Espy, MD 06/28/2024, 1:55 PM

## 2024-06-28 NOTE — [facility] Group Notes (Signed)
 Adult Psychoeducational Group Note  Date:  06/28/2024 Time:  9:19 PM  Group Topic/Focus:  Wrap-Up Group:   The focus of this group is to help patients review their daily goal of treatment and discuss progress on daily workbooks.  Participation Level:  Active  Participation Quality:  Appropriate  Affect:  Appropriate  Cognitive:  Appropriate  Insight: Appropriate  Engagement in Group:  Engaged  Modes of Intervention:  Discussion and Support  Additional Comments:  Pt told that today was a good day on the unit, the highlight of which was talking to his one-year-old son on the phone. On the subject of goals for the coming week, Pt mentioned being hopeful to discharge. Pt rated his day an 8 out of 10.  Gabriel Nelson 06/28/2024, 9:19 PM

## 2024-06-28 NOTE — [facility] Suicide Risk Assessment (Signed)
 [facility] Admission Suicide Risk Assessment   Nursing information obtained from:  Patient Demographic factors:  Male Current Mental Status:  NA Loss Factors:  NA Historical Factors:  Anniversary of important loss, Impulsivity Risk Reduction Factors:  Positive therapeutic relationship, Positive social support, Responsible for children under 25 years of age, Sense of responsibility to family, Living with another person, especially a relative  Total Time spent with patient:: 1 Hour Principal Problem: MDD (major depressive disorder), recurrent severe, without psychosis (HCC) Diagnosis:  Principal Problem:   MDD (major depressive disorder), recurrent severe, without psychosis (HCC) Active Problems:   Moderate cannabis use disorder (HCC)   Cannabis-induced mood disorder (HCC)   Grief   Subjective Data:  Gabriel Nelson is a 23 y.o., male with history of adjustment disorder and cannabis use disorder presenting from [facility], ED to [facility] due to worsening depression.  Current psychosocial stressor is anniversary of ex-girlfriend's death as well as friend passing away a few months ago.  Patient takes sertraline , has been with good response to this medication but there is concern for nonadherence to medications.   Patient is uncertain why he was recently struggling with worsening depression.  He reports that he does not notice when he has severe depression and is very insidious.  He does endorse depressed mood, anhedonia, sleep disturbance, poor concentration, guilt.  Denies ever experiencing any suicidal ideations.  Reports recently struggling with worsening depression and anxiety secondary to decreasing cannabis use.  He does feel a significant dependence on cannabis as he has had significant amount of time spent obtaining cannabis and using it.  He denies any other substance use with the exception of weekly drinking 2-3 shots of alcohol.   He denies history of mania or psychosis outside of cannabis use.   He endorses being laced with K2 in the past which has resulted in psychosis but denies ever experiencing psychotic symptoms outside of it.   He endorses history of trauma that has resulted in hypervigilance, negative alteration in mood, intrusive thoughts, flashbacks.  He denies nightmares.  He reports struggling with anxiety at times but denies panic attacks.   He reports struggling with hypertension and sometimes feels very warm to the touch.  He is uncertain what is causing this but would like to establish care with a PCP to manage his medical health.  He also endorses history of blacking out although is unclear whether he was signs of seizures or some other etiology.  Endorses at times feeling as if there is a postictal component to it and it lasting a few minutes at a time..  Continued Clinical Symptoms:  Alcohol Use Disorder Identification Test Final Score (AUDIT): 0 The Alcohol Use Disorders Identification Test, Guidelines for Use in Primary Care, Second Edition.  World Science writer [facility]). Score between 0-7:  no or low risk or alcohol related problems. Score between 8-15:  moderate risk of alcohol related problems. Score between 16-19:  high risk of alcohol related problems. Score 20 or above:  warrants further diagnostic evaluation for alcohol dependence and treatment.   CLINICAL FACTORS:   Severe Anxiety and/or Agitation Alcohol/Substance Abuse/Dependencies More than one psychiatric diagnosis Previous Psychiatric Diagnoses and Treatments   Musculoskeletal: Strength & Muscle Tone: within normal limits Gait & Station: normal Patient leans: N/A  Psychiatric Specialty Exam:  Presentation  General Appearance:  Appropriate for Environment; Casual   Eye Contact: Fair   Speech: Clear and Coherent; Normal Rate   Speech Volume: Decreased   Handedness: Right  Mood and Affect  Mood: Anxious; Depressed   Affect: Appropriate; Congruent    Thought  Process  Thought Processes: Coherent; Goal Directed; Linear   Descriptions of Associations:Intact   Orientation:Full (Time, Place and Person)   Thought Content:Logical   History of Schizophrenia/Schizoaffective disorder:No   Duration of Psychotic Symptoms:No data recorded  Hallucinations:Hallucinations: None   Ideas of Reference:None   Suicidal Thoughts:Suicidal Thoughts: No   Homicidal Thoughts:Homicidal Thoughts: No    Sensorium  Memory: Remote Good   Judgment: Intact   Insight: Fair    Art therapist  Concentration: Fair   Attention Span: Fair   Recall: [employer] of Knowledge: Fair   Language: Fair    Psychomotor Activity  Psychomotor Activity: Psychomotor Activity: Normal    Assets  Assets: Communication Skills; Desire for Improvement; Financial Resources/Insurance; Physical Health; Resilience; Social Support    Sleep  Sleep: Sleep: Fair       COGNITIVE FEATURES THAT CONTRIBUTE TO RISK:  None    SUICIDE RISK:   Minimal: No identifiable suicidal ideation.  Patients presenting with no risk factors but with morbid ruminations; may be classified as minimal risk based on the severity of the depressive symptoms  PLAN OF CARE: see H&P  I certify that inpatient services furnished can reasonably be expected to improve the patient's condition.   Prentice Espy, MD 06/28/2024, 2:00 PM

## 2024-06-28 NOTE — Group Note (Signed)
 Date:  06/28/2024 Time:  8:55 AM  Group Topic/Focus:  Goals Group:   The focus of this group is to help patients establish daily goals to achieve during treatment and discuss how the patient can incorporate goal setting into their daily lives to aide in recovery. Orientation:   The focus of this group is to educate the patient on the purpose and policies of crisis stabilization and provide a format to answer questions about their admission.  The group details unit policies and expectations of patients while admitted.    Participation Level:  Active  Participation Quality:  Appropriate  Affect:  Appropriate  Cognitive:  Oriented  Insight: Good  Engagement in Group:  Engaged  Modes of Intervention:  Orientation  Additional Comments:    Gabriel Nelson 06/28/2024, 8:55 AM

## 2024-06-28 NOTE — Plan of Care (Signed)
   Problem: Education: Goal: Emotional status will improve Outcome: Progressing Goal: Mental status will improve Outcome: Progressing
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
## 2024-06-28 NOTE — Progress Notes (Signed)
(  Sleep Hours) - 6.5 (Any PRNs that were needed, meds refused, or side effects to meds)- None (Any disturbances and when (visitation, over night)- None (Concerns raised by the patient)-  None (SI/HI/AVH)- Denies, contracts for safety

## 2024-06-28 NOTE — Plan of Care (Signed)
   Problem: Education: Goal: Knowledge of [facility] Education information/materials will improve Outcome: Progressing Goal: Emotional status will improve Outcome: Progressing Goal: Mental status will improve Outcome: Progressing

## 2024-06-29 ENCOUNTER — Encounter (HOSPITAL_COMMUNITY): Payer: Self-pay

## 2024-06-29 DIAGNOSIS — F4321 Adjustment disorder with depressed mood: Secondary | ICD-10-CM | POA: Diagnosis not present

## 2024-06-29 DIAGNOSIS — F12288 Cannabis dependence with other cannabis-induced disorder: Secondary | ICD-10-CM | POA: Diagnosis not present

## 2024-06-29 DIAGNOSIS — F4310 Post-traumatic stress disorder, unspecified: Secondary | ICD-10-CM | POA: Diagnosis not present

## 2024-06-29 DIAGNOSIS — F332 Major depressive disorder, recurrent severe without psychotic features: Secondary | ICD-10-CM | POA: Diagnosis not present

## 2024-06-29 LAB — LIPID PANEL
Cholesterol: 171 mg/dL (ref 0–200)
HDL: 39 mg/dL — ABNORMAL LOW (ref >40)
LDL Cholesterol: 118 mg/dL — ABNORMAL HIGH (ref 0–99)
Total CHOL/HDL Ratio: 4.4 ratio
Triglycerides: 69 mg/dL (ref <150)
VLDL: 14 mg/dL (ref 0–40)

## 2024-06-29 LAB — VITAMIN D 25 HYDROXY (VIT D DEFICIENCY, FRACTURES): Vit D, 25-Hydroxy: 32.3 ng/mL (ref 30–100)

## 2024-06-29 LAB — TSH: TSH: 1.664 u[IU]/mL (ref 0.350–4.500)

## 2024-06-29 MED ORDER — CLONIDINE HCL 0.1 MG PO TABS
0.1000 mg | ORAL_TABLET | Freq: Three times a day (TID) | ORAL | Status: DC | PRN
  Administered 2024-06-30: 0.1 mg via ORAL
  Filled 2024-06-29: qty 1

## 2024-06-29 NOTE — Progress Notes (Signed)
 [facility] MD Progress Note  06/29/2024 2:26 PM Gabriel Nelson  MRN:  [id number]  ID: 22 year old male with adjustment disorder, substance abuse, presenting to the ED with worsening depression. Family reports he has seemed more down than usual, he is coming up on the anniversary of his ex-girlfriend's death. Patient admits this has been hard for him. He states he has been very sad but does not give much further detail than this. Family expressed concerns that patient might hurt himself, however has not expressed any plan to do so. He tells me he has been taking medications but family reports he has no    Subjective:  Patient presents with depressed mood however states I'm doing great. States he slept 6 hours. HE denies SI, HI or AVH. He is not very forthcoming with me or social worker on evaluation but later admits that he has had almost 10 deaths of family and loved ones over the last  couple of years. States he also lost his best friend. Patient feels I need to deal with it on my own and admits he has a difficult time opening up to others. WE discussed that it may help him to work with a therapist outside the hospital as well. Patient denies wanting to be dead. Denies any thoughts of self harm. States he has things to live for.  Patient provided me contact number for his mother [phone]) and gave me permission to speak with her for collateral. Patient states he didn't wish to come to the hospital however his mother convinced him to. States sometimes I have episodes where I just space out and don't talk but denies any suicide attempts or thoughts of killing himself. I also screened him for prior manic episodes as patient admitted to having difficulty sleeping however he denied symptoms of mania in the past. Tolerating Prozac  well and denies side effects.  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      Social History   Substance and Sexual Activity  Drug Use Yes   Types: Marijuana   Comment: Daily. Last used: yesterday     Social History   Socioeconomic History   Marital status: Single    Spouse name: Not on file   Number of children: Not on file  Years of education: Not on file   Highest education level: Not on file  Occupational History   Not on file  Tobacco Use   Smoking status: Every Day    Current packs/day: 1.00    Average packs/day: 1 pack/day for 4.0 years (4.0 ttl pk-yrs)    Types: Cigarettes, Cigars   Smokeless tobacco: Never  Vaping Use   Vaping status: Every Day   Substances: Nicotine   Substance and Sexual Activity   Alcohol use: Yes    Comment: He states occassionally, reports last beer was when his brother died.    Drug use: Yes     Types: Marijuana    Comment: Daily. Last used: yesterday    Sexual activity: Yes    Comment: uta, patient not fully oriented  Other Topics Concern   Not on file  Social History Narrative   Not on file   Social Drivers of Health   Financial Resource Strain: Not on file  Food Insecurity: No Food Insecurity (06/27/2024)   Hunger Vital Sign    Worried About Running Out of Food in the Last Year: Never true    Ran Out of Food in the Last Year: Never true  Transportation Needs: No Transportation Needs (06/27/2024)   PRAPARE - Administrator, Civil Service (Medical): No    Lack of Transportation (Non-Medical): No  Physical Activity: Not on file  Stress: Not on file  Social Connections: Not on file   Additional Social History:                         Sleep: Fair Estimated Sleeping Duration (Last 24 Hours): 5.75-6.00 hours  Appetite:  Fair  Current Medications: Current Facility-Administered Medications  Medication Dose Route Frequency Provider Last Rate Last Admin   acetaminophen  (TYLENOL ) tablet 650 mg  650 mg Oral Q6H PRN Mills, Shnese E, NP       alum & mag hydroxide-simeth (MAALOX/MYLANTA) 200-200-20 MG/5ML suspension 30 mL  30 mL Oral Q4H PRN Mills, Shnese E, NP       amLODipine  (NORVASC ) tablet 5 mg  5 mg Oral Daily Mills, Shnese E, NP   5 mg at 06/29/24 9183   haloperidol  (HALDOL ) tablet 5 mg  5 mg Oral TID PRN Moishe Bernadette BRAVO, NP       And   diphenhydrAMINE  (BENADRYL ) capsule 50 mg  50 mg Oral TID PRN Mills, Shnese E, NP       haloperidol  lactate (HALDOL ) injection 5 mg  5 mg Intramuscular TID PRN Moishe Bernadette BRAVO, NP       And   diphenhydrAMINE  (BENADRYL ) injection 50 mg  50 mg Intramuscular TID PRN Moishe Bernadette BRAVO, NP       And   LORazepam  (ATIVAN ) injection 2 mg  2 mg Intramuscular TID PRN Mills, Shnese E, NP       haloperidol  lactate (HALDOL ) injection 10 mg  10 mg Intramuscular TID PRN Moishe Bernadette BRAVO, NP       And   diphenhydrAMINE  (BENADRYL )  injection 50 mg  50 mg Intramuscular TID PRN Moishe Bernadette BRAVO, NP       And   LORazepam  (ATIVAN ) injection 2 mg  2 mg Intramuscular TID PRN Moishe Bernadette BRAVO, NP       [START ON 06/30/2024] FLUoxetine  (PROZAC ) capsule 20 mg  20 mg Oral Daily Lynnette Barter, MD       hydrOXYzine  (ATARAX ) tablet 25 mg  25 mg Oral TID  PRN Mills, Shnese E, NP   25 mg at 06/28/24 2149   magnesium  hydroxide (MILK OF MAGNESIA) suspension 30 mL  30 mL Oral Daily PRN Mills, Shnese E, NP       melatonin tablet 5 mg  5 mg Oral QHS PRN Bobbitt, Shalon E, NP       nicotine  polacrilex (NICORETTE ) gum 2 mg  2 mg Oral PRN Pashayan, Alexander S, DO   2 mg at 06/28/24 8151    Lab Results:  Results for orders placed or performed during the hospital encounter of 06/27/24 (from the past 48 hours)  TSH     Status: None   Collection Time: 06/29/24  6:28 AM  Result Value Ref Range   TSH 1.664 0.350 - 4.500 uIU/mL    Comment: Performed by a 3rd Generation assay with a functional sensitivity of <=0.01 uIU/mL. Performed at [facility], [address], [place], [place] [address]   Lipid panel     Status: Abnormal   Collection Time: 06/29/24  6:28 AM  Result Value Ref Range   Cholesterol 171 0 - 200 mg/dL   Triglycerides 69 <849 mg/dL   HDL 39 (L) >59 mg/dL   Total CHOL/HDL Ratio 4.4 RATIO   VLDL 14 0 - 40 mg/dL   LDL Cholesterol 881 (H) 0 - 99 mg/dL    Comment:        Total Cholesterol/HDL:CHD Risk Coronary Heart Disease Risk Table                     Men   Women  1/2 Average Risk   3.4   3.3  Average Risk       5.0   4.4  2 X Average Risk   9.6   7.1  3 X Average Risk  23.4   11.0        Use the calculated Patient Ratio above and the CHD Risk Table to determine the patient's CHD Risk.        ATP III CLASSIFICATION (LDL):  <100     mg/dL   Optimal  899-870  mg/dL   Near or Above                    Optimal  130-159  mg/dL   Borderline  839-810  mg/dL   High  >809     mg/dL   Very High Performed at  [facility], [address], [place], [place] [address]   VITAMIN D  25 Hydroxy (Vit-D Deficiency, Fractures)     Status: None   Collection Time: 06/29/24  6:28 AM  Result Value Ref Range   Vit D, 25-Hydroxy 32.30 30 - 100 ng/mL    Comment: (NOTE) Vitamin D  deficiency has been defined by the Institute of Medicine  and an Endocrine Society practice guideline as a level of serum 25-OH  vitamin D  less than 20 ng/mL (1,2). The Endocrine Society went on to  further define vitamin D  insufficiency as a level between 21 and 29  ng/mL (2).  1. IOM (Institute of Medicine). 2010. Dietary reference intakes for  calcium and D. [place]: [employer]. 2. Holick MF, Binkley Dugger, Bischoff-Ferrari HA, et al. Evaluation,  treatment, and prevention of vitamin D  deficiency: an Endocrine  Society clinical practice guideline, JCEM. 2011 Jul; 96(7): 1911-30.  Performed at [facility], [address], [place], [place] [address]     Blood Alcohol level:  Lab Results  Component Value Date   [facility] <15 06/26/2024   ETH <10 01/13/2024    Metabolic Disorder Labs: Lab Results  Component Value Date   HGBA1C 4.6 (L) 04/06/2020   MPG 85.32 04/06/2020   No results found for: PROLACTIN Lab Results  Component Value Date   CHOL 171 06/29/2024   TRIG 69 06/29/2024   HDL 39 (L) 06/29/2024   CHOLHDL 4.4 06/29/2024   VLDL 14 06/29/2024   LDLCALC 118 (H) 06/29/2024   LDLCALC 89 04/06/2020    Physical Findings: AIMS:  ,  ,  ,  ,  ,  ,   CIWA:    COWS:     Musculoskeletal: Strength & Muscle Tone: within normal limits Gait & Station: normal Patient leans: N/A  Psychiatric Specialty Exam:  Presentation  General Appearance:  Appropriate for Environment  Eye Contact: Fair  Speech: Slow  Speech Volume: Decreased  Handedness: Right   Mood and Affect  Mood: Depressed; Anxious; Dysphoric  Affect: Congruent   Thought Process  Thought  Processes: Goal Directed  Descriptions of Associations:Intact  Orientation:Full (Time, Place and Person)  Thought Content:Logical  History of Schizophrenia/Schizoaffective disorder:No  Duration of Psychotic Symptoms:denies Hallucinations:Hallucinations: None  Ideas of Reference:None  Suicidal Thoughts:Suicidal Thoughts: No  Homicidal Thoughts:Homicidal Thoughts: No   Sensorium  Memory: Immediate Fair  Judgment: Fair  Insight: Fair   Art therapist  Concentration: Fair  Attention Span: Fair  Recall: [employer] of Knowledge: Fair  Language: Fair   Psychomotor Activity  Psychomotor Activity: Psychomotor Activity: Normal   Assets  Assets: Communication Skills; Desire for Improvement; Financial Resources/Insurance; Physical Health; Social Support   Sleep  Sleep: Sleep: Fair    Physical Exam: Physical Exam ROS Blood pressure (!) 137/100, pulse 88, temperature 98.3 F (36.8 C), temperature source Oral, resp. rate 14, height 5' 10 (1.778 m), weight 100.5 kg, SpO2 100%. Body mass index is 31.8 kg/m.   Assessment: 8/3 assessment by DR. JI Patient symptoms consistent with MDD and cannabis use disorder.  Given history of medication nonadherence, will start fluoxetine  instead of sertraline .  Unclear purpose of olanzapine  at this time so we will discontinue this for now.  Would recommend patient attend [facility] IOP and other substance use groups to aid with substance use cessation.  Patient is presently contemplative regarding cessation of cannabis use.  Unclear reason for hypertension but may be genetic.  Will obtain TSH and vitamin D  to evaluate for other etiologies that may be contributing to ongoing symptoms of depression.  Patient is mildly bradycardic but was tachycardic in the ED.  Will continue to monitor.  8/4: patient is not very forthcoming on evaluation and presents depressed but denies SI, HI or AVH. Does not appear to be responding to  internal stimuli. States he does not like medications and has a difficult time opening up to other. Notes multiple lossess of loved ones. Denies wanting to be dead however. Denies prior manic episodes or psychosis. Denies prior suicide attempts. Will obtain collateral from mother who asked patient to go to the hospital.      PLAN: Safety and Monitoring:             -- Voluntary admission to inpatient psychiatric unit for safety, stabilization and treatment             -- Daily contact with patient to assess and evaluate symptoms and progress in treatment             -- Patient's  case to be discussed in multi-disciplinary team meeting             -- Observation Level : q15 minute checks             -- Vital signs: q12 hours             -- Precautions: suicide, elopement, and assault   2. Psychiatric Problems #Major depressive disorder, recurrent episode, severe without psychotic features #Cannabis use disorder #Cannabis induced mood disorder #Grief #PTSD -Stopped sertraline  and olanzapine  - cont fluoxetine  10 mg for 1 day then increase to 20 mg -Advised cessation of THC/cannabis use   -PRNs: maalox, milk of magnesia, hydroxyzine , trazodone  -- As needed agitation protocol in-place   The risks/benefits/side-effects/alternatives to the above medication were discussed in detail with the patient and time was given for questions. The patient consents to medication trial. FDA black box warnings, if present, were discussed.   The patient is agreeable with the medication plan, as above. We will monitor the patient's response to pharmacologic treatment, and adjust medications as necessary.   3. Medical Problems Hypertension - Continue amlodipine  5 mg     4. Routine and other pertinent labs:     Metabolism / endocrine: BMI: Body mass index is 31.8 kg/m. Prolactin: Recent Labs  No results found for: PROLACTIN   Lipid Panel: Recent Labs       Lab Results  Component Value Date     CHOL 131 04/06/2020    TRIG 34 04/06/2020    HDL 35 (L) 04/06/2020    CHOLHDL 3.7 04/06/2020    VLDL 7 04/06/2020    LDLCALC 89 04/06/2020      HbgA1c: Last Labs     Hgb A1c MFr Bld (%)  Date Value  04/06/2020 4.6 (L)      TSH: Last Labs     TSH (uIU/mL)  Date Value  04/06/2020 1.387        5. Group Therapy:             -- Encouraged patient to participate in unit milieu and in scheduled group therapies              -- Short Term Goals: Ability to identify changes in lifestyle to reduce recurrence of condition, verbalize feelings, identify and develop effective coping behaviors, maintain clinical measurements within normal limits, and identify triggers associated with substance abuse/mental health issues will improve. Improvement in ability to demonstrate self-control and comply with prescribed medications.             -- Long Term Goals: Improvement in symptoms so as ready for discharge -- Patient is encouraged to participate in group therapy while admitted to the psychiatric unit. -- We will address other chronic and acute stressors, which contributed to the patient's MDD (major depressive disorder), recurrent severe, without psychosis (HCC) in order to reduce the risk of self-harm at discharge.   6. Discharge Planning:              -- Social work and case management to assist with discharge planning and identification of hospital follow-up needs prior to discharge             -- Estimated LOS: 5-7 days             -- Discharge Concerns: Need to establish a safety plan; Medication compliance and effectiveness             -- Discharge Goals: Return home with outpatient referrals for mental  health follow-up including medication management/psychotherapy   I certify that inpatient services furnished can reasonably be expected to improve the patient's condition.  Jearlene Bridwell, MD 06/29/2024, 2:26 PM

## 2024-06-29 NOTE — BH IP Treatment Plan (Signed)
 Interdisciplinary Treatment and Diagnostic Plan Update  06/29/2024 Time of Session: 11:05 am Gabriel Nelson MRN: [id number]  Principal Diagnosis: MDD (major depressive disorder), recurrent severe, without psychosis (HCC)  Secondary Diagnoses: Principal Problem:   MDD (major depressive disorder), recurrent severe, without psychosis (HCC) Active Problems:   Moderate cannabis use disorder (HCC)   Cannabis-induced mood disorder (HCC)   Grief   Current Medications:  Current Facility-Administered Medications  Medication Dose Route Frequency Provider Last Rate Last Admin   acetaminophen  (TYLENOL ) tablet 650 mg  650 mg Oral Q6H PRN Mills, Shnese E, NP       alum & mag hydroxide-simeth (MAALOX/MYLANTA) 200-200-20 MG/5ML suspension 30 mL  30 mL Oral Q4H PRN Mills, Shnese E, NP       amLODipine  (NORVASC ) tablet 5 mg  5 mg Oral Daily Mills, Shnese E, NP   5 mg at 06/29/24 9183   haloperidol  (HALDOL ) tablet 5 mg  5 mg Oral TID PRN Moishe Bernadette BRAVO, NP       And   diphenhydrAMINE  (BENADRYL ) capsule 50 mg  50 mg Oral TID PRN Mills, Shnese E, NP       haloperidol  lactate (HALDOL ) injection 5 mg  5 mg Intramuscular TID PRN Moishe Bernadette BRAVO, NP       And   diphenhydrAMINE  (BENADRYL ) injection 50 mg  50 mg Intramuscular TID PRN Moishe Bernadette BRAVO, NP       And   LORazepam  (ATIVAN ) injection 2 mg  2 mg Intramuscular TID PRN Mills, Shnese E, NP       haloperidol  lactate (HALDOL ) injection 10 mg  10 mg Intramuscular TID PRN Moishe Bernadette BRAVO, NP       And   diphenhydrAMINE  (BENADRYL ) injection 50 mg  50 mg Intramuscular TID PRN Moishe Bernadette BRAVO, NP       And   LORazepam  (ATIVAN ) injection 2 mg  2 mg Intramuscular TID PRN Moishe Bernadette BRAVO, NP       [START ON 06/30/2024] FLUoxetine  (PROZAC ) capsule 20 mg  20 mg Oral Daily Lynnette Barter, MD       hydrOXYzine  (ATARAX ) tablet 25 mg  25 mg Oral TID PRN Mills, Shnese E, NP   25 mg at 06/28/24 2149   magnesium  hydroxide (MILK OF MAGNESIA) suspension 30 mL  30 mL Oral Daily PRN  Mills, Shnese E, NP       melatonin tablet 5 mg  5 mg Oral QHS PRN Bobbitt, Shalon E, NP       nicotine  polacrilex (NICORETTE ) gum 2 mg  2 mg Oral PRN Pashayan, Alexander S, DO   2 mg at 06/28/24 8151   PTA Medications: Medications Prior to Admission  Medication Sig Dispense Refill Last Dose/Taking   amLODipine  (NORVASC ) 5 MG tablet Take 1 tablet (5 mg total) by mouth daily. (Patient not taking: Reported on 06/27/2024) 30 tablet 0    OLANZapine  zydis (ZYPREXA ) 5 MG disintegrating tablet Take 1 tablet (5 mg total) by mouth at bedtime. (Patient not taking: Reported on 06/27/2024) 30 tablet 0    sertraline  (ZOLOFT ) 50 MG tablet Take 1 tablet (50 mg total) by mouth daily. (Patient not taking: Reported on 06/27/2024) 30 tablet 0     Patient Stressors: Medication change or noncompliance   Traumatic event    Patient Strengths: Average or above average intelligence  Capable of independent living  Communication skills  Motivation for treatment/growth  Physical Health  Supportive family/friends   Treatment Modalities: Medication Management, Group therapy, Case management,  1  to 1 session with clinician, Psychoeducation, Recreational therapy.   Physician Treatment Plan for Primary Diagnosis: MDD (major depressive disorder), recurrent severe, without psychosis (HCC) Long Term Goal(s):     Short Term Goals:    Medication Management: Evaluate patient's response, side effects, and tolerance of medication regimen.  Therapeutic Interventions: 1 to 1 sessions, Unit Group sessions and Medication administration.  Evaluation of Outcomes: Not Progressing  Physician Treatment Plan for Secondary Diagnosis: Principal Problem:   MDD (major depressive disorder), recurrent severe, without psychosis (HCC) Active Problems:   Moderate cannabis use disorder (HCC)   Cannabis-induced mood disorder (HCC)   Grief  Long Term Goal(s):     Short Term Goals:       Medication Management: Evaluate patient's  response, side effects, and tolerance of medication regimen.  Therapeutic Interventions: 1 to 1 sessions, Unit Group sessions and Medication administration.  Evaluation of Outcomes: Not Progressing   RN Treatment Plan for Primary Diagnosis: MDD (major depressive disorder), recurrent severe, without psychosis (HCC) Long Term Goal(s): Knowledge of disease and therapeutic regimen to maintain health will improve  Short Term Goals: Ability to remain free from injury will improve, Ability to verbalize frustration and anger appropriately will improve, Ability to demonstrate self-control, Ability to participate in decision making will improve, Ability to verbalize feelings will improve, Ability to disclose and discuss suicidal ideas, Ability to identify and develop effective coping behaviors will improve, and Compliance with prescribed medications will improve  Medication Management: RN will administer medications as ordered by provider, will assess and evaluate patient's response and provide education to patient for prescribed medication. RN will report any adverse and/or side effects to prescribing provider.  Therapeutic Interventions: 1 on 1 counseling sessions, Psychoeducation, Medication administration, Evaluate responses to treatment, Monitor vital signs and CBGs as ordered, Perform/monitor CIWA, COWS, AIMS and Fall Risk screenings as ordered, Perform wound care treatments as ordered.  Evaluation of Outcomes: Not Progressing   LCSW Treatment Plan for Primary Diagnosis: MDD (major depressive disorder), recurrent severe, without psychosis (HCC) Long Term Goal(s): Safe transition to appropriate next level of care at discharge, Engage patient in therapeutic group addressing interpersonal concerns.  Short Term Goals: Engage patient in aftercare planning with referrals and resources, Increase social support, Increase ability to appropriately verbalize feelings, Increase emotional regulation, Facilitate  acceptance of mental health diagnosis and concerns, Facilitate patient progression through stages of change regarding substance use diagnoses and concerns, Identify triggers associated with mental health/substance abuse issues, and Increase skills for wellness and recovery  Therapeutic Interventions: Assess for all discharge needs, 1 to 1 time with Social worker, Explore available resources and support systems, Assess for adequacy in community support network, Educate family and significant other(s) on suicide prevention, Complete Psychosocial Assessment, Interpersonal group therapy.  Evaluation of Outcomes: Not Progressing   Progress in Treatment: Attending groups: Yes. Participating in groups: Yes. Taking medication as prescribed: Yes. Toleration medication: Yes. Family/Significant other contact made: No, will contact:  mother, Mickel Mayer [phone] Patient understands diagnosis: Yes. Discussing patient identified problems/goals with staff: Yes. Medical problems stabilized or resolved: Yes. Denies suicidal/homicidal ideation: Yes. Issues/concerns per patient self-inventory: No. Other: none reported  New problem(s) identified: No, Describe:  none rpeorted  New Short Term/Long Term Goal(s):  Patient Goals:   I have lost a lot of people in my life, a lot of people have died and people have bee telling me I need to talk about it  Discharge Plan or Barriers:   Reason for Continuation of Hospitalization: Anxiety  Depression Suicidal ideation  Estimated Length of Stay: 5-7 days  Last 3 [place] Suicide Severity Risk Score: Flowsheet Row Ad[facility] (Current) [facility] from 06/26/2024 in [facility] at [facility] ED from 01/13/2024 in [facility] Department at [facility]  C-SSRS RISK CATEGORY No Risk No Risk No Risk    Last [facility] 2/9 Scores:    04/09/2022    6:26 AM   Depression screen PHQ 2/9  Decreased Interest 1  Down, Depressed, Hopeless 2  PHQ - 2 Score 3  Altered sleeping 2  Tired, decreased energy 1  Change in appetite 3  Feeling bad or failure about yourself  1  Trouble concentrating 1  Moving slowly or fidgety/restless 0  Suicidal thoughts 1  PHQ-9 Score 12  Difficult doing work/chores Somewhat difficult    Scribe for Treatment Team: Benjaman Donia JONELLE ISRAEL 06/29/2024 12:36 PM

## 2024-06-29 NOTE — Progress Notes (Signed)
   06/29/24 0816  Psych Admission Type (Psych Patients Only)  Admission Status Voluntary  Psychosocial Assessment  Patient Complaints Sleep disturbance  Eye Contact Fair  Facial Expression Flat  Affect Appropriate to circumstance  Speech Logical/coherent  Interaction Assertive  Motor Activity Other (Comment) (WDL)  Appearance/Hygiene Unremarkable  Behavior Characteristics Cooperative  Mood Pleasant  Thought Process  Coherency WDL  Content WDL  Delusions None reported or observed  Perception WDL  Hallucination None reported or observed  Judgment Poor  Confusion None  Danger to Self  Current suicidal ideation? Denies  Agreement Not to Harm Self Yes  Description of Agreement verbal  Danger to Others  Danger to Others None reported or observed

## 2024-06-29 NOTE — Group Note (Signed)
 Recreation Therapy Group Note   Group Topic:Communication  Group Date: 06/29/2024 Start Time: 0930 End Time: 1015 Facilitators: Amanii Snethen-McCall, LRT,CTRS Location: [address] Dayroom   Group Topic: Communication, Problem Solving   Goal Area(s) Addresses:  Patient will effectively listen to complete activity.  Patient will identify communication skills used to make activity successful.  Patient will identify how skills used during activity can be used to reach post d/c goals.    Behavioral Response: Engaged   Intervention: Building surveyor Activity - Geometric pattern cards, pencils, blank paper    Activity: Geometric Drawings.  Three volunteers from the peer group will be shown an abstract picture with a particular arrangement of geometrical shapes.  Each round, one 'speaker' will describe the pattern, as accurately as possible without revealing the image to the group.  The remaining group members will listen and draw the picture to reflect how it is described to them. Patients with the role of 'listener' cannot ask clarifying questions but, may request that the speaker repeat a direction. Once the drawings are complete, the presenter will show the rest of the group the picture and compare how close each person came to drawing the picture. LRT will facilitate a post-activity discussion regarding effective communication and the importance of planning, listening, and asking for clarification in daily interactions with others.  Education: Environmental consultant, Active listening, Support systems, Discharge planning  Education Outcome: Acknowledges understanding/In group clarification offered/Needs additional education.    Affect/Mood: Appropriate   Participation Level: Engaged   Participation Quality: Independent   Behavior: Appropriate   Speech/Thought Process: Focused   Insight: Good   Judgement: Good   Modes of Intervention: Communication   Patient Response to  Interventions:  Engaged   Education Outcome:  In group clarification offered    Clinical Observations/Individualized Feedback: Pt was focused and attentive during group session. Pt showed some confusion while drawing the pictures presented in group. Pt also presented a drawing as well. Pt would show some uncertainty while presenting as well.     Plan: Continue to engage patient in RT group sessions 2-3x/week.   Massie Mees-McCall, LRT,CTRS 06/29/2024 12:05 PM

## 2024-06-29 NOTE — BHH Group Notes (Signed)
 Spirituality Group   Group Goal: Support / Education around grief and loss    Group Description: Following introductions and group rules, group members engaged in facilitated group dialog and support around topic of loss, with particular support around experiences of loss in their lives. Group members identified types of loss (relationships / self / things) as well as patterns, circumstances, and changes that precipitate loss. Reflection invited on thoughts / feelings around loss, normalized grief responses, and recognized variety in grief experience. Group noted Worden's four tasks of grief in discussion. Group drew on Adlerian / Rogerian, narrative, MI, with Yalom's group therapy as a primary framework.   Observations: Gabriel Nelson was an active participant in the group discussion. He shared with openness and showed empathy for peers.  Kayo Zion L. Fredrica, M.Div [phone]

## 2024-06-29 NOTE — Progress Notes (Signed)
(  Sleep Hours) - 6  (Any PRNs that were needed, meds refused, or side effects to meds)-  Vistaril  25 mg - effective  (Any disturbances and when (visitation, over night)- None  (Concerns raised by the patient)- None bp 148/93 HR 48  (SI/HI/AVH)- Denies

## 2024-06-29 NOTE — Group Note (Signed)
 Date:  06/29/2024 Time:  9:33 AM  Group Topic/Focus:  Goals Group:   The focus of this group is to help patients establish daily goals to achieve during treatment and discuss how the patient can incorporate goal setting into their daily lives to aide in recovery. Orientation:   The focus of this group is to educate the patient on the purpose and policies of crisis stabilization and provide a format to answer questions about their admission.  The group details unit policies and expectations of patients while admitted.    Participation Level:  Active  Participation Quality:  Appropriate  Affect:  Appropriate  Cognitive:  Appropriate  Insight: Appropriate  Engagement in Group:  Engaged  Modes of Intervention:  Discussion  Additional Comments:  Pt goal: Talk about discharge date with doctor  Adriana GORMAN Blush 06/29/2024, 9:33 AM

## 2024-06-29 NOTE — Group Note (Signed)
 Occupational Therapy Group Note  Group Topic: Sleep Hygiene  Group Date: 06/29/2024 Start Time: 1430 End Time: 1503 Facilitators: Dot [place] MATSU, OT   Group Description: Group encouraged increased participation and engagement through topic focused on sleep hygiene. Patients reflected on the quality of sleep they typically receive and identified areas that need improvement. Group was given background information on sleep and sleep hygiene, including common sleep disorders. Group members also received information on how to improve one's sleep and introduced a sleep diary as a tool that can be utilized to track sleep quality over a length of time. Group session ended with patients identifying one or more strategies they could utilize or implement into their sleep routine in order to improve overall sleep quality.        Therapeutic Goal(s):  Identify one or more strategies to improve overall sleep hygiene  Identify one or more areas of sleep that are negatively impacted (sleep too much, too little, etc)     Participation Level: Engaged   Participation Quality: Independent   Behavior: Appropriate   Speech/Thought Process: Relevant   Affect/Mood: Appropriate   Insight: Fair   Judgement: Fair      Modes of Intervention: Education  Patient Response to Interventions:  Attentive   Plan: Continue to engage patient in OT groups 2 - 3x/week.  06/29/2024  [place] MATSU Dot, OT   Gabriel Nelson, OT

## 2024-06-29 NOTE — BHH Group Notes (Signed)
 Adult Psychoeducational Group Note  Date:  06/29/2024 Time:  10:52 PM  Group Topic/Focus:  Wrap-Up Group:   The focus of this group is to help patients review their daily goal of treatment and discuss progress on daily workbooks.  Participation Level:  Active  Participation Quality:  Appropriate  Affect:  Appropriate  Cognitive:  Appropriate  Insight: Appropriate  Engagement in Group:  Engaged  Modes of Intervention:  Discussion  Additional Comments:  Pt. Attended group.  Drue Pouch 06/29/2024, 10:52 PM

## 2024-06-30 DIAGNOSIS — F4321 Adjustment disorder with depressed mood: Secondary | ICD-10-CM | POA: Diagnosis not present

## 2024-06-30 DIAGNOSIS — F12288 Cannabis dependence with other cannabis-induced disorder: Secondary | ICD-10-CM | POA: Diagnosis not present

## 2024-06-30 DIAGNOSIS — F332 Major depressive disorder, recurrent severe without psychotic features: Secondary | ICD-10-CM | POA: Diagnosis not present

## 2024-06-30 LAB — HEMOGLOBIN A1C
Hgb A1c MFr Bld: 4.7 % — ABNORMAL LOW (ref 4.8–5.6)
Mean Plasma Glucose: 88 mg/dL

## 2024-06-30 NOTE — Progress Notes (Signed)
   06/29/24 2300  Psych Admission Type (Psych Patients Only)  Admission Status Voluntary  Psychosocial Assessment  Patient Complaints Anxiety  Eye Contact Fair  Facial Expression Flat  Affect Appropriate to circumstance  Speech Logical/coherent  Interaction Assertive  Motor Activity Other (Comment) (WNL)  Appearance/Hygiene Unremarkable  Behavior Characteristics Cooperative  Mood Pleasant;Anxious  Thought Process  Coherency WDL  Content WDL  Delusions None reported or observed  Perception WDL  Hallucination None reported or observed  Judgment Poor  Confusion None  Danger to Self  Current suicidal ideation? Denies  Agreement Not to Harm Self Yes  Description of Agreement Verbal  Danger to Others  Danger to Others None reported or observed

## 2024-06-30 NOTE — Group Note (Signed)
 LCSW Group Therapy Note   Group Date: 06/30/2024 Start Time: 1100 End Time: 1200   Participation:  patient was present and actively participated in the discussion.  Type of Therapy:  Group Therapy   Topic:  Money Matters: Creating Stability, Confidence and Peace of Mind  Objective: To help participants understand the impact of financial stability on well-being through the lens of Maslow's Hierarchy of Needs and develop practical strategies for budgeting, saving, and debt repayment.  Goals: Increase awareness of spending habits and financial priorities, recognizing how money supports basic needs, security, and relationships. Develop simple budgeting and saving strategies to enhance stability and peace of mind.  Reduce financial stress by creating a realistic debt repayment plan, supporting long-term confidence and well-being.  Summary:  Participants explored how financial stability connects to basic needs, relationships, and self-esteem using Maslow's Hierarchy. They discussed budgeting, saving, and debt repayment strategies, identifying small, manageable changes. Through interactive discussion and self-reflection, they gained insight into their financial habits and created personal action steps for improvement.  Therapeutic Modalities Used: Elements of Cognitive Behavioral Therapy (CBT) - Addressing financial stress and thought patterns. Psychoeducation - Engineer, agricultural. Elements of Motivational Interviewing (MI) - Encouraging realistic, achievable changes. Group Support - Reducing shame and stress through shared experiences.   Gabriel Nelson O Gabriel Nelson, LCSWA 06/30/2024  12:43 PM

## 2024-06-30 NOTE — Group Note (Incomplete)
 Date:  06/30/2024 Time:  8:53 AM  Group Topic/Focus:       Participation Level:  {BHH PARTICIPATION OZCZO:77735}  Participation Quality:  {BHH PARTICIPATION QUALITY:22265}  Affect:  {BHH AFFECT:22266}  Cognitive:  {BHH COGNITIVE:22267}  Insight: {BHH Insight2:20797}  Engagement in Group:  {BHH ENGAGEMENT IN HMNLE:77731}  Modes of Intervention:  {BHH MODES OF INTERVENTION:22269}  Additional Comments:  ***  Gabriel Nelson 06/30/2024, 8:53 AM

## 2024-06-30 NOTE — Plan of Care (Signed)
   Problem: Education: Goal: Emotional status will improve Outcome: Progressing Goal: Mental status will improve Outcome: Progressing Goal: Verbalization of understanding the information provided will improve Outcome: Progressing

## 2024-06-30 NOTE — Progress Notes (Signed)
 [facility] MD Progress Note  06/30/2024 5:18 PM Gabriel Nelson  MRN:  [id number]  ID: 23 year old male with adjustment disorder, substance abuse, presenting to the ED with worsening depression. Family reports he has seemed more down than usual, he is coming up on the anniversary of his ex-girlfriend's death. Patient admits this has been hard for him. He states he has been very sad but does not give much further detail than this. Family expressed concerns that patient might hurt himself, however has not expressed any plan to do so. He tells me he has been taking medications but family reports he has no    Subjective:    Patient reports improving mood and has been attending groups. Patient reports I'm doing great. States he slept 6 hours and would like to try trazodone  as melatonin was not helpful.. HE denies SI, HI or AVH. Patient is more forthcoming today. Denies hopeless and states I have a lot to live for.  Patient denies wanting to be dead. Denies any thoughts of self harm. Patient reports benefit from Prozac  and denies side effects.   Social worker contacted patient's mother [phone]) for collateral and denied any acute safety concerns with patient returning home tomorrow. Denies access to guns or weapons.  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/02/2018   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      Social History   Substance and Sexual Activity  Drug Use Yes   Types: Marijuana   Comment: Daily. Last used: yesterday     Social History   Socioeconomic History   Marital status: Single    Spouse name: Not on file   Number of children: Not on file   Years of education: Not on file   Highest education level: Not on file  Occupational History   Not on file  Tobacco Use   Smoking status: Every Day    Current packs/day: 1.00    Average packs/day: 1 pack/day for 4.0 years (4.0 ttl pk-yrs)    Types: Cigarettes, Cigars   Smokeless tobacco: Never  Vaping Use   Vaping status: Every Day   Substances: Nicotine   Substance and Sexual Activity   Alcohol use: Yes    Comment: He states occassionally, reports last beer was when his brother died.    Drug use: Yes  Types: Marijuana    Comment: Daily. Last used: yesterday    Sexual activity: Yes    Comment: uta, patient not fully oriented  Other Topics Concern   Not on file  Social History Narrative   Not on file   Social Drivers of Health   Financial Resource Strain: Not on file  Food Insecurity: No Food Insecurity (06/27/2024)   Hunger Vital Sign    Worried About Running Out of Food in the Last Year: Never true    Ran Out of Food in the Last Year: Never true  Transportation Needs: No Transportation Needs (06/27/2024)   PRAPARE - Therapist, art (Medical): No    Lack of Transportation (Non-Medical): No  Physical Activity: Not on file  Stress: Not on file  Social Connections: Not on file   Additional Social History:                         Sleep: Fair Estimated Sleeping Duration (Last 24 Hours): 5.75-7.75 hours  Appetite:  Fair  Current Medications: Current Facility-Administered Medications  Medication Dose Route Frequency Provider Last Rate Last Admin   acetaminophen  (TYLENOL ) tablet 650 mg  650 mg Oral Q6H PRN Mills, Shnese E, NP       alum & mag hydroxide-simeth (MAALOX/MYLANTA) 200-200-20 MG/5ML suspension 30 mL  30 mL Oral Q4H PRN Mills, Shnese E, NP       amLODipine  (NORVASC ) tablet 5 mg  5 mg Oral Daily Mills, Shnese E, NP   5 mg at 06/30/24 9171   cloNIDine  (CATAPRES ) tablet 0.1 mg  0.1 mg Oral Q8H PRN Jaiveon Suppes, MD   0.1 mg at 06/30/24 9365   haloperidol  (HALDOL ) tablet 5 mg  5 mg Oral TID PRN Moishe Bernadette BRAVO, NP       And   diphenhydrAMINE  (BENADRYL ) capsule 50 mg  50 mg Oral TID PRN Mills, Shnese E, NP       haloperidol  lactate (HALDOL ) injection 5 mg  5 mg Intramuscular TID PRN Moishe Bernadette BRAVO, NP       And   diphenhydrAMINE  (BENADRYL ) injection 50 mg  50 mg Intramuscular TID PRN Moishe Bernadette BRAVO, NP       And   LORazepam  (ATIVAN ) injection 2 mg  2 mg Intramuscular TID PRN Mills, Shnese E, NP       haloperidol  lactate (HALDOL ) injection 10 mg  10 mg Intramuscular TID PRN Moishe Bernadette BRAVO, NP       And   diphenhydrAMINE  (BENADRYL ) injection 50 mg  50 mg Intramuscular TID PRN Moishe Bernadette E, NP       And   LORazepam  (ATIVAN ) injection 2 mg  2 mg Intramuscular TID PRN Mills, Shnese E, NP       FLUoxetine  (PROZAC ) capsule 20 mg  20 mg Oral Daily Lynnette Barter, MD   20 mg at 06/30/24 9171   hydrOXYzine  (ATARAX ) tablet 25 mg  25 mg Oral TID PRN Mills, Shnese E, NP   25 mg at 06/28/24 2149   magnesium  hydroxide (MILK OF MAGNESIA) suspension 30 mL  30 mL Oral Daily PRN Mills,  Shnese E, NP       melatonin tablet 5 mg  5 mg Oral QHS PRN Bobbitt, Shalon E, NP   5 mg at 06/29/24 2114   nicotine  polacrilex (NICORETTE ) gum 2 mg  2 mg Oral PRN Pashayan, Alexander S, DO   2 mg at 06/30/24 [phone]  Lab Results:  Results for orders placed or performed during the hospital encounter of 06/27/24 (from the past 48 hours)  TSH     Status: None   Collection Time: 06/29/24  6:28 AM  Result Value Ref Range   TSH 1.664 0.350 - 4.500 uIU/mL    Comment: Performed by a 3rd Generation assay with a functional sensitivity of <=0.01 uIU/mL. Performed at [facility], [address], [place], [place] [address]   Hemoglobin A1c     Status: Abnormal   Collection Time: 06/29/24  6:28 AM  Result Value Ref Range   Hgb A1c MFr Bld 4.7 (L) 4.8 - 5.6 %    Comment: (NOTE)         Prediabetes: 5.7 - 6.4         Diabetes: >6.4         Glycemic control for adults with diabetes: <7.0    Mean Plasma Glucose 88 mg/dL    Comment: (NOTE) Performed At: [facility] [address] [place], [place] [id number] Jennette Shorter MD Ey:[id number]   Lipid panel     Status: Abnormal   Collection Time: 06/29/24  6:28 AM  Result Value Ref Range   Cholesterol 171 0 - 200 mg/dL   Triglycerides 69 <849 mg/dL   HDL 39 (L) >59 mg/dL   Total CHOL/HDL Ratio 4.4 RATIO   VLDL 14 0 - 40 mg/dL   LDL Cholesterol 881 (H) 0 - 99 mg/dL    Comment:        Total Cholesterol/HDL:CHD Risk Coronary Heart Disease Risk Table                     Men   Women  1/2 Average Risk   3.4   3.3  Average Risk       5.0   4.4  2 X Average Risk   9.6   7.1  3 X Average Risk  23.4   11.0        Use the calculated Patient Ratio above and the CHD Risk Table to determine the patient's CHD Risk.        ATP III CLASSIFICATION (LDL):  <100     mg/dL   Optimal  899-870  mg/dL   Near or Above                    Optimal  130-159  mg/dL   Borderline  839-810  mg/dL   High  >809     mg/dL   Very  High Performed at [facility], [address] [place], [place] [address]   VITAMIN D  25 Hydroxy (Vit-D Deficiency, Fractures)     Status: None   Collection Time: 06/29/24  6:28 AM  Result Value Ref Range   Vit D, 25-Hydroxy 32.30 30 - 100 ng/mL    Comment: (NOTE) Vitamin D  deficiency has been defined by the Institute of Medicine  and an Endocrine Society practice guideline as a level of serum 25-OH  vitamin D  less than 20 ng/mL (1,2). The Endocrine Society went on to  further define vitamin D  insufficiency as a level between 21 and 29  ng/mL (2).  1. IOM (Institute of Medicine). 2010. Dietary reference intakes for  calcium and D. [place]: [employer]. 2. Holick MF, Binkley Denton, Bischoff-Ferrari HA, et al. Evaluation,  treatment, and prevention of vitamin D  deficiency: an Endocrine  Society clinical practice guideline, JCEM. 2011 Jul; 96(7):  1911-30.  Performed at [facility], [address], [place], [place] [address]     Blood Alcohol level:  Lab Results  Component Value Date   [facility] <15 06/26/2024   ETH <10 01/13/2024    Metabolic Disorder Labs: Lab Results  Component Value Date   HGBA1C 4.7 (L) 06/29/2024   MPG 88 06/29/2024   MPG 85.32 04/06/2020   No results found for: PROLACTIN Lab Results  Component Value Date   CHOL 171 06/29/2024   TRIG 69 06/29/2024   HDL 39 (L) 06/29/2024   CHOLHDL 4.4 06/29/2024   VLDL 14 06/29/2024   LDLCALC 118 (H) 06/29/2024   LDLCALC 89 04/06/2020    Physical Findings: AIMS:  ,  ,  ,  ,  ,  ,   CIWA:    COWS:     Musculoskeletal: Strength & Muscle Tone: within normal limits Gait & Station: normal Patient leans: N/A  Psychiatric Specialty Exam:  Presentation  General Appearance:  Appropriate for Environment  Eye Contact: Fair  Speech: Slow  Speech Volume: Decreased  Handedness: Right   Mood and Affect  Mood: Depressed; Anxious;  Dysphoric  Affect: Congruent   Thought Process  Thought Processes: Goal Directed  Descriptions of Associations:Intact  Orientation:Full (Time, Place and Person)  Thought Content:Logical  History of Schizophrenia/Schizoaffective disorder:No  Duration of Psychotic Symptoms:denies Hallucinations:Hallucinations: None  Ideas of Reference:None  Suicidal Thoughts:Suicidal Thoughts: No  Homicidal Thoughts:Homicidal Thoughts: No   Sensorium  Memory: Immediate Fair  Judgment: Fair  Insight: Fair   Art therapist  Concentration: Fair  Attention Span: Fair  Recall: [employer] of Knowledge: Fair  Language: Fair   Psychomotor Activity  Psychomotor Activity: Psychomotor Activity: Normal   Assets  Assets: Communication Skills; Desire for Improvement; Financial Resources/Insurance; Physical Health; Social Support   Sleep  Sleep: Sleep: Fair    Physical Exam: Physical Exam ROS Blood pressure (!) 151/95, pulse 77, temperature 98.1 F (36.7 C), temperature source Oral, resp. rate 16, height 5' 10 (1.778 m), weight 100.5 kg, SpO2 99%. Body mass index is 31.8 kg/m.   Assessment: 8/3 assessment by DR. JI Patient symptoms consistent with MDD and cannabis use disorder.  Given history of medication nonadherence, will start fluoxetine  instead of sertraline .  Unclear purpose of olanzapine  at this time so we will discontinue this for now.  Would recommend patient attend [facility] IOP and other substance use groups to aid with substance use cessation.  Patient is presently contemplative regarding cessation of cannabis use.  Unclear reason for hypertension but may be genetic.  Will obtain TSH and vitamin D  to evaluate for other etiologies that may be contributing to ongoing symptoms of depression.  Patient is mildly bradycardic but was tachycardic in the ED.  Will continue to monitor.  8/4: patient is not very forthcoming on evaluation and presents depressed but  denies SI, HI or AVH. Does not appear to be responding to internal stimuli. States he does not like medications and has a difficult time opening up to other. Notes multiple lossess of loved ones. Denies wanting to be dead however. Denies prior manic episodes or psychosis. Denies prior suicide attempts. Will obtain collateral from mother who asked patient to go to the hospital.   8/5: improving mood and affect. More hopeful and future oriented. Attending groups. Denies SI, HI or AVH and plan for discharge to mother's tomorrow with outpatient followup. Tolerating increase of fluoxetine  will complete trial of trazodone  50 mg at bedtime-PRN for insomnia tomorrow.  Patient future oriented on returning to work.      PLAN: Safety and Monitoring:             -- Voluntary admission to inpatient psychiatric unit for safety, stabilization and treatment             -- Daily contact with patient to assess and evaluate symptoms and progress in treatment             -- Patient's case to be discussed in multi-disciplinary team meeting             -- Observation Level : q15 minute checks             -- Vital signs: q12 hours             -- Precautions: suicide, elopement, and assault   2. Psychiatric Problems #Major depressive disorder, recurrent episode, severe without psychotic features #Cannabis use disorder #Cannabis induced mood disorder #Grief #PTSD -Stopped sertraline  and olanzapine  - cont fluoxetine  20 mg -Advised cessation of THC/cannabis use   -PRNs: maalox, milk of magnesia, hydroxyzine , trazodone  -- As needed agitation protocol in-place   The risks/benefits/side-effects/alternatives to the above medication were discussed in detail with the patient and time was given for questions. The patient consents to medication trial. FDA black box warnings, if present, were discussed.   The patient is agreeable with the medication plan, as above. We will monitor the patient's response to pharmacologic  treatment, and adjust medications as necessary.   3. Medical Problems Hypertension - Continue amlodipine  5 mg     4. Routine and other pertinent labs:     Metabolism / endocrine: BMI: Body mass index is 31.8 kg/m. Prolactin: Recent Labs  No results found for: PROLACTIN   Lipid Panel: Recent Labs       Lab Results  Component Value Date    CHOL 131 04/06/2020    TRIG 34 04/06/2020    HDL 35 (L) 04/06/2020    CHOLHDL 3.7 04/06/2020    VLDL 7 04/06/2020    LDLCALC 89 04/06/2020      HbgA1c: Last Labs     Hgb A1c MFr Bld (%)  Date Value  04/06/2020 4.6 (L)      TSH: Last Labs     TSH (uIU/mL)  Date Value  04/06/2020 1.387        5. Group Therapy:             -- Encouraged patient to participate in unit milieu and in scheduled group therapies              -- Short Term Goals: Ability to identify changes in lifestyle to reduce recurrence of condition, verbalize feelings, identify and develop effective coping behaviors, maintain clinical measurements within normal limits, and identify triggers associated with substance abuse/mental health issues will improve. Improvement in ability to demonstrate self-control and comply with prescribed medications.             -- Long Term Goals: Improvement in symptoms so as ready for discharge -- Patient is encouraged to participate in group therapy while admitted to the psychiatric unit. -- We will address other chronic and acute stressors, which contributed to the patient's MDD (major depressive disorder), recurrent severe, without psychosis (HCC) in order to reduce the risk of self-harm at discharge.   6. Discharge Planning:              -- Social work and case management to assist with discharge planning and  identification of hospital follow-up needs prior to discharge             -- Estimated LOS: tomorrow             -- Discharge Concerns: Need to establish a safety plan; Medication compliance and effectiveness              -- Discharge Goals: Return home with outpatient referrals for mental health follow-up including medication management/psychotherapy   I certify that inpatient services furnished can reasonably be expected to improve the patient's condition.  Shemicka Cohrs, MD 06/30/2024, 5:18 PM

## 2024-06-30 NOTE — Progress Notes (Signed)
(  Sleep Hours) - 7.5 (Any PRNs that were needed, meds refused, or side effects to meds)- melatonin, no meds refused, no side effects to meds  (Any disturbances and when (visitation, over night)- N/A (Concerns raised by the patient)- N/A (SI/HI/AVH)- DENIES

## 2024-06-30 NOTE — Group Note (Signed)
 Note created on accident.  Gabriel Nelson 06/30/2024, 1:18 PM

## 2024-06-30 NOTE — [facility] Group Notes (Signed)
 [facility] Group Notes:  (Nursing/MHT/Case Management/Adjunct)  Date:  06/30/2024  Time:  2000  Type of Therapy:  Wrap up group  Participation Level:  Active  Participation Quality:  Appropriate, Attentive, Sharing, and Supportive  Affect:  Appropriate  Cognitive:  Alert  Insight:  Improving  Engagement in Group:  Engaged  Modes of Intervention:  Clarification, Education, and Support  Summary of Progress/Problems: Positive thinking and positive change were discussed.   Lenora Shaver S 06/30/2024, 8:30 PM

## 2024-06-30 NOTE — Progress Notes (Signed)
   06/30/24 2200  Psych Admission Type (Psych Patients Only)  Admission Status Voluntary  Psychosocial Assessment  Patient Complaints None  Eye Contact Fair  Facial Expression Flat  Affect Appropriate to circumstance  Speech Logical/coherent  Interaction Assertive  Motor Activity Other (Comment) (WNL)  Appearance/Hygiene Unremarkable  Behavior Characteristics Cooperative  Mood Threatening  Thought Process  Coherency WDL  Content WDL  Delusions None reported or observed  Perception WDL  Hallucination None reported or observed  Judgment Poor  Confusion None  Danger to Self  Current suicidal ideation? Denies  Agreement Not to Harm Self Yes  Description of Agreement Verbal  Danger to Others  Danger to Others None reported or observed

## 2024-06-30 NOTE — Progress Notes (Signed)
   06/30/24 0828  Psych Admission Type (Psych Patients Only)  Admission Status Voluntary  Psychosocial Assessment  Patient Complaints None  Eye Contact Fair  Facial Expression Flat  Affect Appropriate to circumstance  Speech Logical/coherent  Interaction Assertive  Motor Activity Other (Comment) (unremarkable)  Appearance/Hygiene Unremarkable  Behavior Characteristics Cooperative  Mood Pleasant  Thought Process  Coherency WDL  Content WDL  Delusions None reported or observed  Perception WDL  Hallucination None reported or observed  Judgment Poor  Confusion None  Danger to Self  Current suicidal ideation? Denies  Agreement Not to Harm Self Yes  Description of Agreement verbal  Danger to Others  Danger to Others None reported or observed

## 2024-06-30 NOTE — [facility] Suicide Risk Assessment (Signed)
 [facility] INPATIENT:  Family/Significant Other Suicide Prevention Education  Suicide Prevention Education:  Education Completed; mother, , Darolyn Mayer [phone]  (name of family member/significant other) has been identified by the patient the family member/significant other with whom the patient will be residing, and identified as the person(s) who will aid the patient in the event of a mental health crisis (suicidal ideations/suicide attempt).  With written consent from the patient, the family member/significant other has been provided the following suicide prevention education, prior to the and/or following the discharge of the patient.  CSW received permission to speak with mother who reported no safety concerns with him returning home. Mother reported no firearms in the home. Mother will secure sharps and medications.   The suicide prevention education provided includes the following: Suicide risk factors Suicide prevention and interventions National Suicide Hotline telephone number [facility] assessment telephone number [facility] Emergency Assistance 911 [facility] and/or Residential Mobile Crisis Unit telephone number  Request made of family/significant other to: Remove weapons (e.g., guns, rifles, knives), all items previously/currently identified as safety concern.   Remove drugs/medications (over-the-counter, prescriptions, illicit drugs), all items previously/currently identified as a safety concern.  The family member/significant other verbalizes understanding of the suicide prevention education information provided.  The family member/significant other agrees to remove the items of safety concern listed above.  Sevyn Paredez R 06/30/2024, 1:12 PM

## 2024-06-30 NOTE — Group Note (Signed)
 Recreation Therapy Group Note   Group Topic:Animal Assisted Therapy   Group Date: 06/30/2024 Start Time: 9049 End Time: 1028 Facilitators: Drea Jurewicz-McCall, LRT,CTRS Location: [address] Dayroom   Animal-Assisted Activity (AAA) Program Checklist/Progress Notes Patient Eligibility Criteria Checklist & Daily Group note for Rec Tx Intervention  AAA/T Program Assumption of Risk Form signed by Patient/ or Parent Legal Guardian Yes  Patient is free of allergies or severe asthma Yes  Patient reports no fear of animals Yes  Patient reports no history of cruelty to animals Yes  Patient understands his/her participation is voluntary Yes  Patient washes hands before animal contact Yes  Patient washes hands after animal contact Yes  Behavioral Response: Attentive   Education: Hand Washing, Appropriate Animal Interaction   Education Outcome: Acknowledges education.    Affect/Mood: Appropriate   Participation Level: Engaged   Participation Quality: Independent   Behavior: Attentive    Speech/Thought Process: Focused   Insight: Good   Judgement: Good   Modes of Intervention: Teaching laboratory technician   Patient Response to Interventions:  Engaged   Education Outcome:  In group clarification offered    Clinical Observations/Individualized Feedback: Pt was mostly quiet during group but would have some interaction from time to time. Pt was attentive and observant during group session.     Plan: Continue to engage patient in RT group sessions 2-3x/week.   Rhonna Holster-McCall, LRT,CTRS 06/30/2024 12:44 PM

## 2024-07-01 DIAGNOSIS — F332 Major depressive disorder, recurrent severe without psychotic features: Secondary | ICD-10-CM | POA: Diagnosis not present

## 2024-07-01 DIAGNOSIS — F4321 Adjustment disorder with depressed mood: Secondary | ICD-10-CM | POA: Diagnosis not present

## 2024-07-01 DIAGNOSIS — F12288 Cannabis dependence with other cannabis-induced disorder: Secondary | ICD-10-CM | POA: Diagnosis not present

## 2024-07-01 MED ORDER — FLUOXETINE HCL 20 MG PO CAPS: 20.0000 mg | ORAL_CAPSULE | Freq: Every day | ORAL | 0 refills | Status: AC

## 2024-07-01 MED ORDER — AMLODIPINE BESYLATE 5 MG PO TABS: 5.0000 mg | ORAL_TABLET | Freq: Every day | ORAL | 0 refills | Status: AC

## 2024-07-01 MED ORDER — MELATONIN 5 MG PO TABS: 5.0000 mg | ORAL_TABLET | Freq: Every evening | ORAL | Status: AC | PRN

## 2024-07-01 MED ORDER — NICOTINE POLACRILEX 2 MG MT GUM: 2.0000 mg | CHEWING_GUM | OROMUCOSAL | 0 refills | Status: AC | PRN

## 2024-07-01 MED ORDER — HYDROXYZINE HCL 25 MG PO TABS: 25.0000 mg | ORAL_TABLET | Freq: Three times a day (TID) | ORAL | 0 refills | Status: AC | PRN

## 2024-07-01 NOTE — Progress Notes (Signed)
 Patient ID: Gabriel Nelson, male   DOB: 10-07-2001, 23 y.o.   MRN: [id number]  Patient discharged off unit at 1203. Patient belongings reviewed and acknowledged by patient. AVS and Transition Record reviewed and acknowledged by patient. Safety plan completed by patient and copy provided. Patient denies SI/HI/AVH. No observed or reported side effects to medication. No observed or reported agitation, aggression, or other acute emotional distress. No observed or reported physical abnormalities or concerns. Patient transportation from facility verified and observed.

## 2024-07-01 NOTE — Plan of Care (Signed)
   Problem: Education: Goal: Emotional status will improve Outcome: Progressing Goal: Mental status will improve Outcome: Progressing

## 2024-07-01 NOTE — Discharge Summary (Signed)
 Physician Discharge Summary Note  Patient:  Gabriel Nelson is an 23 y.o., male MRN:  [id number] DOB:  04/11/2001 Patient phone:  [phone] (home)  Patient address:   [address] [place] [address],  Total Time spent with patient: 30 minutes  Date of Admission:  06/27/2024 Date of Discharge: 07/01/24  Reason for Admission:   As per H&P:  23 y.o., male with history of adjustment disorder and cannabis use disorder presenting from [facility], ED to [facility] due to worsening depression.  Current psychosocial stressor is anniversary of ex-girlfriend's death as well as friend passing away a few months ago.  Patient takes sertraline , has been with good response to this medication but there is concern for nonadherence to medications.   Patient is uncertain why he was recently struggling with worsening depression.  He reports that he does not notice when he has severe depression and is very insidious.  He does endorse depressed mood, anhedonia, sleep disturbance, poor concentration, guilt.  Denies ever experiencing any suicidal ideations.  Reports recently struggling with worsening depression and anxiety secondary to decreasing cannabis use.  He does feel a significant dependence on cannabis as he has had significant amount of time spent obtaining cannabis and using it.  He denies any other substance use with the exception of weekly drinking 2-3 shots of alcohol.   He denies history of mania or psychosis outside of cannabis use.  He endorses being laced with K2 in the past which has resulted in psychosis but denies ever experiencing psychotic symptoms outside of it.   He endorses history of trauma that has resulted in hypervigilance, negative alteration in mood, intrusive thoughts, flashbacks.  He denies nightmares.  He reports struggling with anxiety at times but denies panic attacks.   He reports struggling with hypertension and sometimes feels very warm to the touch.  He is uncertain what is  causing this but would like to establish care with a PCP to manage his medical health.  He also endorses history of blacking out although is unclear whether he was signs of seizures or some other etiology.  Endorses at times feeling as if there is a postictal component to it and it lasting a few minutes at a time..  Principal Problem: MDD (major depressive disorder), recurrent severe, without psychosis (HCC) Discharge Diagnoses: Principal Problem:   MDD (major depressive disorder), recurrent severe, without psychosis (HCC) Active Problems:   Moderate cannabis use disorder (HCC)   Cannabis-induced mood disorder (HCC)   Grief   Past Psychiatric History:  Previous psych diagnoses: cannabis use disorder, adjustment disorder Prior inpatient psychiatric treatment: 5x for AMS secondary to cannabis use Prior outpatient psychiatric treatment: denies Current psychiatric provider: none Current therapist:denies   History of suicide attempts: denies History of homicide: denies   Past Psychotropics: sertraline , olanzapine   Past Medical History:  Past Medical History:  Diagnosis Date   ADHD    Adjustment disorder with mixed anxiety and depressed mood 12/07/2022   Adjustment disorder with mixed disturbance of emotions and conduct 08/02/2018   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   Depression    Hypertension    Involuntary commitment    Marijuana intoxication (HCC) 10/27/2016   Medical history non-contributory    Osteomyelitis (HCC) 10/23/2015   Psychosis (HCC)    Rhabdomyolysis 10/27/2016    Past Surgical History:  Procedure Laterality Date   NO PAST SURGERIES     Family History:  Family History  Problem Relation Age of Onset  Healthy Mother    Family Psychiatric  History:       Family History  Problem Relation Age of Onset   Healthy Mother    Patient denies to me and denies history of suicide Social History:  Social History   Substance  and Sexual Activity  Alcohol Use Yes   Comment: He states occassionally, reports last beer was when his brother died.      Social History   Substance and Sexual Activity  Drug Use Yes   Types: Marijuana   Comment: Daily. Last used: yesterday     Social History   Socioeconomic History   Marital status: Single    Spouse name: Not on file   Number of children: Not on file   Years of education: Not on file   Highest education level: Not on file  Occupational History   Not on file  Tobacco Use   Smoking status: Every Day    Current packs/day: 1.00    Average packs/day: 1 pack/day for 4.0 years (4.0 ttl pk-yrs)    Types: Cigarettes, Cigars   Smokeless tobacco: Never  Vaping Use   Vaping status: Every Day   Substances: Nicotine   Substance and Sexual Activity   Alcohol use: Yes    Comment: He states occassionally, reports last beer was when his brother died.    Drug use: Yes    Types: Marijuana    Comment: Daily. Last used: yesterday    Sexual activity: Yes    Comment: uta, patient not fully oriented  Other Topics Concern   Not on file  Social History Narrative   Not on file   Social Drivers of Health   Financial Resource Strain: Not on file  Food Insecurity: No Food Insecurity (06/27/2024)   Hunger Vital Sign    Worried About Running Out of Food in the Last Year: Never true    Ran Out of Food in the Last Year: Never true  Transportation Needs: No Transportation Needs (06/27/2024)   PRAPARE - Administrator, Civil Service (Medical): No    Lack of Transportation (Non-Medical): No  Physical Activity: Not on file  Stress: Not on file  Social Connections: Not on file    Hospital Course:   Patient was admitted to inpatient psychiatry at [facility] for safety and stabilization. Patient was provided safe and therapeutic milieu, psychiatric and medical assessment, care and treatment, as well as support from nursing, behavioral health staff. Both psychotherapy  and psychoeducation groups were provided. Different coping skills such as journaling, CBT and art therapy groups were offered. Additional consultation was provided by hospitalist for H&P and medical needs.  Patient was started on fluoxetine  20 mg qdaily and hydroxyzine  25 mg TID-PRN during the admission for MDD. Patient tolerated without side effects and medications were titrated to therapeutic effect. As patient stabilized on medications and participated in therapeutic interventions, symptoms began to improve. Patient denied SI, HI or AVH during admission for >72 hours. Denies prior suicide attempts. HE was future oriented and interested in outpatient therapy and participate in groups. He was interactive with peers and euthymic and smiling for 2 days prior to discharge. Mother was contacted and denies any acute safety concerns with patient being discharged.  On the day of discharge, the chart was reviewed, case was discussed with staff and the patient was seen in person. Patient's overall mood has improved. Patient was calm and cooperative and did not appear anxious. Patient states he wants to live  for my mom and my family. Patient reported adequate sleep and stable mood. Patient was tolerating medications well without side effects reported or noted. Patient denied suicidal ideation, plan or intent, denied hopelessness, helplessness or worthlessness, and denied homicidal ideation. Insight and judgement have improved. Patient demonstrated future orientation and was motivated to follow-up with aftercare. Patient was encouraged to be adherent with medications. Patient was instructed to call 911, ask for help to go to the closest emergency room or crisis center, call crisis hotlines for help if in critical status or when symptoms were worsening. Patient voiced understanding of this information. At the time of discharge, patient had reached maximum benefit from hospitalization, was no longer considered to be  dangerous to self or others, and was psychiatrically stable and otherwise appropriate for discharge to less restrictive care in the community.   Medical Hospital Course: Patient was seen by the hospitalist for routine admission examination. Medications for chronic conditions were continued.  Medical hospital course was otherwise unremarkable.    Musculoskeletal: Strength & Muscle Tone: within normal limits Gait & Station: normal Patient leans: N/A   Psychiatric Specialty Exam:  Presentation  General Appearance:  Appropriate for Environment  Eye Contact: Fair  Speech: Clear and Coherent  Speech Volume: Normal  Handedness: Right   Mood and Affect  Mood: Euthymic  Affect: Appropriate   Thought Process  Thought Processes: Goal Directed  Descriptions of Associations:Intact  Orientation:Full (Time, Place and Person)  Thought Content:Logical  History of Schizophrenia/Schizoaffective disorder:No  Duration of Psychotic Symptoms:No data recorded Hallucinations:Hallucinations: None  Ideas of Reference:None  Suicidal Thoughts:Suicidal Thoughts: No  Homicidal Thoughts:Homicidal Thoughts: No   Sensorium  Memory: Immediate Fair  Judgment: Fair  Insight: Fair   Art therapist  Concentration: Fair  Attention Span: Fair  Recall: [employer] of Knowledge: Fair  Language: Fair   Psychomotor Activity  Psychomotor Activity: Psychomotor Activity: Normal   Assets  Assets: Communication Skills; Desire for Improvement; Social Support; Talents/Skills; Transportation; Housing   Sleep  Sleep: Sleep: Fair  Estimated Sleeping Duration (Last 24 Hours): 5.25-7.50 hours   Physical Exam:  General: Well developed, well nourished, [place] male Pupils: Normal at 3mm Respiratory: Breathing is unlabored.  Cardiovascular: No edema.  Language: No anomia, no aphasia Muscle strength and tone-pt moving all extremities.  Gait not  assessed as pt remained in bed.  Neuro: Facial muscles are symmetric. Pt without tremor, no evidence of hyperarousal.  Review of Systems  Constitutional: Negative.   HENT: Negative.    Eyes: Negative.   Respiratory: Negative.    Cardiovascular: Negative.   Gastrointestinal: Negative.   Genitourinary: Negative.   Musculoskeletal: Negative.   Skin: Negative.   Neurological: Negative.   Endo/Heme/Allergies: Negative.   Psychiatric/Behavioral: Negative.     Blood pressure (!) 143/100, pulse 70, temperature 98.2 F (36.8 C), temperature source Oral, resp. rate 16, height 5' 10 (1.778 m), weight 100.5 kg, SpO2 100%. Body mass index is 31.8 kg/m.   Social History   Tobacco Use  Smoking Status Every Day   Current packs/day: 1.00   Average packs/day: 1 pack/day for 4.0 years (4.0 ttl pk-yrs)   Types: Cigarettes, Cigars  Smokeless Tobacco Never   Tobacco Cessation:  A prescription for an FDA-approved tobacco cessation medication provided at discharge   Blood Alcohol level:  Lab Results  Component Value Date   [facility] <15 06/26/2024   ETH <10 01/13/2024    Metabolic Disorder Labs:  Lab Results  Component Value Date   HGBA1C  4.7 (L) 06/29/2024   MPG 88 06/29/2024   MPG 85.32 04/06/2020   No results found for: PROLACTIN Lab Results  Component Value Date   CHOL 171 06/29/2024   TRIG 69 06/29/2024   HDL 39 (L) 06/29/2024   CHOLHDL 4.4 06/29/2024   VLDL 14 06/29/2024   LDLCALC 118 (H) 06/29/2024   LDLCALC 89 04/06/2020    See Psychiatric Specialty Exam and Suicide Risk Assessment completed by Attending Physician prior to discharge.  Discharge destination:  Home  Is patient on multiple antipsychotic therapies at discharge:  No   Has Patient had three or more failed trials of antipsychotic monotherapy by history:  No  Recommended Plan for Multiple Antipsychotic Therapies: NA  Discharge Instructions     Increase activity slowly   Complete by: As directed        Allergies as of 07/01/2024   No Known Allergies      Medication List     STOP taking these medications    OLANZapine  zydis 5 MG disintegrating tablet Commonly known as: ZYPREXA    sertraline  50 MG tablet Commonly known as: ZOLOFT        TAKE these medications      Indication  amLODipine  5 MG tablet Commonly known as: NORVASC  Take 1 tablet (5 mg total) by mouth daily.  Indication: High Blood Pressure   FLUoxetine  20 MG capsule Commonly known as: PROZAC  Take 1 capsule (20 mg total) by mouth daily. Start taking on: July 02, 2024  Indication: Major Depressive Disorder   hydrOXYzine  25 MG tablet Commonly known as: ATARAX  Take 1 tablet (25 mg total) by mouth 3 (three) times daily as needed for anxiety.  Indication: Feeling Anxious   melatonin 5 MG Tabs Take 1 tablet (5 mg total) by mouth at bedtime as needed.  Indication: Trouble Sleeping   nicotine  polacrilex 2 MG gum Commonly known as: NICORETTE  Take 1 each (2 mg total) by mouth as needed for smoking cessation.  Indication: Nicotine  Addiction        Follow-up Information     [facility]. Go on 07/21/2024.   Specialty: Behavioral Health Why: You have an appointment for medication management services on 07/21/24 at 2:00 pm  .  The appointment will be held in person. Contact information: [address] [place]  [address] [phone]        [facility] Go on 07/07/2024.   Specialty: Professional Counselor Why: Please go to this provider on 07/07/24 at 9:00 am for an assessment, to obtain therapy services.  You may also go on Monday through Friday, from 9 am to 1 pm. Contact information: [address] [place]  [address] [place] [address] [phone]                 Follow-up recommendations:  outpatient psychiatric followup as above  Signed: Cambryn Charters, MD 07/01/2024, 10:31 AM

## 2024-07-01 NOTE — BHH Suicide Risk Assessment (Addendum)
 [facility] Discharge Suicide Risk Assessment   Principal Problem: MDD (major depressive disorder), recurrent severe, without psychosis (HCC) Discharge Diagnoses: Principal Problem:   MDD (major depressive disorder), recurrent severe, without psychosis (HCC) Active Problems:   Moderate cannabis use disorder (HCC)   Cannabis-induced mood disorder (HCC)   Grief   Total Time spent with patient: 30 minutes  Musculoskeletal: Strength & Muscle Tone: within normal limits Gait & Station: normal Patient leans: N/A  Psychiatric Specialty Exam  Presentation  General Appearance:  Appropriate for Environment  Eye Contact: Fair  Speech: Clear and Coherent  Speech Volume: Normal  Handedness: Right   Mood and Affect  Mood: Euthymic  Duration of Depression Symptoms: N/A  Affect: Appropriate   Thought Process  Thought Processes: Goal Directed  Descriptions of Associations:Intact  Orientation:Full (Time, Place and Person)  Thought Content:Logical  History of Schizophrenia/Schizoaffective disorder:No  Duration of Psychotic Symptoms:No data recorded Hallucinations:Hallucinations: None  Ideas of Reference:None  Suicidal Thoughts:Suicidal Thoughts: No  Homicidal Thoughts:Homicidal Thoughts: No   Sensorium  Memory: Immediate Fair  Judgment: Fair  Insight: Fair   Art therapist  Concentration: Fair  Attention Span: Fair  Recall: Fair  Fund of Knowledge: Fair  Language: Fair   Psychomotor Activity  Psychomotor Activity:Psychomotor Activity: Normal   Assets  Assets: Communication Skills; Desire for Improvement; Social Support; Talents/Skills; Transportation; Housing   Sleep  Sleep:Sleep: Fair  Estimated Sleeping Duration (Last 24 Hours): 5.25-7.50 hours  Physical Exam: Physical Exam ROS Blood pressure (!) 143/100, pulse 70, temperature 98.2 F (36.8 C), temperature source Oral, resp. rate 16, height 5' 10 (1.778 m), weight 100.5  kg, SpO2 100%. Body mass index is 31.8 kg/m.  Mental Status Per Nursing Assessment::   On Admission:  NA  Demographic Factors:  Male, Adolescent or young adult, and Low socioeconomic status  Loss Factors: Loss of significant relationship and Financial problems/change in socioeconomic status  Historical Factors: Anniversary of important loss  Risk Reduction Factors:   Sense of responsibility to family, Employed, Living with another person, especially a relative, Positive social support, Positive therapeutic relationship, and Positive coping skills or problem solving skills  Continued Clinical Symptoms:  Depression improved Cognitive Features That Contribute To Risk:  None    Suicide Risk:  Minimal: No identifiable suicidal ideation.  Patients presenting with no risk factors but with morbid ruminations; may be classified as minimal risk based on the severity of the depressive symptoms  Patient denies SI or HI for >48 hours. Denies wanting to be dead and future oriented. SRA complete and acute risk for suicide is low.   [place] Suicide Risk assessment:  1. Do you wish to be dead? NONE REPORTED 2. Have you wished your dead or wished you could go to sleep and not wake up? NONE REPORTED 3.  Have you actually had thoughts of killing yourself?  NONE REPORTED 4.  Have you been thinking about how you might do this?  NONE REPORTED 5.  Have you had these thoughts and some intention of acting on them? NONE REPORTED 6.  Have you started to work out or worked out the details to kill yourself? NONE REPORTED 7.  Do you intend to carry out this plan? NONE REPORTED 8. On a scale of 1-5 with 1 being the least severe and 5 being the most severe answer the following questions place for intensity of ideation. ZERO 9. How many times have you had these thoughts? NONE REPORTED 10. When you have the thoughts how long to the last?  NONE REPORTED 11. Control ability.  Could you or can you stop thinking  about killing herself or wanting to die if you want to?  YES 12. Are there any things anyone or anything family religion pain of death that stop you from wanting to die or acting on thoughts of committing suicide?  FAMILY 13.  What sort of reason to do have to think about wanting to die or killing yourself? NONE REPORTED 14.Was it to end the pain or stop the way you are feeling in other words you could not go on living with his pain or how you are feeling or was not to get attention revenge or reaction from others?  Or both?  NONE REPORTED  [place] Suicide Risk assessment:  1. Do you wish to be dead? NONE REPORTED 2. Have you wished your dead or wished you could go to sleep and not wake up? NONE REPORTED 3.  Have you actually had thoughts of killing yourself?  NONE REPORTED 4.  Have you been thinking about how you might do this?  NONE REPORTED 5.  Have you had these thoughts and some intention of acting on them? NONE REPORTED 6.  Have you started to work out or worked out the details to kill yourself? NONE REPORTED 7.  Do you intend to carry out this plan? NONE REPORTED 8. On a scale of 1-5 with 1 being the least severe and 5 being the most severe answer the following questions place for intensity of ideation. ZERO 9. How many times have you had these thoughts? NONE REPORTED 10. When you have the thoughts how long to the last?  NONE REPORTED 11. Control ability.  Could you or can you stop thinking about killing herself or wanting to die if you want to?  YES 12. Are there any things anyone or anything family religion pain of death that stop you from wanting to die or acting on thoughts of committing suicide?  my mom 60.  What sort of reason to do have to think about wanting to die or killing yourself? NONE REPORTED 14.Was it to end the pain or stop the way you are feeling in other words you could not go on living with his pain or how you are feeling or was not to get attention revenge or reaction  from others?  Or both?  NONE REPORTED    Follow-up Information     [facility]. Go on 07/21/2024.   Specialty: Behavioral Health Why: You have an appointment for medication management services on 07/21/24 at 2:00 pm  .  The appointment will be held in person. Contact information: [address] [place]  [phone]        [facility] Go on 07/07/2024.   Specialty: Professional Counselor Why: Please go to this provider on 07/07/24 at 9:00 am for an assessment, to obtain therapy services.  You may also go on Monday through Friday, from 9 am to 1 pm. Contact information: [address] [place]  [address] [place] [address] [phone]                 Plan Of Care/Follow-up recommendations:  Outpatient psychiatric and therapy followup as above  Monteen Toops, MD 07/01/2024, 10:43 AM

## 2024-07-01 NOTE — Plan of Care (Signed)
 Problem: Education: Goal: Knowledge of [facility] Education information/materials will improve Outcome: Adequate for Discharge Goal: Emotional status will improve Outcome: Adequate for Discharge Goal: Mental status will improve Outcome: Adequate for Discharge Goal: Verbalization of understanding the information provided will improve Outcome: Adequate for Discharge   Problem: Activity: Goal: Interest or engagement in activities will improve Outcome: Adequate for Discharge Goal: Sleeping patterns will improve Outcome: Adequate for Discharge   Problem: Coping: Goal: Ability to verbalize frustrations and anger appropriately will improve Outcome: Adequate for Discharge Goal: Ability to demonstrate self-control will improve Outcome: Adequate for Discharge   Problem: Health Behavior/Discharge Planning: Goal: Identification of resources available to assist in meeting health care needs will improve Outcome: Adequate for Discharge Goal: Compliance with treatment plan for underlying cause of condition will improve Outcome: Adequate for Discharge   Problem: Physical Regulation: Goal: Ability to maintain clinical measurements within normal limits will improve Outcome: Adequate for Discharge   Problem: Safety: Goal: Periods of time without injury will increase Outcome: Adequate for Discharge   Problem: Education: Goal: Knowledge of [facility] Education information/materials will improve Outcome: Adequate for Discharge Goal: Emotional status will improve Outcome: Adequate for Discharge Goal: Mental status will improve Outcome: Adequate for Discharge Goal: Verbalization of understanding the information provided will improve Outcome: Adequate for Discharge   Problem: Activity: Goal: Interest or engagement in activities will improve Outcome: Adequate for Discharge Goal: Sleeping patterns will improve Outcome: Adequate for Discharge   Problem: Coping: Goal:  Ability to verbalize frustrations and anger appropriately will improve Outcome: Adequate for Discharge Goal: Ability to demonstrate self-control will improve Outcome: Adequate for Discharge   Problem: Health Behavior/Discharge Planning: Goal: Identification of resources available to assist in meeting health care needs will improve Outcome: Adequate for Discharge Goal: Compliance with treatment plan for underlying cause of condition will improve Outcome: Adequate for Discharge   Problem: Physical Regulation: Goal: Ability to maintain clinical measurements within normal limits will improve Outcome: Adequate for Discharge   Problem: Safety: Goal: Periods of time without injury will increase Outcome: Adequate for Discharge   Problem: Activity: Goal: Will identify at least one activity in which they can participate Outcome: Adequate for Discharge   Problem: Coping: Goal: Ability to identify and develop effective coping behavior will improve Outcome: Adequate for Discharge Goal: Ability to interact with others will improve Outcome: Adequate for Discharge Goal: Demonstration of participation in decision-making regarding own care will improve Outcome: Adequate for Discharge Goal: Ability to use eye contact when communicating with others will improve Outcome: Adequate for Discharge   Problem: Health Behavior/Discharge Planning: Goal: Identification of resources available to assist in meeting health care needs will improve Outcome: Adequate for Discharge   Problem: Self-Concept: Goal: Will verbalize positive feelings about self Outcome: Adequate for Discharge   Problem: Education: Goal: Utilization of techniques to improve thought processes will improve Outcome: Adequate for Discharge Goal: Knowledge of the prescribed therapeutic regimen will improve Outcome: Adequate for Discharge   Problem: Activity: Goal: Interest or engagement in leisure activities will improve Outcome:  Adequate for Discharge Goal: Imbalance in normal sleep/wake cycle will improve Outcome: Adequate for Discharge   Problem: Coping: Goal: Coping ability will improve Outcome: Adequate for Discharge Goal: Will verbalize feelings Outcome: Adequate for Discharge   Problem: Health Behavior/Discharge Planning: Goal: Ability to make decisions will improve Outcome: Adequate for Discharge Goal: Compliance with therapeutic regimen will improve Outcome: Adequate for Discharge   Problem: Role Relationship: Goal: Will demonstrate positive changes in social  behaviors and relationships Outcome: Adequate for Discharge   Problem: Safety: Goal: Ability to disclose and discuss suicidal ideas will improve Outcome: Adequate for Discharge Goal: Ability to identify and utilize support systems that promote safety will improve Outcome: Adequate for Discharge   Problem: Self-Concept: Goal: Will verbalize positive feelings about self Outcome: Adequate for Discharge Goal: Level of anxiety will decrease Outcome: Adequate for Discharge

## 2024-07-01 NOTE — Group Note (Signed)
 Date:  07/01/2024 Time:  9:39 AM  Group Topic/Focus:  Goals Group:   The focus of this group is to help patients establish daily goals to achieve during treatment and discuss how the patient can incorporate goal setting into their daily lives to aide in recovery. Orientation:   The focus of this group is to educate the patient on the purpose and policies of crisis stabilization and provide a format to answer questions about their admission.  The group details unit policies and expectations of patients while admitted.    Participation Level:  Did Not Attend  Additional Comments:   Group was announced pt chose not to attend.  Adriana GORMAN Blush 07/01/2024, 9:39 AM

## 2024-07-01 NOTE — Progress Notes (Signed)
(  Sleep Hours) - 7.75 (Any PRNs that were needed, meds refused, or side effects to meds)- no prns, no meds refused, no side effects to meds  (Any disturbances and when (visitation, over night)- n/a (Concerns raised by the patient)- n/a (SI/HI/AVH)- denies

## 2024-07-01 NOTE — Progress Notes (Signed)
  [facility] Adult Case Management Discharge Plan :  Will you be returning to the same living situation after discharge:  Yes,  pt will be returning home with mother Darolyn Mayer [phone]      At discharge, do you have transportation home?: Yes,  pt will be transported by mother Do you have the ability to pay for your medications: Yes,  pt has active medical coverage  Release of information consent forms completed and in the chart;  Patient's signature needed at discharge.  Patient to Follow up at:  Follow-up Information     [facility]. Go on 07/21/2024.   Specialty: Behavioral Health Why: You have an appointment for medication management services on 07/21/24 at 2:00 pm  .  The appointment will be held in person. Contact information: [phone] [place]  [phone]        [facility]. Go on 07/07/2024.   Specialty: Professional Counselor Why: Please go to this provider on 07/07/24 at 9:00 am for an assessment, to obtain therapy services.  You may also go on Monday through Friday, from 9 am to 1 pm. Contact information: [address] [place]  [address] [place] [address] [phone]                 Next level of care provider has access to [facility] Link:yes  Safety Planning and Suicide Prevention discussed: Yes,  mother  Darolyn Mayer [phone]     Has patient been referred to the Quitline?: Patient refused referral for treatment  Patient has been referred for addiction treatment: Patient refused referral for treatment.  Kamerin Axford, Donia SAUNDERS, LCSWA 07/01/2024, 7:52 AM

## 2024-07-03 ENCOUNTER — Emergency Department (HOSPITAL_COMMUNITY): Admission: EM | Admit: 2024-07-03 | Discharge: 2024-07-03 | Payer: MEDICAID

## 2024-07-03 DIAGNOSIS — Z79899 Other long term (current) drug therapy: Secondary | ICD-10-CM | POA: Diagnosis not present

## 2024-07-03 DIAGNOSIS — Z91199 Patient's noncompliance with other medical treatment and regimen due to unspecified reason: Secondary | ICD-10-CM | POA: Insufficient documentation

## 2024-07-03 DIAGNOSIS — Z008 Encounter for other general examination: Secondary | ICD-10-CM

## 2024-07-03 DIAGNOSIS — Z8659 Personal history of other mental and behavioral disorders: Secondary | ICD-10-CM | POA: Diagnosis not present

## 2024-07-03 DIAGNOSIS — Z91148 Patient's other noncompliance with medication regimen for other reason: Secondary | ICD-10-CM

## 2024-07-03 MED ORDER — AMLODIPINE BESYLATE 5 MG PO TABS
5.0000 mg | ORAL_TABLET | Freq: Once | ORAL | Status: AC
  Administered 2024-07-03: 5 mg via ORAL
  Filled 2024-07-03: qty 1

## 2024-07-03 MED ORDER — FLUOXETINE HCL 20 MG PO CAPS
20.0000 mg | ORAL_CAPSULE | Freq: Once | ORAL | Status: AC
  Administered 2024-07-03: 20 mg via ORAL
  Filled 2024-07-03: qty 1

## 2024-07-03 NOTE — ED Provider Notes (Signed)
 [place] EMERGENCY DEPARTMENT AT [facility] Provider Note   CSN: [id number] Arrival date & time: 07/03/24  1414     Patient presents with: Medical Clearance   Luian Schumpert is a 23 y.o. male.   23 year old male brought in by law enforcement for medical clearance.  He was sweaty when he got to the jail and per them the nurse at the jail was worried that he was on drugs.  Patient states he did not take his normal medications today.  He has no complaints and feels fine.  Say initially he was not as talkative but has become more talkative in the last 30 minutes.  Patient denies any symptoms or concerns.        Prior to Admission medications   Medication Sig Start Date End Date Taking? Authorizing Provider  amLODipine  (NORVASC ) 5 MG tablet Take 1 tablet (5 mg total) by mouth daily. 07/01/24 07/31/24  Zouev, Dmitri, MD  FLUoxetine  (PROZAC ) 20 MG capsule Take 1 capsule (20 mg total) by mouth daily. 07/02/24   Zouev, Dmitri, MD  hydrOXYzine  (ATARAX ) 25 MG tablet Take 1 tablet (25 mg total) by mouth 3 (three) times daily as needed for anxiety. 07/01/24   Zouev, Dmitri, MD  melatonin 5 MG TABS Take 1 tablet (5 mg total) by mouth at bedtime as needed. 07/01/24   Zouev, Dmitri, MD  nicotine  polacrilex (NICORETTE ) 2 MG gum Take 1 each (2 mg total) by mouth as needed for smoking cessation. 07/01/24   Zouev, Dmitri, MD    Allergies: Patient has no known allergies.    Review of Systems  Constitutional:  Negative for chills and fever.  HENT:  Negative for ear pain and sore throat.   Eyes:  Negative for pain and visual disturbance.  Respiratory:  Negative for cough and shortness of breath.   Cardiovascular:  Negative for chest pain and palpitations.  Gastrointestinal:  Negative for abdominal pain and vomiting.  Genitourinary:  Negative for dysuria and hematuria.  Musculoskeletal:  Negative for arthralgias and back pain.  Skin:  Negative for color change and rash.  Neurological:  Negative for  seizures and syncope.  All other systems reviewed and are negative.   Updated Vital Signs BP (!) 138/99 (BP Location: Left Arm)   Pulse (!) 117   Temp 99.1 F (37.3 C) (Oral)   Resp 18   SpO2 94%   Physical Exam Vitals and nursing note reviewed.  Constitutional:      General: He is not in acute distress.    Appearance: Normal appearance. He is well-developed. He is not ill-appearing.  HENT:     Head: Normocephalic and atraumatic.  Eyes:     Conjunctiva/sclera: Conjunctivae normal.  Cardiovascular:     Rate and Rhythm: Normal rate and regular rhythm.     Heart sounds: No murmur heard. Pulmonary:     Effort: Pulmonary effort is normal. No respiratory distress.     Breath sounds: Normal breath sounds.  Abdominal:     Palpations: Abdomen is soft.     Tenderness: There is no abdominal tenderness.  Musculoskeletal:        General: No swelling.     Cervical back: Neck supple.  Skin:    General: Skin is warm and dry.     Capillary Refill: Capillary refill takes less than 2 seconds.  Neurological:     Mental Status: He is alert.  Psychiatric:        Mood and Affect: Mood normal.     (  all labs ordered are listed, but only abnormal results are displayed) Labs Reviewed - No data to display  EKG: None  Radiology: No results found.   Procedures   Medications Ordered in the ED  amLODipine  (NORVASC ) tablet 5 mg (5 mg Oral Given 07/03/24 1456)  FLUoxetine  (PROZAC ) capsule 20 mg (20 mg Oral Given 07/03/24 1456)                                    Medical Decision Making Patient appears well, he is talkative with stable vital signs.  Did not take his normal medications today.  Will give him a dose of his amlodipine  as well as his Prozac .  He is cleared for incarceration.  Advised to stay on his medications as prescribed.  1 4 show feels comfortable with plan for discharge to their custody  Problems Addressed: H/O medication noncompliance: undiagnosed new problem with  uncertain prognosis History of depression: chronic illness or injury Medical clearance for incarceration: self-limited or minor problem  Amount and/or Complexity of Data Reviewed External Data Reviewed: notes.    Details: Prior ED and hospital records reviewed and patient recently admitted earlier this month to behavioral health for ongoing psychiatry issues  Risk OTC drugs. Prescription drug management. Diagnosis or treatment significantly limited by social determinants of health.   Social determinants of health: Patient has history of drug abuse and psychiatric disorders  Final diagnoses:  Medical clearance for incarceration  History of depression  H/O medication noncompliance    ED Discharge Orders     None          Gennaro Duwaine CROME, DO 07/03/24 1600

## 2024-07-03 NOTE — ED Triage Notes (Signed)
 Pt arrives with [employer] after being refused by jail nurse. PD report he was asked to leave a property and would not respond so they were called. EMS checked out at jail, BP was a little elevated but eventually pt stated he had not taken his bp meds today. Pt gives name, age, location but does not answer year. Denies drugs and alcohol. Making intentional eye contact

## 2024-07-03 NOTE — Discharge Instructions (Signed)
 Take your home medications as prescribed.  Patient is medically cleared for incarceration

## 2024-07-21 ENCOUNTER — Ambulatory Visit (HOSPITAL_COMMUNITY): Payer: MEDICAID | Admitting: Psychiatry

## 2024-08-05 ENCOUNTER — Ambulatory Visit (HOSPITAL_COMMUNITY): Payer: MEDICAID | Admitting: Physician Assistant

## 2024-08-17 ENCOUNTER — Encounter (HOSPITAL_COMMUNITY): Payer: Self-pay

## 2024-08-17 ENCOUNTER — Ambulatory Visit (HOSPITAL_COMMUNITY): Payer: MEDICAID | Admitting: Psychiatry

## 2024-08-20 ENCOUNTER — Ambulatory Visit (HOSPITAL_COMMUNITY): Payer: MEDICAID | Admitting: Psychiatry
